# Patient Record
Sex: Female | Born: 1945 | Race: White | Hispanic: No | Marital: Married | Smoking: Former smoker
Health system: Southern US, Community
[De-identification: ages and names within clinical notes are randomized; demographics above are authoritative.]

## PROBLEM LIST (undated history)

## (undated) DIAGNOSIS — Z9889 Other specified postprocedural states: Secondary | ICD-10-CM

## (undated) DIAGNOSIS — F329 Major depressive disorder, single episode, unspecified: Secondary | ICD-10-CM

## (undated) DIAGNOSIS — E785 Hyperlipidemia, unspecified: Secondary | ICD-10-CM

## (undated) DIAGNOSIS — M7071 Other bursitis of hip, right hip: Secondary | ICD-10-CM

## (undated) DIAGNOSIS — D649 Anemia, unspecified: Secondary | ICD-10-CM

## (undated) DIAGNOSIS — N189 Chronic kidney disease, unspecified: Secondary | ICD-10-CM

## (undated) DIAGNOSIS — M722 Plantar fascial fibromatosis: Secondary | ICD-10-CM

## (undated) DIAGNOSIS — R112 Nausea with vomiting, unspecified: Secondary | ICD-10-CM

## (undated) DIAGNOSIS — M7072 Other bursitis of hip, left hip: Secondary | ICD-10-CM

## (undated) DIAGNOSIS — F32A Depression, unspecified: Secondary | ICD-10-CM

## (undated) DIAGNOSIS — H269 Unspecified cataract: Secondary | ICD-10-CM

## (undated) DIAGNOSIS — G709 Myoneural disorder, unspecified: Secondary | ICD-10-CM

## (undated) DIAGNOSIS — F419 Anxiety disorder, unspecified: Secondary | ICD-10-CM

## (undated) DIAGNOSIS — M1990 Unspecified osteoarthritis, unspecified site: Secondary | ICD-10-CM

## (undated) DIAGNOSIS — I10 Essential (primary) hypertension: Secondary | ICD-10-CM

## (undated) DIAGNOSIS — G4730 Sleep apnea, unspecified: Secondary | ICD-10-CM

## (undated) DIAGNOSIS — T7840XA Allergy, unspecified, initial encounter: Secondary | ICD-10-CM

## (undated) HISTORY — PX: SPINE SURGERY: SHX786

## (undated) HISTORY — DX: Chronic kidney disease, unspecified: N18.9

## (undated) HISTORY — DX: Other bursitis of hip, right hip: M70.72

## (undated) HISTORY — DX: Other bursitis of hip, right hip: M70.71

## (undated) HISTORY — DX: Hyperlipidemia, unspecified: E78.5

## (undated) HISTORY — DX: Depression, unspecified: F32.A

## (undated) HISTORY — DX: Unspecified cataract: H26.9

## (undated) HISTORY — PX: TONSILLECTOMY: SUR1361

## (undated) HISTORY — DX: Plantar fascial fibromatosis: M72.2

## (undated) HISTORY — PX: TUBAL LIGATION: SHX77

## (undated) HISTORY — DX: Myoneural disorder, unspecified: G70.9

## (undated) HISTORY — DX: Anemia, unspecified: D64.9

## (undated) HISTORY — DX: Sleep apnea, unspecified: G47.30

## (undated) HISTORY — DX: Unspecified osteoarthritis, unspecified site: M19.90

## (undated) HISTORY — PX: EYE SURGERY: SHX253

## (undated) HISTORY — DX: Essential (primary) hypertension: I10

## (undated) HISTORY — DX: Allergy, unspecified, initial encounter: T78.40XA

## (undated) HISTORY — DX: Anxiety disorder, unspecified: F41.9

---
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
## 1978-05-25 HISTORY — PX: BREAST SURGERY: SHX581

## 2007-05-26 LAB — HM COLONOSCOPY

## 2008-05-25 DIAGNOSIS — C801 Malignant (primary) neoplasm, unspecified: Secondary | ICD-10-CM

## 2008-05-25 HISTORY — PX: MELANOMA EXCISION: SHX5266

## 2008-05-25 HISTORY — DX: Malignant (primary) neoplasm, unspecified: C80.1

## 2010-04-08 LAB — HM MAMMOGRAPHY

## 2010-06-04 LAB — HM MAMMOGRAPHY: HM Mammogram: NORMAL (ref 0–4)

## 2010-06-16 LAB — HM DEXA SCAN

## 2011-12-16 LAB — HM MAMMOGRAPHY

## 2012-02-15 LAB — HM COLONOSCOPY

## 2012-05-25 HISTORY — PX: CERVICAL SPINE SURGERY: SHX589

## 2013-03-30 LAB — TSH: TSH: 2.2 (ref <=5.90)

## 2013-05-04 HISTORY — PX: BACK SURGERY: SHX140

## 2015-04-08 DIAGNOSIS — G4733 Obstructive sleep apnea (adult) (pediatric): Secondary | ICD-10-CM | POA: Insufficient documentation

## 2015-05-08 HISTORY — PX: CATARACT EXTRACTION: SUR2

## 2015-05-26 HISTORY — PX: CATARACT EXTRACTION, BILATERAL: SHX1313

## 2015-06-14 HISTORY — PX: CATARACT EXTRACTION: SUR2

## 2015-10-18 LAB — HM MAMMOGRAPHY

## 2016-10-29 LAB — HM MAMMOGRAPHY

## 2016-12-03 DIAGNOSIS — N183 Chronic kidney disease, stage 3 unspecified: Secondary | ICD-10-CM | POA: Insufficient documentation

## 2016-12-17 DIAGNOSIS — Z01810 Encounter for preprocedural cardiovascular examination: Secondary | ICD-10-CM | POA: Insufficient documentation

## 2016-12-17 DIAGNOSIS — R7303 Prediabetes: Secondary | ICD-10-CM | POA: Insufficient documentation

## 2016-12-17 HISTORY — DX: Encounter for preprocedural cardiovascular examination: Z01.810

## 2017-04-12 DIAGNOSIS — E663 Overweight: Secondary | ICD-10-CM | POA: Insufficient documentation

## 2017-09-29 DIAGNOSIS — N182 Chronic kidney disease, stage 2 (mild): Secondary | ICD-10-CM | POA: Insufficient documentation

## 2017-09-29 DIAGNOSIS — R809 Proteinuria, unspecified: Secondary | ICD-10-CM | POA: Insufficient documentation

## 2018-01-06 LAB — HM MAMMOGRAPHY

## 2018-03-16 DIAGNOSIS — Z23 Encounter for immunization: Secondary | ICD-10-CM | POA: Insufficient documentation

## 2018-07-24 DIAGNOSIS — M5136 Other intervertebral disc degeneration, lumbar region: Secondary | ICD-10-CM | POA: Insufficient documentation

## 2019-01-03 DIAGNOSIS — M792 Neuralgia and neuritis, unspecified: Secondary | ICD-10-CM | POA: Insufficient documentation

## 2019-01-03 DIAGNOSIS — M7072 Other bursitis of hip, left hip: Secondary | ICD-10-CM | POA: Insufficient documentation

## 2019-01-03 DIAGNOSIS — N186 End stage renal disease: Secondary | ICD-10-CM | POA: Insufficient documentation

## 2019-01-20 DIAGNOSIS — M48061 Spinal stenosis, lumbar region without neurogenic claudication: Secondary | ICD-10-CM | POA: Insufficient documentation

## 2019-03-14 ENCOUNTER — Other Ambulatory Visit: Payer: Self-pay | Admitting: Orthopedic Surgery

## 2019-03-14 DIAGNOSIS — M5136 Other intervertebral disc degeneration, lumbar region: Secondary | ICD-10-CM

## 2019-03-17 LAB — BASIC METABOLIC PANEL
CO2: 24 — AB (ref 13–22)
Chloride: 106 (ref 99–108)
Creatinine: 0.9 (ref <=1.1)
Glucose: 93
Potassium: 4.9 (ref 3.4–5.3)
Sodium: 143 (ref 137–147)

## 2019-03-17 LAB — HEMOGLOBIN A1C: Hemoglobin A1C: 5.8

## 2019-03-17 LAB — LIPID PANEL
Cholesterol: 169 (ref 0–200)
HDL: 65 (ref 35–70)
LDL Cholesterol: 84
Triglycerides: 116 (ref 40–160)

## 2019-03-17 LAB — HEPATIC FUNCTION PANEL
ALT: 17 (ref 7–35)
AST: 20 (ref 13–35)

## 2019-03-17 LAB — COMPREHENSIVE METABOLIC PANEL
Albumin: 4.3 (ref 3.5–5.0)
Calcium: 9.3 (ref 8.7–10.7)
GFR calc Af Amer: 75
GFR calc non Af Amer: 65
Globulin: 2.2

## 2019-03-17 LAB — MICROALBUMIN, URINE: Microalb, Ur: 3.3

## 2019-03-17 LAB — VITAMIN D 25 HYDROXY (VIT D DEFICIENCY, FRACTURES): Vit D, 25-Hydroxy: 29.8

## 2019-03-20 LAB — HM MAMMOGRAPHY: HM Mammogram: NORMAL (ref 0–4)

## 2019-03-21 DIAGNOSIS — M899 Disorder of bone, unspecified: Secondary | ICD-10-CM | POA: Insufficient documentation

## 2019-03-21 DIAGNOSIS — R7301 Impaired fasting glucose: Secondary | ICD-10-CM | POA: Insufficient documentation

## 2019-03-21 DIAGNOSIS — R7309 Other abnormal glucose: Secondary | ICD-10-CM | POA: Insufficient documentation

## 2019-03-21 DIAGNOSIS — M5090 Cervical disc disorder, unspecified, unspecified cervical region: Secondary | ICD-10-CM | POA: Insufficient documentation

## 2019-03-21 DIAGNOSIS — F32A Depression, unspecified: Secondary | ICD-10-CM | POA: Insufficient documentation

## 2019-03-21 DIAGNOSIS — E785 Hyperlipidemia, unspecified: Secondary | ICD-10-CM | POA: Insufficient documentation

## 2019-03-21 DIAGNOSIS — M26609 Unspecified temporomandibular joint disorder, unspecified side: Secondary | ICD-10-CM | POA: Insufficient documentation

## 2019-03-21 DIAGNOSIS — C4370 Malignant melanoma of unspecified lower limb, including hip: Secondary | ICD-10-CM | POA: Insufficient documentation

## 2019-03-21 DIAGNOSIS — R9439 Abnormal result of other cardiovascular function study: Secondary | ICD-10-CM | POA: Insufficient documentation

## 2019-03-21 DIAGNOSIS — R200 Anesthesia of skin: Secondary | ICD-10-CM | POA: Insufficient documentation

## 2019-03-21 DIAGNOSIS — E559 Vitamin D deficiency, unspecified: Secondary | ICD-10-CM | POA: Insufficient documentation

## 2019-03-21 DIAGNOSIS — R928 Other abnormal and inconclusive findings on diagnostic imaging of breast: Secondary | ICD-10-CM | POA: Insufficient documentation

## 2019-03-21 DIAGNOSIS — L821 Other seborrheic keratosis: Secondary | ICD-10-CM | POA: Insufficient documentation

## 2019-03-21 DIAGNOSIS — F329 Major depressive disorder, single episode, unspecified: Secondary | ICD-10-CM | POA: Insufficient documentation

## 2019-03-21 DIAGNOSIS — I8390 Asymptomatic varicose veins of unspecified lower extremity: Secondary | ICD-10-CM | POA: Insufficient documentation

## 2019-03-21 DIAGNOSIS — M8580 Other specified disorders of bone density and structure, unspecified site: Secondary | ICD-10-CM | POA: Insufficient documentation

## 2019-03-21 DIAGNOSIS — M519 Unspecified thoracic, thoracolumbar and lumbosacral intervertebral disc disorder: Secondary | ICD-10-CM | POA: Insufficient documentation

## 2019-04-04 ENCOUNTER — Ambulatory Visit
Admission: RE | Admit: 2019-04-04 | Discharge: 2019-04-04 | Disposition: A | Discharge status: Home / Self Care | Payer: Self-pay | Source: Ambulatory Visit | Attending: Orthopedic Surgery | Admitting: Orthopedic Surgery

## 2019-04-04 ENCOUNTER — Other Ambulatory Visit: Payer: Self-pay | Admitting: Orthopedic Surgery

## 2019-04-04 DIAGNOSIS — M5136 Other intervertebral disc degeneration, lumbar region: Secondary | ICD-10-CM

## 2019-04-05 ENCOUNTER — Other Ambulatory Visit: Payer: Self-pay

## 2019-04-05 ENCOUNTER — Ambulatory Visit
Admission: RE | Admit: 2019-04-05 | Discharge: 2019-04-05 | Disposition: A | Discharge status: Home / Self Care | Payer: Medicare Other | Source: Ambulatory Visit | Attending: Orthopedic Surgery | Admitting: Orthopedic Surgery

## 2019-04-05 DIAGNOSIS — M5136 Other intervertebral disc degeneration, lumbar region: Secondary | ICD-10-CM

## 2019-04-05 MED ORDER — IOPAMIDOL (ISOVUE-M 200) INJECTION 41%
1.0000 mL | Freq: Once | INTRAMUSCULAR | Status: AC
  Administered 2019-04-05: 1 mL

## 2019-04-05 MED ORDER — VANCOMYCIN HCL IN DEXTROSE 1-5 GM/200ML-% IV SOLN
1000.0000 mg | Freq: Once | INTRAVENOUS | Status: AC
  Administered 2019-04-05: 1000 mg via INTRAVENOUS

## 2019-04-05 NOTE — Discharge Instructions (Signed)

## 2019-09-27 ENCOUNTER — Other Ambulatory Visit: Payer: Self-pay

## 2019-09-27 ENCOUNTER — Ambulatory Visit (INDEPENDENT_AMBULATORY_CARE_PROVIDER_SITE_OTHER): Payer: Medicare Other | Admitting: Internal Medicine

## 2019-09-27 ENCOUNTER — Encounter: Payer: Self-pay | Admitting: Internal Medicine

## 2019-09-27 VITALS — BP 122/80 | HR 69 | Temp 98.3°F | Ht 65.0 in | Wt 182.0 lb

## 2019-09-27 DIAGNOSIS — E6609 Other obesity due to excess calories: Secondary | ICD-10-CM

## 2019-09-27 DIAGNOSIS — E7800 Pure hypercholesterolemia, unspecified: Secondary | ICD-10-CM

## 2019-09-27 DIAGNOSIS — R7303 Prediabetes: Secondary | ICD-10-CM

## 2019-09-27 DIAGNOSIS — M8588 Other specified disorders of bone density and structure, other site: Secondary | ICD-10-CM

## 2019-09-27 DIAGNOSIS — E559 Vitamin D deficiency, unspecified: Secondary | ICD-10-CM

## 2019-09-27 DIAGNOSIS — M48062 Spinal stenosis, lumbar region with neurogenic claudication: Secondary | ICD-10-CM | POA: Diagnosis not present

## 2019-09-27 DIAGNOSIS — L821 Other seborrheic keratosis: Secondary | ICD-10-CM

## 2019-09-27 DIAGNOSIS — M85859 Other specified disorders of bone density and structure, unspecified thigh: Secondary | ICD-10-CM

## 2019-09-27 DIAGNOSIS — Z85820 Personal history of malignant melanoma of skin: Secondary | ICD-10-CM

## 2019-09-27 DIAGNOSIS — Z6830 Body mass index (BMI) 30.0-30.9, adult: Secondary | ICD-10-CM

## 2019-09-27 DIAGNOSIS — C4370 Malignant melanoma of unspecified lower limb, including hip: Secondary | ICD-10-CM

## 2019-09-27 DIAGNOSIS — Z1231 Encounter for screening mammogram for malignant neoplasm of breast: Secondary | ICD-10-CM

## 2019-09-27 DIAGNOSIS — Z9889 Other specified postprocedural states: Secondary | ICD-10-CM

## 2019-09-27 DIAGNOSIS — G4733 Obstructive sleep apnea (adult) (pediatric): Secondary | ICD-10-CM

## 2019-09-27 DIAGNOSIS — Z9989 Dependence on other enabling machines and devices: Secondary | ICD-10-CM

## 2019-09-27 NOTE — Patient Instructions (Signed)
Seborrheic Keratosis °A seborrheic keratosis is a common, noncancerous (benign) skin growth. These growths are velvety, waxy, rough, tan, brown, or black spots that appear on the skin. These skin growths can be flat or raised, and scaly. °What are the causes? °The cause of this condition is not known. °What increases the risk? °You are more likely to develop this condition if you: °· Have a family history of seborrheic keratosis. °· Are 50 or older. °· Are pregnant. °· Have had estrogen replacement therapy. °What are the signs or symptoms? °Symptoms of this condition include growths on the face, chest, shoulders, back, or other areas. These growths: °· Are usually painless, but may become irritated and itchy. °· Can be yellow, brown, black, or other colors. °· Are slightly raised or have a flat surface. °· Are sometimes rough or wart-like in texture. °· Are often velvety or waxy on the surface. °· Are round or oval-shaped. °· Often occur in groups, but may occur as a single growth. °How is this diagnosed? °This condition is diagnosed with a medical history and physical exam. °· A sample of the growth may be tested (skin biopsy). °· You may need to see a skin specialist (dermatologist). °How is this treated? °Treatment is not usually needed for this condition, unless the growths are irritated or bleed often. °· You may also choose to have the growths removed if you do not like their appearance. °? Most commonly, these growths are treated with a procedure in which liquid nitrogen is applied to "freeze" off the growth (cryosurgery). °? They may also be burned off with electricity (electrocautery) or removed by scraping (curettage). °Follow these instructions at home: °· Watch your growth for any changes. °· Keep all follow-up visits as told by your health care provider. This is important. °· Do not scratch or pick at the growth or growths. This can cause them to become irritated or infected. °Contact a health care  provider if: °· You suddenly have many new growths. °· Your growth bleeds, itches, or hurts. °· Your growth suddenly becomes larger or changes color. °Summary °· A seborrheic keratosis is a common, noncancerous (benign) skin growth. °· Treatment is not usually needed for this condition, unless the growths are irritated or bleed often. °· Watch your growth for any changes. °· Contact a health care provider if you suddenly have many new growths or your growth suddenly becomes larger or changes color. °· Keep all follow-up visits as told by your health care provider. This is important. °This information is not intended to replace advice given to you by your health care provider. Make sure you discuss any questions you have with your health care provider. °Document Revised: 09/23/2017 Document Reviewed: 09/23/2017 °Elsevier Patient Education © 2020 Elsevier Inc. ° °

## 2019-09-27 NOTE — Progress Notes (Signed)
This visit occurred during the SARS-CoV-2 public health emergency.  Safety protocols were in place, including screening questions prior to the visit, additional usage of staff PPE, and extensive cleaning of exam room while observing appropriate contact time as indicated for disinfecting solutions.  Subjective:     Patient ID: Jenny [name] , female    DOB: [birth year] , 74 y.o.   MRN: [id number]   Chief Complaint  Patient presents with  . Establish Care  . [place]    wants it to be looked at    HPI  She is here today to establish primary care.  She reports she has been in [place] for almost 2 years. She states she came just prior to [place]. She moved from [place], [place].  She is married, 23 years. She is a retired Education officer, museum. She reports h/o stage 3 CKD, high cholesterol and sleep apnea. She reports compliance with CPAP, uses [employer] for DME supplies. She reports h/o melanoma, she has suspicious skin lesion and wants to have this checked.     Past Medical History:  Diagnosis Date  . Anxiety   . Arthritis   . Bursitis of both hips   . Cancer ([place]) 2010   Melanoma Left Arm  . Cataract [id number]  . Chronic kidney disease   . Depression   . Hyperlipidemia   . Hypertension   . Neuromuscular disorder ([place])    Peripheral Nueropathy ( Left Side)  . Plantar fasciitis   . Sleep apnea      Family History  Problem Relation Age of Onset  . COPD Mother   . Psychosis Mother   . COPD Father   . Depression Father   . Hypertension Father   . Lupus Daughter   . Depression Paternal Aunt   . Heart disease Paternal Grandmother      Current Outpatient Medications:  .  Acetaminophen (TYLENOL) 325 MG CAPS, Tylenol 325 mg capsule  Take by oral route., Disp: , Rfl:  .  Alpha-D-Galactosidase (BEANO) TABS, Beano tablet  Take by oral route., Disp: , Rfl:  .  atorvastatin (LIPITOR) 10 MG tablet, Lipitor 10 mg tablet  Take 1 tablet every day by oral route., Disp: , Rfl:  .  Calcium  Carbonate-Vitamin D 600-400 MG-UNIT tablet, 2 tablets 2 (two) times a day, Disp: , Rfl:  .  cholecalciferol (DELTA D3) 10 MCG (400 UNIT) TABS tablet, Take by mouth., Disp: , Rfl:  .  DENTA 5000 PLUS 1.1 % CREA dental cream, Place 1.1 % onto teeth one time only at 6 PM., Disp: , Rfl:  .  docusate sodium (COLACE) 100 MG capsule, Colace, Disp: , Rfl:  .  lactase (LACTAID) 3000 units tablet, Take by mouth., Disp: , Rfl:  .  PARoxetine (PAXIL) 30 MG tablet, Paxil 30 mg tablet  Take 1 tablet every day by oral route., Disp: , Rfl:    Allergies  Allergen Reactions  . Clindamycin Hives, Itching and Rash  . Prochlorperazine Other (See Comments)    Rapid heart rate, insomina, made her "super hyper"  . Diclofenac   . Ibandronic Acid   . Penicillins   . Raloxifene   . Diclofenac Sodium Other (See Comments)    Turns feces white  . Penicillin V Potassium Rash     Review of Systems  Constitutional: Negative.   HENT: Negative.   Eyes: Negative.   Respiratory: Negative.   Cardiovascular: Negative.   Endocrine: Negative.   Genitourinary: Negative.   Musculoskeletal: Positive for  back pain.  Allergic/Immunologic: Negative.   Neurological: Negative.   Hematological: Negative.   Psychiatric/Behavioral: Negative.      Today's Vitals   09/27/19 1113  BP: 122/80  Pulse: 69  Temp: 98.3 F (36.8 C)  Weight: 182 lb (82.6 kg)  Height: '5\' 5"'  (1.651 m)  PainSc: 0-No pain   Body mass index is 30.29 kg/m.   Objective:  Physical Exam Vitals and nursing note reviewed.  Constitutional:      Appearance: Normal appearance.  HENT:     Head: Normocephalic and atraumatic.  Cardiovascular:     Rate and Rhythm: Normal rate and regular rhythm.     Heart sounds: Normal heart sounds.  Pulmonary:     Effort: Pulmonary effort is normal.     Breath sounds: Normal breath sounds.  Skin:    General: Skin is warm.     Comments: Hyperpigmented, irregular, plaque lesion on back.   Neurological:      General: No focal deficit present.     Mental Status: She is alert.  Psychiatric:        Mood and Affect: Mood normal.        Behavior: Behavior normal.         Assessment And Plan:     1. Seborrheic keratosis  I will refer her to Derm for further evaluation, surgical excision. She is in agreement with her treatment plan. She is concerned b/c h/o melanoma.   - Ambulatory referral to Dermatology  2. Spinal stenosis of lumbar region with neurogenic claudication  Chronic, yet stable. She has had physical therapy. She is now followed by Dr. Rolena Infante at [facility].   3. Pure hypercholesterolemia  Chronic, labs from previous PCP reviewed in full detail.  She will continue with current meds for now.  I will check non-fasting lipid panel today. She is encouraged to avoid fried foods and to gradually increase daily activity as tolerated.   - Lipid panel - CBC no Diff - TSH  4. Prediabetes  Again, her previous labs were reviewed in full detail. I will have most recent labs abstracted into her chart.  HER A1C HAS BEEN ELEVATED IN THE PAST. I WILL CHECK AN A1C, BMET TODAY. SHE WAS ENCOURAGED TO AVOID SUGARY BEVERAGES AND PROCESSED FOODS INCLUDNG BREADS, RICE AND PASTA.  - Hemoglobin A1c - CMP14+EGFR  5. OSA on CPAP  Chronic, she reports compliance with CPAP. She states she wears faithfully nightly and has noticed benefit from the use of CPAP.   6. Osteopenia of hip, unspecified laterality  Previous dexa scan performed in 2019, she agrees to repeat testing. Importance of calcium/vitamin D supplementation and weight-bearing exercises was discussed.   - DG Bone Density; Future  7. Vitamin D deficiency disease  I WILL CHECK A VIT D LEVEL AND SUPPLEMENT AS NEEDED.  ALSO ENCOURAGED TO SPEND 15 MINUTES IN THE SUN DAILY.  - Vitamin D (25 hydroxy)  8. Class 1 obesity due to excess calories with serious comorbidity and body mass index (BMI) of 30.0 to 30.9 in adult  Her BMI is stable  for her demographic. Encouraged to work up to exercising 30 minutes five days per week.   9. History of melanoma excision  She agrees to [facility] referral for full body check.   - Ambulatory referral to Dermatology  10. Encounter for screening mammogram for malignant neoplasm of breast  - MM Digital Screening; Future  11. Other specified disorders of bone density and structure, other site   -  DG Bone Density; Future   Maximino Greenland, MD    THE PATIENT IS ENCOURAGED TO PRACTICE SOCIAL DISTANCING DUE TO THE COVID-19 PANDEMIC.

## 2019-09-28 LAB — CMP14+EGFR
ALT: 17 IU/L (ref 0–32)
AST: 23 IU/L (ref 0–40)
Albumin/Globulin Ratio: 1.7 (ref 1.2–2.2)
Albumin: 4.5 g/dL (ref 3.7–4.7)
Alkaline Phosphatase: 85 IU/L (ref 39–117)
BUN/Creatinine Ratio: 27 (ref 12–28)
BUN: 24 mg/dL (ref 8–27)
Bilirubin Total: 0.3 mg/dL (ref 0.0–1.2)
CO2: 26 mmol/L (ref 20–29)
Calcium: 9.5 mg/dL (ref 8.7–10.3)
Chloride: 103 mmol/L (ref 96–106)
Creatinine, Ser: 0.9 mg/dL (ref 0.57–1.00)
GFR calc Af Amer: 73 mL/min/{1.73_m2} (ref >59)
GFR calc non Af Amer: 64 mL/min/{1.73_m2} (ref >59)
Globulin, Total: 2.6 g/dL (ref 1.5–4.5)
Glucose: 83 mg/dL (ref 65–99)
Potassium: 4.4 mmol/L (ref 3.5–5.2)
Sodium: 141 mmol/L (ref 134–144)
Total Protein: 7.1 g/dL (ref 6.0–8.5)

## 2019-09-28 LAB — LIPID PANEL
Chol/HDL Ratio: 2.7 ratio (ref 0.0–4.4)
Cholesterol, Total: 180 mg/dL (ref 100–199)
HDL: 67 mg/dL (ref >39)
LDL Chol Calc (NIH): 88 mg/dL (ref 0–99)
Triglycerides: 145 mg/dL (ref 0–149)
VLDL Cholesterol Cal: 25 mg/dL (ref 5–40)

## 2019-09-28 LAB — CBC
Hematocrit: 39.7 % (ref 34.0–46.6)
Hemoglobin: 13.3 g/dL (ref 11.1–15.9)
MCH: 31.1 pg (ref 26.6–33.0)
MCHC: 33.5 g/dL (ref 31.5–35.7)
MCV: 93 fL (ref 79–97)
Platelets: 256 10*3/uL (ref 150–450)
RBC: 4.27 x10E6/uL (ref 3.77–5.28)
RDW: 12.9 % (ref 11.7–15.4)
WBC: 6.1 10*3/uL (ref 3.4–10.8)

## 2019-09-28 LAB — VITAMIN D 25 HYDROXY (VIT D DEFICIENCY, FRACTURES): Vit D, 25-Hydroxy: 33.6 ng/mL (ref 30.0–100.0)

## 2019-09-28 LAB — TSH: TSH: 2.47 u[IU]/mL (ref 0.450–4.500)

## 2019-09-28 LAB — HEMOGLOBIN A1C
Est. average glucose Bld gHb Est-mCnc: 123 mg/dL
Hgb A1c MFr Bld: 5.9 % — ABNORMAL HIGH (ref 4.8–5.6)

## 2020-02-20 ENCOUNTER — Other Ambulatory Visit: Payer: Self-pay

## 2020-02-20 ENCOUNTER — Ambulatory Visit (INDEPENDENT_AMBULATORY_CARE_PROVIDER_SITE_OTHER): Payer: Medicare Other | Admitting: Internal Medicine

## 2020-02-20 ENCOUNTER — Encounter: Payer: Self-pay | Admitting: Internal Medicine

## 2020-02-20 VITALS — BP 112/74 | HR 75 | Temp 98.1°F | Ht 65.0 in | Wt 180.2 lb

## 2020-02-20 DIAGNOSIS — Z01818 Encounter for other preprocedural examination: Secondary | ICD-10-CM

## 2020-02-20 DIAGNOSIS — N182 Chronic kidney disease, stage 2 (mild): Secondary | ICD-10-CM | POA: Diagnosis not present

## 2020-02-20 DIAGNOSIS — Z9989 Dependence on other enabling machines and devices: Secondary | ICD-10-CM

## 2020-02-20 DIAGNOSIS — R0602 Shortness of breath: Secondary | ICD-10-CM

## 2020-02-20 DIAGNOSIS — M48062 Spinal stenosis, lumbar region with neurogenic claudication: Secondary | ICD-10-CM | POA: Diagnosis not present

## 2020-02-20 DIAGNOSIS — G4733 Obstructive sleep apnea (adult) (pediatric): Secondary | ICD-10-CM

## 2020-02-20 LAB — CBC
Hematocrit: 39.8 % (ref 34.0–46.6)
Hemoglobin: 13.1 g/dL (ref 11.1–15.9)
MCH: 30.3 pg (ref 26.6–33.0)
MCHC: 32.9 g/dL (ref 31.5–35.7)
MCV: 92 fL (ref 79–97)
Platelets: 240 10*3/uL (ref 150–450)
RBC: 4.33 x10E6/uL (ref 3.77–5.28)
RDW: 12.9 % (ref 11.7–15.4)
WBC: 6.5 10*3/uL (ref 3.4–10.8)

## 2020-02-20 MED ORDER — PAROXETINE HCL 30 MG PO TABS: ORAL_TABLET | ORAL | 1 refills | Status: DC

## 2020-02-20 MED ORDER — DENTA 5000 PLUS 1.1 % DT CREA: 1.0000 "application " | TOPICAL_CREAM | Freq: Every day | DENTAL | 3 refills | Status: DC

## 2020-02-20 MED ORDER — ATORVASTATIN CALCIUM 10 MG PO TABS: ORAL_TABLET | ORAL | 1 refills | Status: DC

## 2020-02-20 NOTE — Progress Notes (Signed)
I,Katawbba Wiggins,acting as a Education administrator for Maximino Greenland, MD.,have documented all relevant documentation on the behalf of Maximino Greenland, MD,as directed by  Maximino Greenland, MD while in the presence of Maximino Greenland, MD.  This visit occurred during the SARS-CoV-2 public health emergency.  Safety protocols were in place, including screening questions prior to the visit, additional usage of staff PPE, and extensive cleaning of exam room while observing appropriate contact time as indicated for disinfecting solutions.  Subjective:     Patient ID: Jenny [name] , female    DOB: Jun 17, 1945 , 74 y.o.   MRN: [id number]   Chief Complaint  Patient presents with  . Medical Clearance    HPI  The patient is here today for evaluation of surgical clearance.  She is followed by Dr. Melina Schools for spinal stenosis. She is hoping the surgical procedure will lessen her sx of chronic back pain that radiates down her left leg.   She does not have stairs in her home. She does admit to shortness of breath when walking to her mailbox. She denies chest pain. Unable to recall if she has had cardiac workup in the past. She denies orthopnea. She does not think SOB is bothersome, thinks its b/c she doesn't move around much due to the back pain.     Past Medical History:  Diagnosis Date  . Anxiety   . Arthritis   . Bursitis of both hips   . Cancer ([place]) 2010   Melanoma Left Arm  . Cataract [id number]  . Chronic kidney disease   . Depression   . Hyperlipidemia   . Hypertension   . Neuromuscular disorder ([place])    Peripheral Nueropathy ( Left Side)  . Plantar fasciitis   . Sleep apnea      Family History  Problem Relation Age of Onset  . COPD Mother   . Psychosis Mother   . COPD Father   . Depression Father   . Hypertension Father   . Lupus Daughter   . Depression Paternal Aunt   . Heart disease Paternal Grandmother      Current Outpatient Medications:  .  Acetaminophen (TYLENOL) 325 MG  CAPS, Tylenol 325 mg capsule  Take by oral route., Disp: , Rfl:  .  Alpha-D-Galactosidase (BEANO) TABS, Beano tablet  Take by oral route., Disp: , Rfl:  .  Calcium Carbonate-Vitamin D 600-400 MG-UNIT tablet, 2 tablets 2 (two) times a day, Disp: , Rfl:  .  cholecalciferol (DELTA D3) 10 MCG (400 UNIT) TABS tablet, Take by mouth., Disp: , Rfl:  .  DENTA 5000 PLUS 1.1 % CREA dental cream, Place 1 application onto teeth at bedtime., Disp: 51 g, Rfl: 3 .  docusate sodium (COLACE) 100 MG capsule, Colace, Disp: , Rfl:  .  lactase (LACTAID) 3000 units tablet, Take by mouth., Disp: , Rfl:  .  atorvastatin (LIPITOR) 10 MG tablet, Take 1 tablet every day by oral route., Disp: 90 tablet, Rfl: 1 .  PARoxetine (PAXIL) 30 MG tablet, Take 1 tablet every day by oral route., Disp: 90 tablet, Rfl: 1   Allergies  Allergen Reactions  . Clindamycin Hives, Itching and Rash  . Prochlorperazine Other (See Comments)    Rapid heart rate, insomina, made her "super hyper"  . Diclofenac   . Ibandronic Acid   . Penicillins   . Raloxifene   . Diclofenac Sodium Other (See Comments)    Turns feces white  . Penicillin V Potassium Rash  Review of Systems  Constitutional: Negative.   Respiratory: Negative.   Cardiovascular: Negative.   Gastrointestinal: Negative.   Musculoskeletal: Positive for back pain.  Psychiatric/Behavioral: Negative.   All other systems reviewed and are negative.    Today's Vitals   02/20/20 1409  BP: 112/74  Pulse: 75  Temp: 98.1 F (36.7 C)  TempSrc: Oral  Weight: 180 lb 3.2 oz (81.7 kg)  Height: 5\' 5"  (1.651 m)  PainSc: 0-No pain   Body mass index is 29.99 kg/m.   Objective:  Physical Exam Vitals and nursing note reviewed.  Constitutional:      Appearance: Normal appearance. She is obese.  HENT:     Head: Normocephalic and atraumatic.  Cardiovascular:     Rate and Rhythm: Normal rate and regular rhythm.     Heart sounds: Normal heart sounds.  Pulmonary:     Breath  sounds: Normal breath sounds.  Skin:    General: Skin is warm.  Neurological:     General: No focal deficit present.     Mental Status: She is alert and oriented to person, place, and time.         Assessment And Plan:     1. Spinal stenosis of lumbar region with neurogenic claudication Comments: Chronic, as per Ortho.   2. Pre-operative clearance Comments: EKG performed, NSR w/ LAE. Unfortunately, I do not have any other ECG to compare. I will request medical records from previous provider.  - EKG 12-Lead - CBC no Diff  3. Shortness of breath Comments: Possibly due to deconditioning. However, due to upcoming surgery, I would like for her to proceed with Cardiology evaluation. She reluctantly agrees.  - EKG 12-Lead - CBC no Diff  4. Chronic kidney disease, stage II (mild) Comments: Chronic, previously had CKD stage 3. This has improved over time. Has been under the care of Nephrology. Encouraged to stay well hydrated, limit NSAIDS.   5. OSA on CPAP Comments: Chronic, stable on CPAP. Encouraged to wear mask at least 4 hours per night.      Patient was given opportunity to ask questions. Patient verbalized understanding of the plan and was able to repeat key elements of the plan. All questions were answered to their satisfaction.  Maximino Greenland, MD   I, Maximino Greenland, MD, have reviewed all documentation for this visit. The documentation on 02/25/20 for the exam, diagnosis, procedures, and orders are all accurate and complete.  THE PATIENT IS ENCOURAGED TO PRACTICE SOCIAL DISTANCING DUE TO THE COVID-19 PANDEMIC.

## 2020-02-22 ENCOUNTER — Telehealth: Payer: Self-pay

## 2020-02-22 NOTE — Telephone Encounter (Signed)
The patient was asked what was her cardiac workup for at [facility].  The pt said that she couldn't remember, the pt is coming today to sign a release form to be faxed over to request the records.

## 2020-02-26 ENCOUNTER — Telehealth: Payer: Self-pay

## 2020-02-26 NOTE — Telephone Encounter (Signed)
pt said yes she came to sign the medical records request last week and that she agrees to cxr and cardiac eval this week.

## 2020-02-26 NOTE — Telephone Encounter (Signed)
Did she come to sign medical record request?  Dr. Baird Cancer wants the pt to know that she spoke with Dr. Rolena Infante - he agrees with workup prior to surgery. Is she agreeable for CXR and cardiac eval this week?

## 2020-02-28 ENCOUNTER — Other Ambulatory Visit: Payer: Self-pay | Admitting: Internal Medicine

## 2020-02-28 DIAGNOSIS — Z01818 Encounter for other preprocedural examination: Secondary | ICD-10-CM

## 2020-02-28 DIAGNOSIS — R0602 Shortness of breath: Secondary | ICD-10-CM

## 2020-02-29 ENCOUNTER — Ambulatory Visit
Admission: RE | Admit: 2020-02-29 | Discharge: 2020-02-29 | Disposition: A | Discharge status: Home / Self Care | Payer: Medicare Other | Source: Ambulatory Visit | Attending: Internal Medicine | Admitting: Internal Medicine

## 2020-02-29 ENCOUNTER — Other Ambulatory Visit: Payer: Self-pay

## 2020-02-29 ENCOUNTER — Telehealth: Payer: Self-pay

## 2020-02-29 DIAGNOSIS — R0602 Shortness of breath: Secondary | ICD-10-CM

## 2020-02-29 NOTE — Telephone Encounter (Signed)
I called the pt to give her [employer] number, her husband said that he would have her give the office a call back.

## 2020-03-07 DIAGNOSIS — Z01818 Encounter for other preprocedural examination: Secondary | ICD-10-CM | POA: Insufficient documentation

## 2020-03-07 DIAGNOSIS — R9431 Abnormal electrocardiogram [ECG] [EKG]: Secondary | ICD-10-CM | POA: Insufficient documentation

## 2020-03-07 NOTE — Progress Notes (Signed)
Patient referred by Glendale Chard, MD for abnormal EKG  Subjective:   Jenny [name], female    DOB: Jan 14, 1946, 74 y.o.   MRN: [id number]   Chief Complaint  Patient presents with   Shortness of Breath   Medical Clearance   New Patient (Initial Visit)   Abnormal ECG     HPI  74 y.o. Caucasian female with abnormal EKG, pre-op risk stratification.  Patient is going to undergo spinal surgery, owing to severe back pain. She is not abl to walk for longer than few feet, without having limiting back pain. Her symptoms started in April 2020. For last two months, she has experienced exertional dyspnea, denies chest pain. She had a stress test several years ago.   Past Medical History:  Diagnosis Date   Anxiety    Arthritis    Bursitis of both hips    Cancer ([place]) 2010   Melanoma Left Arm   Cataract 2016,2017   Chronic kidney disease    Depression    Hyperlipidemia    Hypertension    Neuromuscular disorder ([place])    Peripheral Nueropathy ( Left Side)   Plantar fasciitis    Sleep apnea      Past Surgical History:  Procedure Laterality Date   BACK SURGERY  05/04/2013   Cervical Discectomy with Fusion by Dana   fibroadenoma   CATARACT EXTRACTION Left 06/14/2015   CATARACT EXTRACTION Right 05/08/2015   CATARACT EXTRACTION, BILATERAL  2017   CERVICAL SPINE SURGERY  2014   spinal fusion   MELANOMA EXCISION Left 2010   left arm   TUBAL LIGATION       Social History   Tobacco Use  Smoking Status Former Smoker   Types: Cigarettes   Quit date: 09/26/1968   Years since quitting: 51.4  Smokeless Tobacco Never Used    Social History   Substance and Sexual Activity  Alcohol Use Yes   Alcohol/week: 2.0 standard drinks   Types: 1 Glasses of wine, 1 Standard drinks or equivalent per week   Comment: Once a Week     Family History  Problem Relation Age of Onset   COPD Mother    Psychosis Mother     COPD Father    Depression Father    Hypertension Father    Lupus Daughter    Depression Paternal Aunt    Heart disease Paternal Grandmother      Current Outpatient Medications on File Prior to Visit  Medication Sig Dispense Refill   Acetaminophen (TYLENOL) 325 MG CAPS Tylenol 325 mg capsule  Take by oral route.     Alpha-D-Galactosidase (BEANO) TABS Beano tablet  Take by oral route.     atorvastatin (LIPITOR) 10 MG tablet Take 1 tablet every day by oral route. 90 tablet 1   Calcium Carbonate-Vitamin D 600-400 MG-UNIT tablet 2 tablets 2 (two) times a day     cholecalciferol (DELTA D3) 10 MCG (400 UNIT) TABS tablet Take by mouth.     DENTA 5000 PLUS 1.1 % CREA dental cream Place 1 application onto teeth at bedtime. 51 g 3   docusate sodium (COLACE) 100 MG capsule Colace     lactase (LACTAID) 3000 units tablet Take by mouth.     PARoxetine (PAXIL) 30 MG tablet Take 1 tablet every day by oral route. 90 tablet 1   No current facility-administered medications on file prior to visit.    Cardiovascular and other pertinent studies:  EKG 03/08/2020:  Sinus rhythm 75 bpm Cannot exclude old inferior infarct Nonspecific ST-T changes anteroseptal leads  EKG 02/20/2020: Sinus rhythm 66 bpm Normal EKG  Recent labs: 09/27/2019: Glucose 83, BUN/Cr 24/0.9. EGFR 61. Na/K 141/4.4. Rest of the CMP normal H/H 13/39. MCV 92. Platelets 240 HbA1C 5.9% Chol 180, TG 145, HDL 67, LDL 88 TSH 2.4 normal    Review of Systems  Cardiovascular: Positive for dyspnea on exertion. Negative for chest pain, leg swelling, palpitations and syncope.  Musculoskeletal: Positive for back pain.         Vitals:   03/08/20 1247  BP: 129/80  Pulse: 76  SpO2: 94%     Body mass index is 29.79 kg/m. Filed Weights   03/08/20 1247  Weight: 179 lb (81.2 kg)     Objective:   Physical Exam Vitals and nursing note reviewed.  Constitutional:      General: She is not in acute distress. Neck:      Vascular: No JVD.  Cardiovascular:     Rate and Rhythm: Normal rate and regular rhythm.     Heart sounds: Normal heart sounds. No murmur heard.   Pulmonary:     Effort: Pulmonary effort is normal.     Breath sounds: Normal breath sounds. No wheezing or rales.         Assessment & Recommendations:   74 y.o. Caucasian female with abnormal EKG, pre-op risk stratification.  Pre-op risk stratification: Poor baseline functional capacity owing to back pain. Nonspecific abnormalities on EKG. Will obtain echocardiogram and lexiscan nuclear stress test. If no significant abnormalities, may proceed with surgery with low cardiac risk.   Thank you for referring the patient to [place]. Please feel free to contact with any questions.   Nigel Mormon, MD Pager: [phone] Office: [phone]

## 2020-03-08 ENCOUNTER — Encounter: Payer: Self-pay | Admitting: Cardiology

## 2020-03-08 ENCOUNTER — Ambulatory Visit: Payer: Medicare Other | Admitting: Cardiology

## 2020-03-08 ENCOUNTER — Other Ambulatory Visit: Payer: Self-pay

## 2020-03-08 VITALS — BP 129/80 | HR 76 | Ht 65.0 in | Wt 179.0 lb

## 2020-03-08 DIAGNOSIS — Z01810 Encounter for preprocedural cardiovascular examination: Secondary | ICD-10-CM

## 2020-03-08 DIAGNOSIS — R9431 Abnormal electrocardiogram [ECG] [EKG]: Secondary | ICD-10-CM

## 2020-03-13 ENCOUNTER — Ambulatory Visit: Payer: Medicare Other

## 2020-03-13 ENCOUNTER — Other Ambulatory Visit: Payer: Self-pay

## 2020-03-13 DIAGNOSIS — Z01810 Encounter for preprocedural cardiovascular examination: Secondary | ICD-10-CM

## 2020-03-13 DIAGNOSIS — R9431 Abnormal electrocardiogram [ECG] [EKG]: Secondary | ICD-10-CM

## 2020-03-15 ENCOUNTER — Ambulatory Visit: Payer: Medicare Other

## 2020-03-15 ENCOUNTER — Telehealth: Payer: Self-pay

## 2020-03-15 ENCOUNTER — Encounter: Payer: Self-pay | Admitting: Internal Medicine

## 2020-03-15 ENCOUNTER — Other Ambulatory Visit: Payer: Self-pay

## 2020-03-15 DIAGNOSIS — Z01810 Encounter for preprocedural cardiovascular examination: Secondary | ICD-10-CM

## 2020-03-15 DIAGNOSIS — R9431 Abnormal electrocardiogram [ECG] [EKG]: Secondary | ICD-10-CM

## 2020-03-15 NOTE — Telephone Encounter (Signed)
Patient notified chest xray was normal.

## 2020-03-15 NOTE — Telephone Encounter (Signed)
-----   Message from Glendale Chard, MD sent at 03/04/2020 10:29 PM EDT ----- CXR: normal

## 2020-03-18 ENCOUNTER — Telehealth: Payer: Self-pay

## 2020-03-18 NOTE — Progress Notes (Signed)
Relayed information to patient regarding test results. Pt requesting surgical clearance letter be faxed to Melina Schools @ [facility].

## 2020-03-18 NOTE — Telephone Encounter (Signed)
-----   Message from [facility], MD sent at 03/18/2020  9:11 AM EDT ----- Normal heart function. Mild to moderate valve leakiness, should not hinder with surgery plans. No follow up necessary for the valve leakiness, unless new symptoms of shortness of breath or leg edema develop in the future.  Will need surgery clearance letter.  Thanks MJP

## 2020-03-18 NOTE — Telephone Encounter (Signed)
Relayed information to patient regarding test results. Patient voiced understanding. Sent Provider message regarding surgical clearance.

## 2020-03-19 ENCOUNTER — Encounter: Payer: Self-pay | Admitting: Internal Medicine

## 2020-03-29 ENCOUNTER — Ambulatory Visit: Payer: Self-pay | Admitting: Orthopedic Surgery

## 2020-04-02 ENCOUNTER — Ambulatory Visit: Payer: Self-pay | Admitting: Orthopedic Surgery

## 2020-04-02 NOTE — H&P (Signed)
Subjective:   Jenny [name] a pleasant 74 year old female who has had over 1 year of low back, buttock and radicular left leg pain. Her pain continues in the to her fears with her quality-of-life despite appropriate conservative care Including over-the-counter medications, physical therapy, and injection therapy. Imaging studies as well as diagnostic injections have confirmed her source of pain and after discussion of risks and benefits of surgery, the patient has elected to move forward with surgical intervention. She [name] scheduleed for TLIF l5-S1, L4-L5 decompression at [facility] on 04/11/2020 with Dr. Rolena [name].  Patient Active Problem List   Diagnosis Date Noted  . Pre-op evaluation 03/07/2020  . Abnormal EKG 03/07/2020  . Blood glucose abnormal 03/21/2019  . Cardiovascular stress test abnormal 03/21/2019  . Cervical disc disorder 03/21/2019  . Disc disorder of lumbar region 03/21/2019  . Depressive disorder 03/21/2019  . Disorder of bone and articular cartilage 03/21/2019  . Hyperlipidemia 03/21/2019  . Impaired fasting glucose 03/21/2019  . Malignant melanoma of skin of lower limb ([place]) 03/21/2019  . Mammogram abnormal 03/21/2019  . Numbness of lower limb 03/21/2019  . Osteopenia 03/21/2019  . Seborrheic keratosis 03/21/2019  . 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 Varicose veins of lower extremity 03/21/2019  . Vitamin D deficiency 03/21/2019  . Spinal stenosis of lumbar region 01/20/2019  . Bursitis of left hip 01/03/2019  . Peripheral neuropathic pain 01/03/2019  . 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  . Need for immunization against influenza 03/16/2018  . Proteinuria 09/29/2017  . Chronic kidney disease, stage 2 (mild) 09/29/2017  . Overweight (BMI 25.0-29.9) 04/12/2017  . Preop cardiovascular exam 12/17/2016  . Prediabetes 12/17/2016  . Stage 3 chronic kidney disease ([place]) 12/03/2016  . Obstructive sleep apnea syndrome 04/08/2015   Past  Medical History:  Diagnosis Date  . Anxiety   . Arthritis   . Bursitis of both hips   . Cancer ([place]) 2010   Melanoma Left Arm  . Cataract [id number]  . Chronic kidney disease   . Depression   . Hyperlipidemia   . Hypertension   . Neuromuscular disorder ([place])    Peripheral Nueropathy ( Left Side)  . Plantar fasciitis   . Sleep apnea     Past Surgical History:  Procedure Laterality Date  . BACK SURGERY  05/04/2013   Cervical Discectomy with Fusion by Dr.McGovern  . BREAST SURGERY  1980   fibroadenoma  . CATARACT EXTRACTION Left 06/14/2015  . CATARACT EXTRACTION Right 05/08/2015  . CATARACT EXTRACTION, BILATERAL  2017  . CERVICAL SPINE SURGERY  2014   spinal fusion  . MELANOMA EXCISION Left 2010   left arm  . TUBAL LIGATION      Current Outpatient Medications  Medication Sig Dispense Refill Last Dose  . Acetaminophen (TYLENOL) 325 MG CAPS Tylenol 325 mg capsule  Take by oral route.     . Alpha-D-Galactosidase (BEANO) TABS Beano tablet  Take by oral route.     Marland Kitchen atorvastatin (LIPITOR) 10 MG tablet Take 1 tablet every day by oral route. 90 tablet 1   . Calcium Carbonate-Vitamin D 600-400 MG-UNIT tablet 2 tablets 2 (two) times a day     . cholecalciferol (DELTA D3) 10 MCG (400 UNIT) TABS tablet Take by mouth.     . DENTA 5000 PLUS 1.1 % CREA dental cream Place 1 application onto teeth at bedtime. 51 g 3   . diphenhydrAMINE (BENADRYL) 25 mg capsule Take 25 mg by mouth every 6 (six) hours as needed for  sleep.     . docusate sodium (COLACE) 100 MG capsule Colace     . lactase (LACTAID) 3000 units tablet Take by mouth.     Marland Kitchen PARoxetine (PAXIL) 30 MG tablet Take 1 tablet every day by oral route. 90 tablet 1    No current facility-administered medications for this visit.   Allergies  Allergen Reactions  . Clindamycin Hives, Itching and Rash  . Prochlorperazine Other (See Comments)    Rapid heart rate, insomina, made her "super hyper"  . Diclofenac   . Ibandronic Acid   .  Penicillins   . Raloxifene   . Diclofenac Sodium Other (See Comments)    Turns feces white  . Penicillin V Potassium Rash    Social History   Tobacco Use  . Smoking status: Former Smoker    Types: Cigarettes    Quit date: 09/26/1968    Years since quitting: 51.5  . Smokeless tobacco: Never Used  Substance Use Topics  . Alcohol use: Yes    Alcohol/week: 2.0 standard drinks    Types: 1 Glasses of wine, 1 Standard drinks or equivalent per week    Comment: Once a Week    Family History  Problem Relation Age of Onset  . COPD Mother   . Psychosis Mother   . COPD Father   . Depression Father   . Hypertension Father   . Lupus Daughter   . Depression Paternal Aunt   . Heart disease Paternal Grandmother     Review of Systems As stated in HPI  Objective:   Vitals: Ht: 5 ft 5 in 04/02/2020 10:17 am BP: 110/70 R arm 04/02/2020 10:18 am Pulse: 70 bpm 04/02/2020 10:19 am  Clinical exam: Patient [name] alert and oriented 3. No shortness of breath, chest pain.  Heart: Regular rate and rhythm, no rubs, murmurs, or gallops  Lungs: Clear auscultation bilaterally  Abdomen: Soft and nontender, no rebound tenderness, no incontinence of bowel and bladder. Bowel sounds 4  Lumbar spine: Ongoing significant low back pain. Low back pain [name] worse with flexion and rotation. Pain radiates into the left buttock region. No SI joint pain, negative Faber test  Neuro: Positive dysesthesias into the left lower extremity. Negative nerve root tension sign. Symmetrical 1+ deep tendon reflexes at the knee and Achilles Reflexes: Babinski: Negative  Xrays of the lumbar spine demonstrate collapse of the L5-S1 disc space. Trace scoliosis in the lower lumbar spine. No fracture [name] seen.  Lumbar MRI: completed on 01/17/20 was reviewed with the patient. [facility] was [facility]; I have independently reviewed the images as well as the radiology report. Moderate left foraminal stenosis as well as lateral  recess stenosis. Essentially unchanged from prior MRI. Right foraminal stenosis has mildly progressed. Stable multilevel central stenosis L4-5. Advanced collapse and degeneration of the L5-S1 disc space.  Assessment:   Jenny [name] a pleasant 74 year old female who [name] here today for her preop H&P Her overall quality-of-life his continued to deteriorate given her back buttock and neuropathic left leg pain. Imaging studies confirm degenerative lumbar disc disease at L5-S1 and lateral recess and central stenosis at L4-5. Although there [name] increased disease on the right sided L4-5 clinically her symptoms are all left-sided. At this point time based on her injection history and clinical complaints I believe the back pain [name] primarily due to the L5-S1 degenerative disc disease and the radicular leg pain [name] most likely due to the stenosis at L4-5. In order to address both I think it  [name] reasonable to move forward with theTLIF at L5-S1 with a left sided laminotomy and decompression at L4-5. I have gone over the surgical procedure in great detail with the patient and all of her questions were encouraged and addressed.  Plan:   L4-5 decompression and L5-S1 TLIF  Risks and benefits of spinal fusion: Infection, bleeding, death, stroke, paralysis, ongoing or worse pain, need for additional surgery, nonunion, leak of spinal fluid, adjacent segment degeneration requiring additional fusion surgery, Injury to abdominal vessels that can require anterior surgery to stop bleeding. Malposition of the cage and/or pedicle screws that could require additional surgery. Loss of bowel and bladder control. Postoperative hematoma causing neurologic compression that could require urgent or emergent re-operation. Risks and benefits of decompression/discectomy: Infection, bleeding, death, stroke, paralysis, ongoing or worse pain, need for additional surgery, leak of spinal fluid, adjacent segment degeneration requiring additional surgery,  post-operative hematoma formation that can result in neurological compromise and the need for urgent/emergent re-operation. Loss in bowel and bladder control. Injury to major vessels that could result in the need for urgent abdominal surgery to stop bleeding. Risk of deep venous thrombosis (DVT) and the need for additional treatment. Recurrent disc herniation resulting in the need for revision surgery, which could include fusion surgery.  We have obtained preoperative medical clearance from the patient's cardiologist. We do have a clearance form from the primary care provider that recommended the patient get seen by her cardiologist, patient states she has gotten verbal clearance from her PCP, but we will reach out to the PCP to get documentation.  I have reviewed the patient's medication list with her. She [name] not on any blood thinners. She does not take any aspirin. She [name] not using any anti-inflammatory medications.  We have also discussed the post-operative recovery period to include: bathing/showering restrictions, wound healing, activity (and driving) restrictions, medications/pain mangement.  We have also discussed post-operative redflags to include: signs and symptoms of postoperative infection, DVT/PE.  Patient has appointment scheduled at [facility] for preop testing. She also has an appointment scheduled with physical therapy brace fitting.  All patientts questions were invited and answered.  Follow-up: 2 weeks postop

## 2020-04-02 NOTE — H&P (Deleted)
  The note originally documented on this encounter has been moved the the encounter in which it belongs.  

## 2020-04-04 ENCOUNTER — Ambulatory Visit: Payer: Medicare Other | Admitting: Internal Medicine

## 2020-04-04 ENCOUNTER — Ambulatory Visit: Payer: Medicare Other

## 2020-04-08 NOTE — Progress Notes (Signed)
[id number] - [place], [place] - [address] [place] [address] Phone: [phone] Fax: [phone]      Your procedure is scheduled on Thursday 04/11/2020.  Report to [facility] Main Entrance "A" at 05:30 A.M., and check in at the Admitting office.  Call this number if you have problems the morning of surgery:  [phone]  Call [phone] if you have any questions prior to your surgery date Monday-Friday 8am-4pm    Remember:  Do not eat or drink after midnight the night before your surgery   Take these medicines the morning of surgery with A SIP OF WATER: Atorvastatin (Lipitor) Paroxetine (Paxil)  If needed you may take the following medications the morning of surgery: Acetaminophen (Tylenol)  As of today, STOP taking any Aspirin (unless otherwise instructed by your surgeon) Aleve, Naproxen, Ibuprofen, Motrin, Advil, Goody's, BC's, all herbal medications, fish oil, and all vitamins.                      Do not wear jewelry, make up, or nail polish            Do not wear lotions, powders, perfumes, or deodorant.            Do not shave 48 hours prior to surgery.            Do not bring valuables to the hospital.            [facility] is not responsible for any belongings or valuables.  Do NOT Smoke (Tobacco/Vaping) or drink Alcohol 24 hours prior to your procedure  If you use a CPAP at night, you may bring all equipment for your overnight stay.   Contacts, glasses, dentures or bridgework may not be worn into surgery.      For patients admitted to the hospital, discharge time will be determined by your treatment team.   Patients discharged the day of surgery will not be allowed to drive home, and someone needs to stay with them for 24 hours.    Special instructions:   [place]- Preparing For Surgery  Before surgery, you can play an important role. Because skin is not sterile, your skin  needs to be as free of germs as possible. You can reduce the number of germs on your skin by washing with CHG (chlorahexidine gluconate) Soap before surgery.  CHG is an antiseptic cleaner which kills germs and bonds with the skin to continue killing germs even after washing.    Oral Hygiene is also important to reduce your risk of infection.  Remember - BRUSH YOUR TEETH THE MORNING OF SURGERY WITH YOUR REGULAR TOOTHPASTE  Please do not use if you have an allergy to CHG or antibacterial soaps. If your skin becomes reddened/irritated stop using the CHG.  Do not shave (including legs and underarms) for at least 48 hours prior to first CHG shower. It is OK to shave your face.  Please follow these instructions carefully.   1. Shower the NIGHT BEFORE SURGERY and the MORNING OF SURGERY with CHG Soap.   2. If you chose to wash your hair, wash your hair first as usual with your normal shampoo.  3. After you shampoo, rinse your hair and body thoroughly to remove the shampoo.  4. Use CHG as you would any other liquid soap. You can apply CHG directly to the skin and wash gently with a scrungie  or a clean washcloth.   5. Apply the CHG Soap to your body ONLY FROM THE NECK DOWN.  Do not use on open wounds or open sores. Avoid contact with your eyes, ears, mouth and genitals (private parts). Wash Face and genitals (private parts)  with your normal soap.   6. Wash thoroughly, paying special attention to the area where your surgery will be performed.  7. Thoroughly rinse your body with warm water from the neck down.  8. DO NOT shower/wash with your normal soap after using and rinsing off the CHG Soap.  9. Pat yourself dry with a CLEAN TOWEL.  10. Wear CLEAN PAJAMAS to bed the night before surgery  11. Place CLEAN SHEETS on your bed the night of your first shower and DO NOT SLEEP WITH PETS.   Day of Surgery: Shower with CHG soap as directed Wear Clean/Comfortable clothing the morning of surgery Do  not apply any deodorants/lotions.   Remember to brush your teeth WITH YOUR REGULAR TOOTHPASTE.   Please read over the following fact sheets that you were given.

## 2020-04-09 ENCOUNTER — Ambulatory Visit (HOSPITAL_COMMUNITY)
Admission: RE | Admit: 2020-04-09 | Discharge: 2020-04-09 | Disposition: A | Discharge status: Home / Self Care | Payer: Medicare Other | Source: Ambulatory Visit | Attending: Orthopedic Surgery | Admitting: Orthopedic Surgery

## 2020-04-09 ENCOUNTER — Other Ambulatory Visit: Payer: Self-pay

## 2020-04-09 ENCOUNTER — Encounter (HOSPITAL_COMMUNITY): Payer: Self-pay

## 2020-04-09 ENCOUNTER — Encounter (HOSPITAL_COMMUNITY)
Admission: RE | Admit: 2020-04-09 | Discharge: 2020-04-09 | Disposition: A | Discharge status: Home / Self Care | Payer: Medicare Other | Source: Ambulatory Visit | Attending: Orthopedic Surgery | Admitting: Orthopedic Surgery

## 2020-04-09 ENCOUNTER — Other Ambulatory Visit (HOSPITAL_COMMUNITY)
Admission: RE | Admit: 2020-04-09 | Discharge: 2020-04-09 | Disposition: A | Discharge status: Home / Self Care | Payer: Medicare Other | Source: Ambulatory Visit | Attending: Orthopedic Surgery | Admitting: Orthopedic Surgery

## 2020-04-09 DIAGNOSIS — Z01818 Encounter for other preprocedural examination: Secondary | ICD-10-CM | POA: Diagnosis not present

## 2020-04-09 DIAGNOSIS — R918 Other nonspecific abnormal finding of lung field: Secondary | ICD-10-CM | POA: Diagnosis not present

## 2020-04-09 DIAGNOSIS — G4733 Obstructive sleep apnea (adult) (pediatric): Secondary | ICD-10-CM | POA: Insufficient documentation

## 2020-04-09 DIAGNOSIS — Z20822 Contact with and (suspected) exposure to covid-19: Secondary | ICD-10-CM | POA: Insufficient documentation

## 2020-04-09 HISTORY — DX: Other specified postprocedural states: Z98.890

## 2020-04-09 HISTORY — DX: Other specified postprocedural states: R11.2

## 2020-04-09 LAB — TYPE AND SCREEN
ABO/RH(D): O POS
Antibody Screen: NEGATIVE

## 2020-04-09 LAB — CBC
HCT: 40.5 % (ref 36.0–46.0)
Hemoglobin: 13 g/dL (ref 12.0–15.0)
MCH: 29.7 pg (ref 26.0–34.0)
MCHC: 32.1 g/dL (ref 30.0–36.0)
MCV: 92.5 fL (ref 80.0–100.0)
Platelets: 224 10*3/uL (ref 150–400)
RBC: 4.38 MIL/uL (ref 3.87–5.11)
RDW: 13.2 % (ref 11.5–15.5)
WBC: 5.8 10*3/uL (ref 4.0–10.5)
nRBC: 0 % (ref 0.0–0.2)

## 2020-04-09 LAB — URINALYSIS, ROUTINE W REFLEX MICROSCOPIC
Bilirubin Urine: NEGATIVE
Glucose, UA: NEGATIVE mg/dL
Hgb urine dipstick: NEGATIVE
Ketones, ur: NEGATIVE mg/dL
Leukocytes,Ua: NEGATIVE
Nitrite: NEGATIVE
Protein, ur: NEGATIVE mg/dL
Specific Gravity, Urine: 1.018 (ref 1.005–1.030)
pH: 5 (ref 5.0–8.0)

## 2020-04-09 LAB — BASIC METABOLIC PANEL
Anion gap: 7 (ref 5–15)
BUN: 24 mg/dL — ABNORMAL HIGH (ref 8–23)
CO2: 23 mmol/L (ref 22–32)
Calcium: 9.3 mg/dL (ref 8.9–10.3)
Chloride: 110 mmol/L (ref 98–111)
Creatinine, Ser: 1.09 mg/dL — ABNORMAL HIGH (ref 0.44–1.00)
GFR, Estimated: 53 mL/min — ABNORMAL LOW (ref >60)
Glucose, Bld: 92 mg/dL (ref 70–99)
Potassium: 4.4 mmol/L (ref 3.5–5.1)
Sodium: 140 mmol/L (ref 135–145)

## 2020-04-09 LAB — SARS CORONAVIRUS 2 (TAT 6-24 HRS): SARS Coronavirus 2: NEGATIVE

## 2020-04-09 LAB — SURGICAL PCR SCREEN
MRSA, PCR: NEGATIVE
Staphylococcus aureus: NEGATIVE

## 2020-04-09 LAB — APTT: aPTT: 31 seconds (ref 24–36)

## 2020-04-09 LAB — PROTIME-INR
INR: 1 (ref 0.8–1.2)
Prothrombin Time: 12.6 seconds (ref 11.4–15.2)

## 2020-04-09 NOTE — Progress Notes (Signed)
PCP - Glendale Chard Cardiologist - received clearance, MD in chart  PPM/ICD - denies   Chest x-ray - 04/09/2020 EKG - 03/08/2020 Stress Test - 03/14/20 ECHO - 03/17/20 Cardiac Cath - denies  Sleep Study - yes 2005? CPAP - yes, settings of 8. Pt informed to bring cpap and supplies DOS  Patient instructed to hold all Aspirin, NSAID's, herbal medications, fish oil and vitamins 7 days prior to surgery.   ERAS Protcol -no   COVID TEST- 04/09/20 1250   Anesthesia review: yes, per Dr. Rolena Infante  Patient denies shortness of breath, fever, cough and chest pain at PAT appointment   All instructions explained to the patient, with a verbal understanding of the material. Patient agrees to go over the instructions while at home for a better understanding. Patient also instructed to self quarantine after being tested for COVID-19. The opportunity to ask questions was provided.

## 2020-04-10 NOTE — Progress Notes (Signed)
Anesthesia Chart Review:  Patient was recently seen by cardiology for evaluation of abnormal EKG and preoperative stratification.  Per note by Dr. Virgina Jock 03/08/2020, "Pre-op risk stratification: Poor baseline functional capacity owing to back pain. Nonspecific abnormalities on EKG. Will obtain echocardiogram and lexiscan nuclear stress test. If no significant abnormalities, may proceed with surgery with low cardiac risk."  Nuclear stress 03/13/2020 was nonischemic, low risk.  Echo 03/15/2020 showed EF 55-60%, mild-mod MR, mild TR, moderate PR.  Dr. Virgina Jock commented on result stating, "Normal heart function. Mild to moderate valve leakiness, should not hinder with surgery plans. No follow up necessary for the valve leakiness, unless new symptoms of shortness of breath or leg edema develop in the future."  OSA on CPAP.  Preop labs reviewed, unremarkable.  EKG 03/08/2020: Sinus rhythm 76 bpm. Low voltage. Poor R wave progression. Cannot exclude old inferior infarct  CHEST - 2 VIEW 04/09/2020:  COMPARISON:  02/29/2020  FINDINGS: Cardiomediastinal silhouette within normal limits, unchanged. No pneumothorax or pleural effusion. No confluent airspace disease. Stigmata of emphysema, with increased retrosternal airspace, flattened hemidiaphragms, increased AP diameter, and hyperinflation on the AP view.  Coarsened interstitial markings.  Surgical changes of the cervical region. Degenerative changes of the thoracic spine. No acute displaced fracture.  IMPRESSION: Chronic lung changes without evidence of acute cardiopulmonary disease   Echocardiogram 03/15/2020:  Normal LV systolic function with visual EF 55-60%. Left ventricle cavity  is normal in size. Mild left ventricular hypertrophy. Normal global wall  motion. Normal diastolic filling pattern, normal LAP.  Mild to moderate mitral regurgitation.  Mild tricuspid regurgitation. No evidence of pulmonary hypertension.   Moderate pulmonic regurgitation.  No prior study for comparison.   Lexiscan Tetrofosmin stress test 03/13/2020: Lexiscan nuclear stress test performed using 1-day protocol. Normal myocardial perfusion. Stress LVEF 73%. Low risk study.    Wynonia Musty [facility]/Anesthesiology Phone [phone] 04/10/2020 8:35 AM

## 2020-04-10 NOTE — Anesthesia Preprocedure Evaluation (Addendum)
Anesthesia Evaluation  Patient identified by MRN, date of birth, ID band Patient awake    Reviewed: Allergy & Precautions, NPO status , Patient's Chart, lab work & pertinent test results  History of Anesthesia Complications (+) PONV and history of anesthetic complications  Airway Mallampati: I  TM Distance: >3 FB Neck ROM: Full    Dental no notable dental hx. (+) Teeth Intact, Dental Advisory Given, Implants, Caps   Pulmonary neg pulmonary ROS, sleep apnea , former smoker,    Pulmonary exam normal breath sounds clear to auscultation       Cardiovascular hypertension, Pt. on medications negative cardio ROS Normal cardiovascular exam Rhythm:Regular Rate:Normal     Neuro/Psych PSYCHIATRIC DISORDERS Anxiety Depression  Neuromuscular disease negative neurological ROS  negative psych ROS   GI/Hepatic negative GI ROS, Neg liver ROS,   Endo/Other  negative endocrine ROS  Renal/GU CRFRenal disease  negative genitourinary   Musculoskeletal negative musculoskeletal ROS (+) Arthritis , Osteoarthritis,    Abdominal   Peds negative pediatric ROS (+)  Hematology negative hematology ROS (+)   Anesthesia Other Findings   Reproductive/Obstetrics negative OB ROS                           Anesthesia Physical Anesthesia Plan  ASA: III  Anesthesia Plan: General   Post-op Pain Management:    Induction: Intravenous  PONV Risk Score and Plan: 3 and Ondansetron, Dexamethasone and Treatment may vary due to age or medical condition  Airway Management Planned: Oral ETT  Additional Equipment:   Intra-op Plan:   Post-operative Plan: Extubation in OR  Informed Consent:   Plan Discussed with: Anesthesiologist and CRNA  Anesthesia Plan Comments: (PAT note by Karoline Caldwell, PA-C: Patient was recently seen by cardiology for evaluation of abnormal EKG and preoperative stratification.  Per note by Dr.  Virgina Jock 03/08/2020, "Pre-op risk stratification: Poor baseline functional capacity owing to back pain. Nonspecific abnormalities on EKG. Will obtain echocardiogram and lexiscan nuclear stress test. If no significant abnormalities, may proceed with surgery with low cardiac risk."  Nuclear stress 03/13/2020 was nonischemic, low risk.  Echo 03/15/2020 showed EF 55-60%, mild-mod MR, mild TR, moderate PR.  Dr. Virgina Jock commented on result stating, "Normal heart function. Mild to moderate valve leakiness, should not hinder with surgery plans. No follow up necessary for the valve leakiness, unless new symptoms of shortness of breath or leg edema develop in the future."  OSA on CPAP.  Preop labs reviewed, unremarkable.  EKG 03/08/2020: Sinus rhythm 76 bpm. Low voltage. Poor R wave progression. Cannot exclude old inferior infarct  CHEST - 2 VIEW 04/09/2020:  COMPARISON:  02/29/2020  FINDINGS: Cardiomediastinal silhouette within normal limits, unchanged. No pneumothorax or pleural effusion. No confluent airspace disease. Stigmata of emphysema, with increased retrosternal airspace, flattened hemidiaphragms, increased AP diameter, and hyperinflation on the AP view.  Coarsened interstitial markings.  Surgical changes of the cervical region. Degenerative changes of the thoracic spine. No acute displaced fracture.  IMPRESSION: Chronic lung changes without evidence of acute cardiopulmonary disease   Echocardiogram 03/15/2020:  Normal LV systolic function with visual EF 55-60%. Left ventricle cavity  is normal in size. Mild left ventricular hypertrophy. Normal global wall  motion. Normal diastolic filling pattern, normal LAP.  Mild to moderate mitral regurgitation.  Mild tricuspid regurgitation. No evidence of pulmonary hypertension.  Moderate pulmonic regurgitation.  No prior study for comparison.   Lexiscan Tetrofosmin stress test 03/13/2020: Lexiscan nuclear stress test performed  using  1-day protocol. Normal myocardial perfusion. Stress LVEF 73%. Low risk study.   )       Anesthesia Quick Evaluation

## 2020-04-11 ENCOUNTER — Inpatient Hospital Stay (HOSPITAL_COMMUNITY): Payer: Medicare Other | Admitting: Certified Registered Nurse Anesthetist

## 2020-04-11 ENCOUNTER — Encounter (HOSPITAL_COMMUNITY): Payer: Self-pay | Admitting: Orthopedic Surgery

## 2020-04-11 ENCOUNTER — Encounter (HOSPITAL_COMMUNITY)
Admission: RE | Disposition: A | Discharge status: Home / Self Care | Payer: Self-pay | Source: Home / Self Care | Attending: Orthopedic Surgery

## 2020-04-11 ENCOUNTER — Observation Stay (HOSPITAL_COMMUNITY)
Admission: RE | Admit: 2020-04-11 | Discharge: 2020-04-12 | Disposition: A | Discharge status: Home / Self Care | Payer: Medicare Other | Attending: Orthopedic Surgery | Admitting: Orthopedic Surgery

## 2020-04-11 ENCOUNTER — Other Ambulatory Visit: Payer: Self-pay

## 2020-04-11 ENCOUNTER — Inpatient Hospital Stay (HOSPITAL_COMMUNITY): Payer: Medicare Other

## 2020-04-11 ENCOUNTER — Inpatient Hospital Stay (HOSPITAL_COMMUNITY): Payer: Medicare Other | Admitting: Physician Assistant

## 2020-04-11 DIAGNOSIS — I129 Hypertensive chronic kidney disease with stage 1 through stage 4 chronic kidney disease, or unspecified chronic kidney disease: Secondary | ICD-10-CM | POA: Diagnosis not present

## 2020-04-11 DIAGNOSIS — Z419 Encounter for procedure for purposes other than remedying health state, unspecified: Secondary | ICD-10-CM

## 2020-04-11 DIAGNOSIS — Z981 Arthrodesis status: Secondary | ICD-10-CM

## 2020-04-11 DIAGNOSIS — M5136 Other intervertebral disc degeneration, lumbar region: Principal | ICD-10-CM | POA: Insufficient documentation

## 2020-04-11 DIAGNOSIS — Z79899 Other long term (current) drug therapy: Secondary | ICD-10-CM | POA: Insufficient documentation

## 2020-04-11 DIAGNOSIS — M545 Low back pain, unspecified: Secondary | ICD-10-CM | POA: Diagnosis present

## 2020-04-11 DIAGNOSIS — N183 Chronic kidney disease, stage 3 unspecified: Secondary | ICD-10-CM | POA: Diagnosis not present

## 2020-04-11 DIAGNOSIS — Z87891 Personal history of nicotine dependence: Secondary | ICD-10-CM | POA: Diagnosis not present

## 2020-04-11 HISTORY — PX: TRANSFORAMINAL LUMBAR INTERBODY FUSION (TLIF) WITH PEDICLE SCREW FIXATION 1 LEVEL: SHX6141

## 2020-04-11 SURGERY — TRANSFORAMINAL LUMBAR INTERBODY FUSION (TLIF) WITH PEDICLE SCREW FIXATION 1 LEVEL
Anesthesia: General | Site: Spine Lumbar

## 2020-04-11 MED ORDER — LIDOCAINE 2% (20 MG/ML) 5 ML SYRINGE
INTRAMUSCULAR | Status: AC
  Filled 2020-04-11: qty 5

## 2020-04-11 MED ORDER — FENTANYL CITRATE (PF) 250 MCG/5ML IJ SOLN
INTRAMUSCULAR | Status: AC
  Filled 2020-04-11: qty 5

## 2020-04-11 MED ORDER — EPHEDRINE SULFATE-NACL 50-0.9 MG/10ML-% IV SOSY
PREFILLED_SYRINGE | INTRAVENOUS | Status: DC | PRN
  Administered 2020-04-11: 5 mg via INTRAVENOUS
  Administered 2020-04-11: 10 mg via INTRAVENOUS

## 2020-04-11 MED ORDER — TRANEXAMIC ACID-NACL 1000-0.7 MG/100ML-% IV SOLN
INTRAVENOUS | Status: AC
  Filled 2020-04-11: qty 100

## 2020-04-11 MED ORDER — BUPIVACAINE-EPINEPHRINE 0.25% -1:200000 IJ SOLN
INTRAMUSCULAR | Status: DC | PRN
  Administered 2020-04-11: 10 mL
  Administered 2020-04-11: 20 mL

## 2020-04-11 MED ORDER — SUCCINYLCHOLINE CHLORIDE 200 MG/10ML IV SOSY
PREFILLED_SYRINGE | INTRAVENOUS | Status: DC | PRN
  Administered 2020-04-11: 100 mg via INTRAVENOUS

## 2020-04-11 MED ORDER — VANCOMYCIN HCL IN DEXTROSE 1-5 GM/200ML-% IV SOLN: 1000.0000 mg | INTRAVENOUS | Status: DC

## 2020-04-11 MED ORDER — PAROXETINE HCL 30 MG PO TABS
30.0000 mg | ORAL_TABLET | Freq: Every day | ORAL | Status: DC
  Administered 2020-04-11: 30 mg via ORAL
  Filled 2020-04-11 (×2): qty 1

## 2020-04-11 MED ORDER — PROPOFOL 10 MG/ML IV BOLUS
INTRAVENOUS | Status: AC
  Filled 2020-04-11: qty 20

## 2020-04-11 MED ORDER — OXYCODONE HCL 5 MG/5ML PO SOLN: 5.0000 mg | Freq: Once | ORAL | Status: DC | PRN

## 2020-04-11 MED ORDER — ROCURONIUM BROMIDE 10 MG/ML (PF) SYRINGE
PREFILLED_SYRINGE | INTRAVENOUS | Status: AC
  Filled 2020-04-11: qty 10

## 2020-04-11 MED ORDER — OXYCODONE HCL 5 MG PO TABS: 10.0000 mg | ORAL_TABLET | ORAL | Status: DC | PRN

## 2020-04-11 MED ORDER — FENTANYL CITRATE (PF) 100 MCG/2ML IJ SOLN
INTRAMUSCULAR | Status: DC | PRN
  Administered 2020-04-11 (×5): 50 ug via INTRAVENOUS

## 2020-04-11 MED ORDER — SODIUM CHLORIDE 0.9% FLUSH: 3.0000 mL | INTRAVENOUS | Status: DC | PRN

## 2020-04-11 MED ORDER — 0.9 % SODIUM CHLORIDE (POUR BTL) OPTIME
TOPICAL | Status: DC | PRN
  Administered 2020-04-11 (×2): 1000 mL

## 2020-04-11 MED ORDER — MAGNESIUM HYDROXIDE 400 MG/5ML PO SUSP: 30.0000 mL | Freq: Every day | ORAL | Status: DC | PRN

## 2020-04-11 MED ORDER — BUPIVACAINE-EPINEPHRINE (PF) 0.25% -1:200000 IJ SOLN
INTRAMUSCULAR | Status: AC
  Filled 2020-04-11: qty 30

## 2020-04-11 MED ORDER — ONDANSETRON HCL 4 MG PO TABS: 4.0000 mg | ORAL_TABLET | Freq: Four times a day (QID) | ORAL | Status: DC | PRN

## 2020-04-11 MED ORDER — OXYCODONE HCL 5 MG PO TABS
5.0000 mg | ORAL_TABLET | ORAL | Status: DC | PRN
  Administered 2020-04-11 – 2020-04-12 (×3): 5 mg via ORAL
  Filled 2020-04-11 (×3): qty 1

## 2020-04-11 MED ORDER — METHOCARBAMOL 500 MG PO TABS: 500.0000 mg | ORAL_TABLET | Freq: Three times a day (TID) | ORAL | 0 refills | Status: AC | PRN

## 2020-04-11 MED ORDER — ONDANSETRON HCL 4 MG/2ML IJ SOLN
INTRAMUSCULAR | Status: DC | PRN
  Administered 2020-04-11 (×2): 4 mg via INTRAVENOUS

## 2020-04-11 MED ORDER — GABAPENTIN 300 MG PO CAPS
300.0000 mg | ORAL_CAPSULE | Freq: Three times a day (TID) | ORAL | Status: DC
  Administered 2020-04-11 – 2020-04-12 (×3): 300 mg via ORAL
  Filled 2020-04-11 (×3): qty 1

## 2020-04-11 MED ORDER — FENTANYL CITRATE (PF) 100 MCG/2ML IJ SOLN
INTRAMUSCULAR | Status: AC
  Filled 2020-04-11: qty 2

## 2020-04-11 MED ORDER — DEXAMETHASONE SODIUM PHOSPHATE 10 MG/ML IJ SOLN
INTRAMUSCULAR | Status: AC
  Filled 2020-04-11: qty 1

## 2020-04-11 MED ORDER — MEPERIDINE HCL 25 MG/ML IJ SOLN: 6.2500 mg | INTRAMUSCULAR | Status: DC | PRN

## 2020-04-11 MED ORDER — THROMBIN 20000 UNITS EX SOLR
CUTANEOUS | Status: AC
  Filled 2020-04-11: qty 20000

## 2020-04-11 MED ORDER — PROPOFOL 10 MG/ML IV BOLUS
INTRAVENOUS | Status: DC | PRN
  Administered 2020-04-11: 150 mg via INTRAVENOUS

## 2020-04-11 MED ORDER — METHOCARBAMOL 1000 MG/10ML IJ SOLN
500.0000 mg | Freq: Four times a day (QID) | INTRAVENOUS | Status: DC | PRN
  Filled 2020-04-11: qty 5

## 2020-04-11 MED ORDER — LIDOCAINE 2% (20 MG/ML) 5 ML SYRINGE
INTRAMUSCULAR | Status: DC | PRN
  Administered 2020-04-11: 80 mg via INTRAVENOUS

## 2020-04-11 MED ORDER — ACETAMINOPHEN 325 MG PO TABS: 650.0000 mg | ORAL_TABLET | ORAL | Status: DC | PRN

## 2020-04-11 MED ORDER — LACTATED RINGERS IV SOLN: INTRAVENOUS | Status: DC | PRN

## 2020-04-11 MED ORDER — FENTANYL CITRATE (PF) 100 MCG/2ML IJ SOLN
25.0000 ug | INTRAMUSCULAR | Status: DC | PRN
  Administered 2020-04-11 (×2): 25 ug via INTRAVENOUS

## 2020-04-11 MED ORDER — VANCOMYCIN HCL IN DEXTROSE 1-5 GM/200ML-% IV SOLN
1000.0000 mg | INTRAVENOUS | Status: AC
  Administered 2020-04-11 (×2): 1000 mg via INTRAVENOUS
  Filled 2020-04-11: qty 200

## 2020-04-11 MED ORDER — MIDAZOLAM HCL 2 MG/2ML IJ SOLN
INTRAMUSCULAR | Status: AC
  Filled 2020-04-11: qty 2

## 2020-04-11 MED ORDER — ONDANSETRON HCL 4 MG PO TABS: 4.0000 mg | ORAL_TABLET | Freq: Three times a day (TID) | ORAL | 0 refills | Status: DC | PRN

## 2020-04-11 MED ORDER — PHENYLEPHRINE HCL-NACL 10-0.9 MG/250ML-% IV SOLN
INTRAVENOUS | Status: DC | PRN
  Administered 2020-04-11: 20 ug/min via INTRAVENOUS

## 2020-04-11 MED ORDER — SODIUM CHLORIDE 0.9% FLUSH
3.0000 mL | Freq: Two times a day (BID) | INTRAVENOUS | Status: DC
  Administered 2020-04-11: 3 mL via INTRAVENOUS

## 2020-04-11 MED ORDER — ONDANSETRON HCL 4 MG/2ML IJ SOLN
INTRAMUSCULAR | Status: AC
  Filled 2020-04-11: qty 2

## 2020-04-11 MED ORDER — BUPIVACAINE LIPOSOME 1.3 % IJ SUSP
INTRAMUSCULAR | Status: DC | PRN
  Administered 2020-04-11: 20 mL

## 2020-04-11 MED ORDER — ONDANSETRON HCL 4 MG/2ML IJ SOLN: 4.0000 mg | Freq: Once | INTRAMUSCULAR | Status: DC | PRN

## 2020-04-11 MED ORDER — ACETAMINOPHEN 325 MG PO TABS: 325.0000 mg | ORAL_TABLET | ORAL | Status: DC | PRN

## 2020-04-11 MED ORDER — MIDAZOLAM HCL 2 MG/2ML IJ SOLN
INTRAMUSCULAR | Status: DC | PRN
  Administered 2020-04-11: 2 mg via INTRAVENOUS

## 2020-04-11 MED ORDER — PROPOFOL 500 MG/50ML IV EMUL
INTRAVENOUS | Status: DC | PRN
  Administered 2020-04-11: 50 ug/kg/min via INTRAVENOUS

## 2020-04-11 MED ORDER — FLEET ENEMA 7-19 GM/118ML RE ENEM: 1.0000 | ENEMA | Freq: Once | RECTAL | Status: DC | PRN

## 2020-04-11 MED ORDER — METHOCARBAMOL 500 MG PO TABS
500.0000 mg | ORAL_TABLET | Freq: Four times a day (QID) | ORAL | Status: DC | PRN
  Administered 2020-04-11 – 2020-04-12 (×3): 500 mg via ORAL
  Filled 2020-04-11 (×3): qty 1

## 2020-04-11 MED ORDER — LACTATED RINGERS IV SOLN: INTRAVENOUS | Status: DC

## 2020-04-11 MED ORDER — ACETAMINOPHEN 650 MG RE SUPP: 650.0000 mg | RECTAL | Status: DC | PRN

## 2020-04-11 MED ORDER — CHLORHEXIDINE GLUCONATE 0.12 % MT SOLN
15.0000 mL | Freq: Once | OROMUCOSAL | Status: AC
  Administered 2020-04-11: 15 mL via OROMUCOSAL
  Filled 2020-04-11: qty 15

## 2020-04-11 MED ORDER — OXYCODONE-ACETAMINOPHEN 10-325 MG PO TABS: 1.0000 | ORAL_TABLET | Freq: Four times a day (QID) | ORAL | 0 refills | Status: AC | PRN

## 2020-04-11 MED ORDER — ONDANSETRON HCL 4 MG/2ML IJ SOLN: 4.0000 mg | Freq: Four times a day (QID) | INTRAMUSCULAR | Status: DC | PRN

## 2020-04-11 MED ORDER — HEMOSTATIC AGENTS (NO CHARGE) OPTIME
TOPICAL | Status: DC | PRN
  Administered 2020-04-11 (×2): 1 via TOPICAL

## 2020-04-11 MED ORDER — OXYCODONE HCL 5 MG PO TABS: 5.0000 mg | ORAL_TABLET | Freq: Once | ORAL | Status: DC | PRN

## 2020-04-11 MED ORDER — THROMBIN 20000 UNITS EX SOLR
CUTANEOUS | Status: DC | PRN
  Administered 2020-04-11: 20 mL via TOPICAL

## 2020-04-11 MED ORDER — TRANEXAMIC ACID-NACL 1000-0.7 MG/100ML-% IV SOLN
INTRAVENOUS | Status: DC | PRN
  Administered 2020-04-11: 1000 mg via INTRAVENOUS

## 2020-04-11 MED ORDER — BUPIVACAINE LIPOSOME 1.3 % IJ SUSP
20.0000 mL | Freq: Once | INTRAMUSCULAR | Status: DC
  Filled 2020-04-11: qty 20

## 2020-04-11 MED ORDER — ORAL CARE MOUTH RINSE: 15.0000 mL | Freq: Once | OROMUCOSAL | Status: AC

## 2020-04-11 MED ORDER — DEXAMETHASONE SODIUM PHOSPHATE 10 MG/ML IJ SOLN
INTRAMUSCULAR | Status: DC | PRN
  Administered 2020-04-11: 10 mg via INTRAVENOUS

## 2020-04-11 MED ORDER — ACETAMINOPHEN 160 MG/5ML PO SOLN: 325.0000 mg | ORAL | Status: DC | PRN

## 2020-04-11 SURGICAL SUPPLY — 75 items
BLADE CLIPPER SURG (BLADE) IMPLANT
BUR EGG ELITE 4.0 (BURR) IMPLANT
CABLE BIPOLOR RESECTION CORD (MISCELLANEOUS) ×2 IMPLANT
CANISTER SUCT 3000ML PPV (MISCELLANEOUS) ×2 IMPLANT
CLIP NEUROVISION LG (CLIP) ×2 IMPLANT
CLSR STERI-STRIP ANTIMIC 1/2X4 (GAUZE/BANDAGES/DRESSINGS) ×2 IMPLANT
COVER SURGICAL LIGHT HANDLE (MISCELLANEOUS) ×2 IMPLANT
COVER WAND RF STERILE (DRAPES) ×2 IMPLANT
DEVICE ENDSKLTN NANOLCK 8MM XL (Cage) ×1 IMPLANT
DRAIN CHANNEL 15F RND FF W/TCR (WOUND CARE) ×2 IMPLANT
DRAPE C-ARM 42X72 X-RAY (DRAPES) ×2 IMPLANT
DRAPE C-ARMOR (DRAPES) ×2 IMPLANT
DRAPE POUCH INSTRU U-SHP 10X18 (DRAPES) ×2 IMPLANT
DRAPE SURG 17X23 STRL (DRAPES) ×2 IMPLANT
DRAPE U-SHAPE 47X51 STRL (DRAPES) ×2 IMPLANT
DRSG OPSITE POSTOP 4X6 (GAUZE/BANDAGES/DRESSINGS) ×2 IMPLANT
DRSG OPSITE POSTOP 4X8 (GAUZE/BANDAGES/DRESSINGS) ×2 IMPLANT
DURAPREP 26ML APPLICATOR (WOUND CARE) ×2 IMPLANT
ELECT BLADE 4.0 EZ CLEAN MEGAD (MISCELLANEOUS) ×2
ELECT BLADE 6.5 EXT (BLADE) IMPLANT
ELECT PENCIL ROCKER SW 15FT (MISCELLANEOUS) ×2 IMPLANT
ELECT REM PT RETURN 9FT ADLT (ELECTROSURGICAL) ×2
ELECTRODE BLDE 4.0 EZ CLN MEGD (MISCELLANEOUS) ×1 IMPLANT
ELECTRODE REM PT RTRN 9FT ADLT (ELECTROSURGICAL) ×1 IMPLANT
ENDOSKELETON NANOLOCK 8MM XL (Cage) ×2 IMPLANT
EVACUATOR SILICONE 100CC (DRAIN) ×2 IMPLANT
GLOVE BIOGEL PI IND STRL 8.5 (GLOVE) ×1 IMPLANT
GLOVE BIOGEL PI INDICATOR 8.5 (GLOVE) ×1
GLOVE SS BIOGEL STRL SZ 8.5 (GLOVE) ×1 IMPLANT
GLOVE SUPERSENSE BIOGEL SZ 8.5 (GLOVE) ×1
GOWN STRL REUS W/ TWL LRG LVL3 (GOWN DISPOSABLE) ×1 IMPLANT
GOWN STRL REUS W/TWL 2XL LVL3 (GOWN DISPOSABLE) ×4 IMPLANT
GOWN STRL REUS W/TWL LRG LVL3 (GOWN DISPOSABLE) ×1
GUIDEWIRE NITINOL BEVEL TIP (WIRE) ×8 IMPLANT
KIT BASIN OR (CUSTOM PROCEDURE TRAY) ×2 IMPLANT
KIT POSITION SURG JACKSON T1 (MISCELLANEOUS) IMPLANT
KIT TURNOVER KIT B (KITS) ×2 IMPLANT
MILL MEDIUM DISP (BLADE) ×2 IMPLANT
MODULE EMG NEEDLE SSEP NVM5 (NEEDLE) ×2 IMPLANT
MODULE NVM5 NEXT GEN EMG (NEEDLE) ×2 IMPLANT
NEEDLE 22X1 1/2 (OR ONLY) (NEEDLE) ×2 IMPLANT
NEEDLE SPNL 18GX3.5 QUINCKE PK (NEEDLE) ×4 IMPLANT
NEEDLE SPNL 22GX3.5 QUINCKE BK (NEEDLE) ×2 IMPLANT
NS IRRIG 1000ML POUR BTL (IV SOLUTION) ×2 IMPLANT
PACK LAMINECTOMY ORTHO (CUSTOM PROCEDURE TRAY) ×2 IMPLANT
PACK UNIVERSAL I (CUSTOM PROCEDURE TRAY) ×2 IMPLANT
PAD ARMBOARD 7.5X6 YLW CONV (MISCELLANEOUS) ×4 IMPLANT
PATTIES SURGICAL .5 X.5 (GAUZE/BANDAGES/DRESSINGS) IMPLANT
PATTIES SURGICAL .5 X1 (DISPOSABLE) ×4 IMPLANT
POSITIONER HEAD PRONE TRACH (MISCELLANEOUS) ×2 IMPLANT
PROBE BALL TIP NVM5 SNG USE (BALLOONS) ×2 IMPLANT
PUTTY DBX 1CC (Putty) ×2 IMPLANT
PUTTY DBX 1CC DEPUY (Putty) ×1 IMPLANT
REDUCTION EXT RELINE MAS MOD (Neuro Prosthesis/Implant) ×4 IMPLANT
ROD RELINE MAS TI LORD 5.5X40 (Rod) ×4 IMPLANT
SCREW LOCK RELINE 5.5 TULIP (Screw) ×8 IMPLANT
SCREW RELINE MAS POLY 6.5X40MM (Screw) ×2 IMPLANT
SCREW RELINE RED 6.5X45MM POLY (Screw) ×2 IMPLANT
SCREW SHANK MAS MOD 6.5X40MM (Screw) ×2 IMPLANT
SCREW SHANK RELINE 6.5X45MM 2C (Screw) ×2 IMPLANT
SPONGE LAP 4X18 RFD (DISPOSABLE) ×4 IMPLANT
SPONGE SURGIFOAM ABS GEL 100 (HEMOSTASIS) ×2 IMPLANT
SURGIFLO W/THROMBIN 8M KIT (HEMOSTASIS) ×2 IMPLANT
SUT BONE WAX W31G (SUTURE) ×2 IMPLANT
SUT MNCRL AB 3-0 PS2 27 (SUTURE) ×4 IMPLANT
SUT VIC AB 1 CT1 18XCR BRD 8 (SUTURE) ×2 IMPLANT
SUT VIC AB 1 CT1 8-18 (SUTURE) ×2
SUT VIC AB 2-0 CT1 18 (SUTURE) ×4 IMPLANT
SYR BULB IRRIG 60ML STRL (SYRINGE) ×2 IMPLANT
SYR CONTROL 10ML LL (SYRINGE) ×2 IMPLANT
TOWEL GREEN STERILE (TOWEL DISPOSABLE) ×2 IMPLANT
TOWEL GREEN STERILE FF (TOWEL DISPOSABLE) ×2 IMPLANT
TRAY FOLEY MTR SLVR 16FR STAT (SET/KITS/TRAYS/PACK) ×2 IMPLANT
WATER STERILE IRR 1000ML POUR (IV SOLUTION) ×2 IMPLANT
YANKAUER SUCT BULB TIP NO VENT (SUCTIONS) ×2 IMPLANT

## 2020-04-11 NOTE — Progress Notes (Signed)
Pharmacy - > Vancomycin   Has drain in place To continue Vancomycin until drain removed  Plan: Vancomycin 1 gram iv Q 24 hours DC Vancomycin when drain stopped  Thank you Anette Guarneri, PharmD

## 2020-04-11 NOTE — Brief Op Note (Signed)
04/11/2020  11:53 AM  PATIENT:  Jenny [name]  74 y.o. female  PRE-OPERATIVE DIAGNOSIS:  Degenerative disc disease L5-S1, lumbar stenosis with left radicular leg pain L4-5  POST-OPERATIVE DIAGNOSIS:  Degenerative disc disease L5-S1, lumbar stenosis with left radicular leg pain L4-5  PROCEDURE:  Procedure(s) with comments: TRANSFORAMINAL LUMBAR INTERBODY FUSION (TLIF) L5-S1, L4-5 LEFT LAMINOTOMY/ DECOMPRESSION (N/A) - 5 hrs  SURGEON:  Surgeon(s) and Role:    Melina Schools, MD - Primary  PHYSICIAN ASSISTANT:   ASSISTANTS: Amanda Ward. PA   ANESTHESIA:   general  EBL:  200 mL   BLOOD ADMINISTERED:none  DRAINS: 1 drain in the back   LOCAL MEDICATIONS USED:  MARCAINE    and OTHER exparel  SPECIMEN:  No Specimen  DISPOSITION OF SPECIMEN:  N/A  COUNTS:  YES  TOURNIQUET:  * No tourniquets in log *  DICTATION: .Dragon Dictation  PLAN OF CARE: Admit to inpatient   PATIENT DISPOSITION:  PACU - hemodynamically stable.

## 2020-04-11 NOTE — Discharge Instructions (Signed)

## 2020-04-11 NOTE — Transfer of Care (Signed)
Immediate Anesthesia Transfer of Care Note  Patient: Jenny [name]  Procedure(s) Performed: TRANSFORAMINAL LUMBAR INTERBODY FUSION (TLIF) L5-S1, L4-5 LEFT LAMINOTOMY/ DECOMPRESSION (N/A Spine Lumbar)  Patient Location: PACU  Anesthesia Type:General  Level of Consciousness: drowsy and patient cooperative  Airway & Oxygen Therapy: Patient Spontanous Breathing and Patient connected to face mask oxygen  Post-op Assessment: Report given to RN and Post -op Vital signs reviewed and stable  Post vital signs: Reviewed and stable  Last Vitals:  Vitals Value Taken Time  BP 129/58 04/11/20 1209  Temp    Pulse 89 04/11/20 1211  Resp 18 04/11/20 1211  SpO2 97 % 04/11/20 1211  Vitals shown include unvalidated device data.  Last Pain:  Vitals:   04/11/20 0607  PainSc: 0-No pain         Complications: No complications documented.

## 2020-04-11 NOTE — Anesthesia Postprocedure Evaluation (Signed)
Anesthesia Post Note  Patient: Jenny [name]  Procedure(s) Performed: TRANSFORAMINAL LUMBAR INTERBODY FUSION (TLIF) L5-S1, L4-5 LEFT LAMINOTOMY/ DECOMPRESSION (N/A Spine Lumbar)     Patient location during evaluation: PACU Anesthesia Type: General Level of consciousness: awake and alert Pain management: pain level controlled Vital Signs Assessment: post-procedure vital signs reviewed and stable Respiratory status: spontaneous breathing, nonlabored ventilation, respiratory function stable and patient connected to nasal cannula oxygen Cardiovascular status: blood pressure returned to baseline and stable Postop Assessment: no apparent nausea or vomiting Anesthetic complications: no   No complications documented.  Last Vitals:  Vitals:   04/11/20 1339 04/11/20 1411  BP: 127/64 (!) 141/70  Pulse: 86 86  Resp: 10 18  Temp: (!) 36.4 C 36.7 C  SpO2: 95% 94%    Last Pain:  Vitals:   04/11/20 1411  TempSrc: Oral  PainSc:                  Jenny [name]

## 2020-04-11 NOTE — H&P (Signed)
Addendum H&P: Patient presents today with ongoing back buttock and radicular left leg pain.  Attempts at conservative management had failed to provide her with improved quality of life.  Imaging studies demonstrate significant lumbar stenosis at L5-S1 with advanced degenerative lumbar disc disease.  In addition she has right lateral recess and foraminal stenosis at L4-5.  Surgical plan: Left L4-5 decompression and foraminotomy, left L5-S1 TLIF.  I have gone over the risks, benefits, and alternatives to surgery with her and she is expressed a understanding and willingness to move forward with surgery.  There has been no change in her clinical exam since her last office visit of 04/02/2020.

## 2020-04-11 NOTE — Op Note (Signed)
Operative report  Preoperative diagnosis: Degenerative lumbar disc disease L5-S1.  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  Transforaminal lumbar interbody fusion [id number]  Complications: None  EBL: 200 cc  First Assistant: Cleta Alberts, PA  Implants: NuVasive MIS pedicle screws.  6.5 x 45 mm length screws at L5, 6.5 x 40 mm length screws at S1.  Titan intervertebral cage packed with autograft and DBX.  Size 8 extra long implant.  Graft: Autograft from decompression, and DBX.  Indications: This is a very pleasant 74 year old woman who presents with severe back buttock and neuropathic left leg pain.  Attempts at conservative management had failed to alleviate her pain.  Her quality of life is continued to deteriorate.  As result we elected to move forward with the a forementioned surgical procedure to help improve her quality of life.  All appropriate risks benefits and alternatives were discussed with the patient and consent was obtained.  Operative report: Patient was brought the operating room placed upon the operating room table.  After successful induction of general anesthesia and endotracheal intubation, teds, SCDs, and Foley were inserted.  Patient was turned prone onto the Wilson frame and all bony prominences were well-padded.  The neuro monitoring representative applied all appropriate needles and pads for intraoperative SSEP and evoked motor potential monitoring.  The back was then prepped and draped in a standard fashion.  Standard timeout was taken to confirm patient, procedure, and all other important data.  Using AP fluoroscopy identified the lateral border of the L5 and S1 pedicle and marked the incision site.  I infiltrated this area with quarter percent Marcaine with epinephrine.  This was done bilaterally.  I then made a small stab incision and advanced the  Jamshidi needle percutaneously to the lateral aspect of the right L5 pedicle I confirmed satisfactory position in both the AP and lateral planes with fluoroscopy and then advanced the Jamshidi needle into the L5 pedicle this was done using AP fluoroscopy guidance in neuro monitoring.  As I neared the medial wall of the L5 pedicle I switched my view to the lateral position.  In the lateral fluoroscopy view the tip of the Jamshidi needle was just beyond the posterior wall of the vertebral body confirming satisfactory trajectory and position.  I advanced the Jamshidi needle into the vertebral body and then placed the guidepin through the Jamshidi needle to cannulate the pedicle.  I repeated this exact same procedure at S1 and on the contralateral side.  At this point all 4 pedicles were cannulated.  I extended the incision on the left side slightly cephalad to accommodate the L4 laminotomy that was required.  I then measured and placed the 6.5 x 45 mm screw at L5, and the 6.5 x 40 mm length screw at S1.  Both the screws were connected to the retracting blades to allow me to visualize the posterior aspect of the spine.  Once the retracting system was set up I could now visualize the L5-S1 level.  I then directly stimulated the L5 and S1 pedicles.  And there was no adverse activity at greater than 40 mA.  Using the osteotome I remove the entire inferior L5 facet.  A generous left-sided L5 laminotomy was performed with a 3 mm Kerrison rongeur.  I then continued to resect the remaining portion of the pars.  I then used a Penfield 4 to dissect through the ligamentum flavum and  I used my Kerrison rongeur to resect the ligamentum flavum.  Ligamentum flavum itself was significantly thickened and palpable consistent with what was seen on the preoperative MRI.  At this point I can now visualize the posterior lateral aspect of the thecal sac.  I gently retracted the nerve root and the thecal sac until I could see the medial  wall of the S1 pedicle.  I placed a neuro patty to help maintain my retraction.  I then continued superiorly until I identified the L4 nerve root in the foramen and protected this with a patty.  At this point the remaining portion of the pars was resected and I used an osteotome to trim down the superior portion of the S1 facet.  Once this was done I now could visualize the posterior lateral aspect of the annulus of the disc of L5-S1.  I then continued my dissection inferiorly to completely decompress the S1 nerve root as it entered the S1 foramen.  I also was able to palpate the pedicle confirming there was no breach.  Superiorly I made sure the entire pars was removed and that the L5 nerve root was completely decompressed in the foramen.  At this point the lateral recess and foraminal decompression was complete.  An annulotomy was performed with a 15 blade scalpel and I used a combination of pituitary rongeurs and curettes to remove the disc material at L5-S1.  I then gently distracted the intervertebral space and continue working until I was at the contralateral side.  Once I had completed the discectomy I confirmed that bleeding subchondral bone.  I used the trial inserters and elected to use the size 8 extra-large implant.  This provided the best overall fit.  The implant was obtained and packed with autograft that was obtained from the decompression.  I then used DBX to simply seal over the gaps in the cage.  Allograft bone was placed along the anterior annulus and packed into place and then the cage itself was malleted into position.  The cage was ensured to kick across to the other side and came to rest in the horizontal position.  At this point the epidural veins were identified and coagulated in order to aid in hemostasis.  With the implant in place I confirmed that it was below the posterior aspect of the vertebral body and was not impinging or contacting the nerve root or thecal sac.  I confirmed in  the AP and lateral planes satisfactory overall positioning of the cage.  The cage itself across the midline and allowed for indirect foraminal decompression on the right side.  With the TLIF complete I repositioned my retractors to expose the L4 lamina.  The L4 lamina was identified and I used a 3 mm Kerrison rongeur to perform a laminotomy of L4 on the left side.  I then gently dissected through the ligamentum flavum until I could create a plane between the thecal sac and the ligamentum flavum.  I then remove the ligamentum flavum and continued my dissection into the lateral recess.  I performed a medial facetectomy in order to adequately decompress the L5 nerve root in the lateral recess.  At this point I noted that there was still compression of the thecal sac.  Appears as though the right sided stenosis was compressing the thecal sac.  In order to address this I dissected up and over the thecal sac and created a plane underneath the right sided facet.  A neuro patty was placed to  protect the thecal sac to prevent iatrogenic injury and then I resected the ligamentum flavum and a portion of the medial aspect of the facet to decompress this area once this was complete the thecal sac seem to be centralized and adequately decompressed.  At this point with the central decompression complete as well as the lateral recess and foraminal decompression complaint I irrigated the wound copiously with normal saline.  I confirmed using fluoroscopy that I have decompressed the left L4 level from the midportion of the L4 pedicle down and into the L5 foramen.  This spanned the area of maximum compression seen on the MRI at the L4-5 level.  At this point I used bipolar cautery and FloSeal to obtain hemostasis.  With the decompression complete I then took the kyphosis out of the Wilson frame and measured and placed a 40 mm length rod on the left side.  It was locked in place with the locking caps both of which were torqued  according manufacture standards.  The insertion tabs were removed.  I then went to the contralateral side and placed the pedicle screws over the guidepins.  I again stimulated both pedicle screws and there was no adverse activity at greater than 40 mA.  A rod was passed subcutaneously and then secured with the locking caps.  Both were torqued according manufacture standards.  The insertion tabs were broken off and removed.  Final intraoperative x-rays were taken which confirmed satisfactory position of the L5-S1 intervertebral cage as well as the instrumentation (pedicle screw rod construct) at L5-S1.  At this point both wounds were copiously irrigated with normal saline and a final check for hemostasis was made.  I did place a deep drain which was brought out of a separate stab incision onto the left side in case there was postoperative bleeding.  I closed the deep fascia and interrupted #1 Vicryl sutures superficial 2-0 Vicryl suture and then a running 3-0 Monocryl for the skin edges.  Steri-Strips and dry dressings were applied and the patient was ultimately extubated transferred to the PACU without incident.  The end of the case all needle sponge counts were correct.  There were no adverse intraoperative events.

## 2020-04-11 NOTE — Anesthesia Procedure Notes (Signed)
Procedure Name: Intubation Date/Time: 04/11/2020 7:36 AM Performed by: Genelle Bal, CRNA Pre-anesthesia Checklist: Patient identified, Emergency Drugs available, Suction available and Patient being monitored Patient Re-evaluated:Patient Re-evaluated prior to induction Oxygen Delivery Method: Circle system utilized Preoxygenation: Pre-oxygenation with 100% oxygen Induction Type: IV induction Ventilation: Mask ventilation without difficulty Laryngoscope Size: Miller and 2 Grade View: Grade I Tube type: Oral Tube size: 7.0 mm Number of attempts: 1 Airway Equipment and Method: Stylet and Oral airway Placement Confirmation: ETT inserted through vocal cords under direct vision,  positive ETCO2 and breath sounds checked- equal and bilateral Secured at: 21 cm Tube secured with: Tape Dental Injury: Teeth and Oropharynx as per pre-operative assessment

## 2020-04-12 ENCOUNTER — Encounter (HOSPITAL_COMMUNITY): Payer: Self-pay | Admitting: Orthopedic Surgery

## 2020-04-12 DIAGNOSIS — M5136 Other intervertebral disc degeneration, lumbar region: Secondary | ICD-10-CM | POA: Diagnosis not present

## 2020-04-12 LAB — ABO/RH: ABO/RH(D): O POS

## 2020-04-12 NOTE — Progress Notes (Signed)
Patient is discharged from room 3C07 at this time. Alert and in stable condition. IV site d/c'd as well as JP drain. Instructions read to patient and spouse with understanding verbalized and all questions answered. Left unit via wheelchair with all belongings at side.

## 2020-04-12 NOTE — Discharge Summary (Signed)
Patient ID: Jenny [name] MRN: [id number] DOB/AGE: 29-Aug-1945 74 y.o.  Admit date: 04/11/2020 Discharge date: 04/12/2020  Admission Diagnoses:  Active Problems:   S/P lumbar fusion   Discharge Diagnoses:  Active Problems:   S/P lumbar fusion  status post Procedure(s): TRANSFORAMINAL LUMBAR INTERBODY FUSION (TLIF) L5-S1, L4-5 LEFT LAMINOTOMY/ DECOMPRESSION  Past Medical History:  Diagnosis Date  . Anxiety   . Arthritis    generalized  . Bursitis of both hips   . 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 Cataract [id number]  . Chronic kidney disease   . Depression   . Hyperlipidemia   . Hypertension   . Neuromuscular disorder ([place])    Peripheral Nueropathy ( Left Side)  . Plantar fasciitis   . PONV (postoperative nausea and vomiting)    2014 (per pt, hard time waking up)  . Sleep apnea     Surgeries: Procedure(s): TRANSFORAMINAL LUMBAR INTERBODY FUSION (TLIF) L5-S1, L4-5 LEFT LAMINOTOMY/ DECOMPRESSION on 04/11/2020   Consultants:   Discharged Condition: Improved  Hospital Course: Jenny [name] is an 74 y.o. female who was admitted 04/11/2020 for operative treatment of Degenerative disc disease L5-S1, lumbar stenosis with left radicular leg pain L4-5. Patient failed conservative treatments (please see the history and physical for the specifics) and had severe unremitting pain that affects sleep, daily activities and work/hobbies. After pre-op clearance, the patient was taken to the operating room on 04/11/2020 and underwent  Procedure(s): TRANSFORAMINAL LUMBAR INTERBODY FUSION (TLIF) L5-S1, L4-5 LEFT LAMINOTOMY/ DECOMPRESSION.    Patient was given perioperative antibiotics:  Anti-infectives (From admission, onward)   Start     Dose/Rate Route Frequency Ordered Stop   04/12/20 0800  vancomycin (VANCOCIN) IVPB 1000 mg/200 mL premix        1,000 mg 200 mL/hr over 60 Minutes Intravenous Every 24 hours 04/11/20 1420     04/11/20 0611  vancomycin (VANCOCIN) IVPB  1000 mg/200 mL premix        1,000 mg 200 mL/hr over 60 Minutes Intravenous 60 min pre-op 04/11/20 9983 04/11/20 1431       Patient was given sequential compression devices and early ambulation to prevent DVT.   Patient benefited maximally from hospital stay and there were no complications. At the time of discharge, the patient was urinating/moving their bowels without difficulty, tolerating a regular diet, pain is controlled with oral pain medications and they have been cleared by PT/OT.   Recent vital signs:  Patient Vitals for the past 24 hrs:  BP Temp Temp src Pulse Resp SpO2  04/12/20 0732 (!) 112/58 98.1 F (36.7 C) Oral 78 16 97 %  04/12/20 0545 131/60 98.6 F (37 C) Oral 83 18 95 %  04/11/20 2328 129/70 98.3 F (36.8 C) Oral 79 18 97 %  04/11/20 2017 123/60 98.2 F (36.8 C) Oral 79 16 95 %  04/11/20 1411 (!) 141/70 98.1 F (36.7 C) Oral 86 18 94 %  04/11/20 1339 127/64 (!) 97.5 F (36.4 C) -- 86 10 95 %  04/11/20 1324 129/60 -- -- 86 13 97 %  04/11/20 1309 128/62 -- -- 85 14 96 %     Recent laboratory studies: No results for input(s): WBC, HGB, HCT, PLT, NA, K, CL, CO2, BUN, CREATININE, GLUCOSE, INR, CALCIUM in the last 72 hours.  Invalid input(s): PT, 2   Discharge Medications:   Allergies as of 04/12/2020      Reactions   Clindamycin Hives, Itching, Rash   Prochlorperazine Palpitations   Rapid  heart rate, insomnia, made her "super hyper"   Ibandronic Acid    Lower Jaw Pain   Raloxifene    Lower Jaw Pain   Diclofenac Sodium Other (See Comments)   Turns feces white   Penicillin V Potassium Rash      Medication List    STOP taking these medications   acetaminophen 650 MG CR tablet Commonly known as: TYLENOL   Beano Tabs   Calcium Carbonate-Vitamin D 600-400 MG-UNIT tablet   Delta D3 10 MCG (400 UNIT) Tabs tablet Generic drug: cholecalciferol   docusate sodium 100 MG capsule Commonly known as: COLACE   lactase 3000 units tablet Commonly known  as: LACTAID   Tylenol 325 MG Caps Generic drug: Acetaminophen     TAKE these medications   atorvastatin 10 MG tablet Commonly known as: Lipitor Take 1 tablet every day by oral route. What changed:   how much to take  how to take this  when to take this  additional instructions   Denta 5000 Plus 1.1 % Crea dental cream Generic drug: sodium fluoride Place 1 application onto teeth at bedtime.   diphenhydrAMINE 25 mg capsule Commonly known as: BENADRYL Take 25 mg by mouth at bedtime as needed for sleep.   methocarbamol 500 MG tablet Commonly known as: Robaxin Take 1 tablet (500 mg total) by mouth every 8 (eight) hours as needed for up to 5 days for muscle spasms.   ondansetron 4 MG tablet Commonly known as: Zofran Take 1 tablet (4 mg total) by mouth every 8 (eight) hours as needed for nausea or vomiting.   oxyCODONE-acetaminophen 10-325 MG tablet Commonly known as: Percocet Take 1 tablet by mouth every 6 (six) hours as needed for up to 5 days for pain.   PARoxetine 30 MG tablet Commonly known as: Paxil Take 1 tablet every day by oral route. What changed:   how much to take  how to take this  when to take this  additional instructions            Durable Medical Equipment  (From admission, onward)         Start     Ordered   04/12/20 1008  For home use only DME 3 n 1  Once        04/12/20 1007          Diagnostic Studies: DG Chest 2 View  Result Date: 04/09/2020 CLINICAL DATA:  74 year old female with a history of preoperative chest x-ray EXAM: CHEST - 2 VIEW COMPARISON:  02/29/2020 FINDINGS: Cardiomediastinal silhouette within normal limits, unchanged. No pneumothorax or pleural effusion. No confluent airspace disease. Stigmata of emphysema, with increased retrosternal airspace, flattened hemidiaphragms, increased AP diameter, and hyperinflation on the AP view. Coarsened interstitial markings. Surgical changes of the cervical region. Degenerative  changes of the thoracic spine. No acute displaced fracture. IMPRESSION: Chronic lung changes without evidence of acute cardiopulmonary disease Electronically Signed   By: Corrie Mckusick D.O.   On: 04/09/2020 16:44   DG Lumbar Spine Complete  Result Date: 04/11/2020 CLINICAL DATA:  Surgery, elective. Additional history provided: L5-S1 TLIF, L4-5 left laminotomy and decompression. Provided fluoroscopy time 3 minutes, 15 seconds (150.90 mGy). EXAM: LUMBAR SPINE - COMPLETE 4+ VIEW; DG C-ARM 1-60 MIN COMPARISON:  Lumbar spine MRI 01/11/2019. FINDINGS: PA and lateral view intraoperative fluoroscopic images of the lumbar spine are submitted, 4 images total. The images demonstrate a posterior spinal fusion construct at the L5-S1 level utilizing bilateral pedicle screws and vertical interconnecting  rods. An L5-S1 interbody spacer is also present. On the lateral view fluoroscopic images taken at 11:02 a.m., a surgical instrument projects in the region of the L4-L5 neural foramina and there are retractors posterior to the L4-L5 level. IMPRESSION: Four intraoperative fluoroscopic images of the lumbar spine, as detailed. Electronically Signed   By: Kellie Simmering DO   On: 04/11/2020 11:47   DG C-Arm 1-60 Min  Result Date: 04/11/2020 CLINICAL DATA:  Surgery, elective. Additional history provided: L5-S1 TLIF, L4-5 left laminotomy and decompression. Provided fluoroscopy time 3 minutes, 15 seconds (150.90 mGy). EXAM: LUMBAR SPINE - COMPLETE 4+ VIEW; DG C-ARM 1-60 MIN COMPARISON:  Lumbar spine MRI 01/11/2019. FINDINGS: PA and lateral view intraoperative fluoroscopic images of the lumbar spine are submitted, 4 images total. The images demonstrate a posterior spinal fusion construct at the L5-S1 level utilizing bilateral pedicle screws and vertical interconnecting rods. An L5-S1 interbody spacer is also present. On the lateral view fluoroscopic images taken at 11:02 a.m., a surgical instrument projects in the region of the L4-L5  neural foramina and there are retractors posterior to the L4-L5 level. IMPRESSION: Four intraoperative fluoroscopic images of the lumbar spine, as detailed. Electronically Signed   By: Kellie Simmering DO   On: 04/11/2020 11:47   PCV ECHOCARDIOGRAM COMPLETE  Result Date: 03/17/2020 Echocardiogram 03/15/2020: Normal LV systolic function with visual EF 55-60%. Left ventricle cavity is normal in size. Mild left ventricular hypertrophy. Normal global wall motion. Normal diastolic filling pattern, normal LAP. Mild to moderate mitral regurgitation. Mild tricuspid regurgitation. No evidence of pulmonary hypertension. Moderate pulmonic regurgitation. No prior study for comparison.    Discharge Instructions    Incentive spirometry RT   Complete by: As directed        Follow-up Information    Melina Schools, MD. Schedule an appointment as soon as possible for a visit in 2 weeks.   Specialty: Orthopedic Surgery Why: If symptoms worsen, For suture removal, For wound re-check Contact information: [address] [place] [address] [phone]               Discharge Plan:  discharge to home  Disposition: stable    Signed: Yvonne Kendall Melaysia Streed for [facility] PA-C [facility] [phone] 04/12/2020, 1:07 PM

## 2020-04-12 NOTE — Evaluation (Signed)
Occupational Therapy Evaluation/Discharge  Patient Details Name: Jenny [name] MRN: [id number] DOB: [birth year] Today's Date: 04/12/2020    History of Present Illness Jenny [name] is an 74 y.o. female who was admitted 04/11/2020 for operative treatment of Degenerative disc disease L5-S1, lumbar stenosis with left radicular leg pain L4-5.    Clinical Impression   PTA, pt lives with husband and reports able to complete ADLs/IADLs though difficult due to pain. Pt reports unable to stand longer than 5 minutes prior to surgery and used wheelchair outside of the home. Pt presents now with improved pain level. Pt initially unsteady with mobility but noted improvements with prolonged activity. Pt overall Supervision for mobility (slow, cautious pace), Independent for toileting tasks, UB ADLs and ADLs standing at sink. Pt with difficulty reaching B feet for socks/shoes - educated on AE use and husband assist as needed for these tasks. Pt able to recall spinal precautions and mindful of maintaining them. Educated and recommended use of BSC over low toilet at home and use in shower as needed. No further OT services needed at acute level or on discharge. OT to sign off.     Follow Up Recommendations  No OT follow up;Supervision - Intermittent    Equipment Recommendations  3 in 1 bedside commode    Recommendations for Other Services       Precautions / Restrictions Precautions Precautions: Fall;Back Precaution Booklet Issued: Yes (comment) Required Braces or Orthoses: Spinal Brace Spinal Brace: Thoracolumbosacral orthotic Restrictions Weight Bearing Restrictions: No      Mobility Bed Mobility               General bed mobility comments: received sitting EOB    Transfers Overall transfer level: Needs assistance Equipment used: None Transfers: Sit to/from Stand Sit to Stand: Supervision         General transfer comment: Supervision for safety due to intitial unsteadiness,  progressing to Independent during session for sit to stands    Balance Overall balance assessment: Needs assistance Sitting-balance support: No upper extremity supported;Feet supported Sitting balance-Leahy Scale: Normal     Standing balance support: No upper extremity supported;During functional activity Standing balance-Leahy Scale: Good Standing balance comment: initially reaching out for support but progressed to no UE support needed                            ADL either performed or assessed with clinical judgement   ADL Overall ADL's : Needs assistance/impaired Eating/Feeding: Independent;Sitting   Grooming: Independent;Standing;Wash/dry hands Grooming Details (indicate cue type and reason): Independent for washing hands at sink Upper Body Bathing: Independent;Sitting   Lower Body Bathing: Minimal assistance;Sit to/from stand;Sitting/lateral leans   Upper Body Dressing : Independent;Sitting Upper Body Dressing Details (indicate cue type and reason): Independent to don shirt, bra and LSO brace after initial cues for problem solving maintaining back precautions Lower Body Dressing: Minimal assistance;Sit to/from stand;Sitting/lateral leans Lower Body Dressing Details (indicate cue type and reason): Min A for socks/shoes due to difficulty with figure four position. Able to don underwear/pants without assistance Toilet Transfer: Supervision/safety;Ambulation;Regular Toilet;RW Toilet Transfer Details (indicate cue type and reason): Supervision for mobility to/from bathroom without AD due to initial unsteadiness and reaching out for support.  Toileting- Water quality scientist and Hygiene: Independent;Sit to/from stand Toileting - Clothing Manipulation Details (indicate cue type and reason): Independent for toileting hygiene after urination. Cues for problem solving in effective posterior hygiene while following back precautions     Functional  mobility during ADLs:  Supervision/safety General ADL Comments: Pt with mild unsteadiness without AD initially that improved with further activity. Pt with good adherence to back precautions     Vision Patient Visual Report: No change from baseline Vision Assessment?: No apparent visual deficits     Perception     Praxis      Pertinent Vitals/Pain Pain Assessment: Faces Faces Pain Scale: No hurt     Hand Dominance Right   Extremity/Trunk Assessment Upper Extremity Assessment Upper Extremity Assessment: Overall WFL for tasks assessed   Lower Extremity Assessment Lower Extremity Assessment: Defer to PT evaluation   Cervical / Trunk Assessment Cervical / Trunk Assessment: Normal   Communication Communication Communication: No difficulties   Cognition Arousal/Alertness: Awake/alert Behavior During Therapy: WFL for tasks assessed/performed Overall Cognitive Status: Within Functional Limits for tasks assessed                                     General Comments  Pt reports low toilet at home, educated on obtaining BSC to place over toilet and can use in shower as well. Pt reports she can purchase separate shower chair as needed but interested in [facility]    Exercises     Shoulder Instructions      [place] expects to be discharged to:: Private residence Living Arrangements: Spouse/significant other Available Help at Discharge: Family;Available 24 hours/day (son, daughter in law live nearby and can assist) Type of Home: House Home Access: Stairs to enter Technical brewer of Steps: 3 Entrance Stairs-Rails: Right;Left Home Layout: One level     Bathroom Shower/Tub: Occupational psychologist: Standard Bathroom Accessibility: Yes How Accessible: Accessible via walker Home Equipment: Wheelchair - manual;Shower seat - built in;Hand held shower head          Prior Functioning/Environment Level of Independence: Needs assistance  Gait / Transfers  Assistance Needed: Assisted with wc mobility outside of the home due to pain with prolonged standing. Held on to husband's arm for support when walking at times ADL's / Homemaking Assistance Needed: Able to complete ADLs, IADLs though difficult due to pain            OT Problem List:        OT Treatment/Interventions:      OT Goals(Current goals can be found in the care plan section) Acute Rehab OT Goals Patient Stated Goal: go home with husband today  OT Frequency:     Barriers to D/C:            Co-evaluation              AM-PAC OT "6 Clicks" Daily Activity     Outcome Measure Help from another person eating meals?: None Help from another person taking care of personal grooming?: None Help from another person toileting, which includes using toliet, bedpan, or urinal?: None Help from another person bathing (including washing, rinsing, drying)?: A Little Help from another person to put on and taking off regular upper body clothing?: None Help from another person to put on and taking off regular lower body clothing?: A Little 6 Click Score: 22   End of Session Equipment Utilized During Treatment: Back brace Nurse Communication: Mobility status  Activity Tolerance: Patient tolerated treatment well Patient left: Other (comment) (standing in room with case manager)  Time: 9957-9009 OT Time Calculation (min): 27 min Charges:  OT General Charges $OT Visit: 1 Visit OT Evaluation $OT Eval Low Complexity: 1 Low OT Treatments $Self Care/Home Management : 8-22 mins  Layla Maw, OTR/L  Layla Maw 04/12/2020, 8:53 AM

## 2020-04-12 NOTE — Progress Notes (Signed)
Subjective: 1 Day Post-Op Procedure(s) (LRB): TRANSFORAMINAL LUMBAR INTERBODY FUSION (TLIF) L5-S1, L4-5 LEFT LAMINOTOMY/ DECOMPRESSION (N/A) Patient reports pain as mild.   Radicular leg pain improved. Tolerating PO without Nausea or vomiting +void, +flatus +ambulation Denies CP, calf pain, SOB, fevers/sweats/chills  Objective: Vital signs in last 24 hours: Temp:  [97.5 F (36.4 C)-98.6 F (37 C)] 98.1 F (36.7 C) (11/19 0732) Pulse Rate:  [78-93] 78 (11/19 0732) Resp:  [10-18] 16 (11/19 0732) BP: (112-141)/(56-70) 112/58 (11/19 0732) SpO2:  [92 %-97 %] 97 % (11/19 0732)  Intake/Output from previous day: 11/18 0701 - 11/19 0700 In: 1940 [P.O.:240; I.V.:1700] Out: 1205 [Urine:950; Drains:55; Blood:200] Intake/Output this shift: No intake/output data recorded.  Recent Labs    04/09/20 1151  HGB 13.0   Recent Labs    04/09/20 1151  WBC 5.8  RBC 4.38  HCT 40.5  PLT 224   Recent Labs    04/09/20 1151  NA 140  K 4.4  CL 110  CO2 23  BUN 24*  CREATININE 1.09*  GLUCOSE 92  CALCIUM 9.3   Recent Labs    04/09/20 1151  INR 1.0    Neurologically intact ABD soft Neurovascular intact Sensation intact distally Intact pulses distally Dorsiflexion/Plantar flexion intact Incision: dressing C/D/I and no drainage No cellulitis present Compartment soft No calf tenderness 55cc total, 35cc over last 8 hours of Serosanguineous fluid in drain   Assessment/Plan: 1 Day Post-Op Procedure(s) (LRB): TRANSFORAMINAL LUMBAR INTERBODY FUSION (TLIF) L5-S1, L4-5 LEFT LAMINOTOMY/ DECOMPRESSION (N/A) Advance diet Up with therapy D/C drain LSO brace Encourage IS DVT ppx: Teds. SCDs, ambulation  Plan D/C today after PT/OT and drain removal.  F/u: 2 weeks with Dr. Budd Palmer  N Catera Hankins 04/12/2020, 7:34 AM

## 2020-04-12 NOTE — Care Management Obs Status (Signed)
[place] NOTIFICATION   Patient Details  Name: Jenny [name] MRN: [id number] Date of Birth: 06/15/1945   Medicare Observation Status Notification Given:  Yes    Angelita Ingles, RN 04/12/2020, 8:34 AM

## 2020-04-12 NOTE — Progress Notes (Signed)
Patient ID: Jenny [name] MRN: [id number] DOB/AGE: January 19, 1946 74 y.o.  Admit date: 04/11/2020 Discharge date: 04/12/2020  Admission Diagnoses:  Active Problems:   S/P lumbar fusion   Discharge Diagnoses:  Active Problems:   S/P lumbar fusion  status post Procedure(s): TRANSFORAMINAL LUMBAR INTERBODY FUSION (TLIF) L5-S1, L4-5 LEFT LAMINOTOMY/ DECOMPRESSION  Past Medical History:  Diagnosis Date  . Anxiety   . Arthritis    generalized  . Bursitis of both hips   . 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 Cataract [id number]  . Chronic kidney disease   . Depression   . Hyperlipidemia   . Hypertension   . Neuromuscular disorder ([place])    Peripheral Nueropathy ( Left Side)  . Plantar fasciitis   . PONV (postoperative nausea and vomiting)    2014 (per pt, hard time waking up)  . Sleep apnea     Surgeries: Procedure(s): TRANSFORAMINAL LUMBAR INTERBODY FUSION (TLIF) L5-S1, L4-5 LEFT LAMINOTOMY/ DECOMPRESSION on 04/11/2020   Consultants:   Discharged Condition: Improved  Hospital Course: Jenny [name] is an 74 y.o. female who was admitted 04/11/2020 for operative treatment of Degenerative disc disease L5-S1, lumbar stenosis with left radicular leg pain L4-5. Patient failed conservative treatments (please see the history and physical for the specifics) and had severe unremitting pain that affects sleep, daily activities and work/hobbies. After pre-op clearance, the patient was taken to the operating room on 04/11/2020 and underwent  Procedure(s): TRANSFORAMINAL LUMBAR INTERBODY FUSION (TLIF) L5-S1, L4-5 LEFT LAMINOTOMY/ DECOMPRESSION.    Patient was given perioperative antibiotics:  Anti-infectives (From admission, onward)   Start     Dose/Rate Route Frequency Ordered Stop   04/12/20 0800  vancomycin (VANCOCIN) IVPB 1000 mg/200 mL premix        1,000 mg 200 mL/hr over 60 Minutes Intravenous Every 24 hours 04/11/20 1420     04/11/20 0611  vancomycin (VANCOCIN) IVPB  1000 mg/200 mL premix        1,000 mg 200 mL/hr over 60 Minutes Intravenous 60 min pre-op 04/11/20 6384 04/11/20 1431       Patient was given sequential compression devices and early ambulation to prevent DVT.   Patient benefited maximally from hospital stay and there were no complications. At the time of discharge, the patient was urinating/moving their bowels without difficulty, tolerating a regular diet, pain is controlled with oral pain medications and they have been cleared by PT/OT.   Recent vital signs:  Patient Vitals for the past 24 hrs:  BP Temp Temp src Pulse Resp SpO2  04/12/20 0732 (!) 112/58 98.1 F (36.7 C) Oral 78 16 97 %  04/12/20 0545 131/60 98.6 F (37 C) Oral 83 18 95 %  04/11/20 2328 129/70 98.3 F (36.8 C) Oral 79 18 97 %  04/11/20 2017 123/60 98.2 F (36.8 C) Oral 79 16 95 %  04/11/20 1411 (!) 141/70 98.1 F (36.7 C) Oral 86 18 94 %  04/11/20 1339 127/64 (!) 97.5 F (36.4 C) -- 86 10 95 %  04/11/20 1324 129/60 -- -- 86 13 97 %  04/11/20 1309 128/62 -- -- 85 14 96 %  04/11/20 1254 (!) 129/56 -- -- 92 13 93 %  04/11/20 1239 (!) 123/57 -- -- 92 12 93 %  04/11/20 1224 (!) 117/58 -- -- 86 15 92 %  04/11/20 1215 (!) 126/58 -- -- 89 16 97 %  04/11/20 1209 (!) 129/58 98.5 F (36.9 C) -- 93 18 97 %     Recent  laboratory studies:  Recent Labs    04/09/20 1151  WBC 5.8  HGB 13.0  HCT 40.5  PLT 224  NA 140  K 4.4  CL 110  CO2 23  BUN 24*  CREATININE 1.09*  GLUCOSE 92  INR 1.0  CALCIUM 9.3     Discharge Medications:   Allergies as of 04/12/2020      Reactions   Clindamycin Hives, Itching, Rash   Prochlorperazine Palpitations   Rapid heart rate, insomnia, made her "super hyper"   Ibandronic Acid    Lower Jaw Pain   Raloxifene    Lower Jaw Pain   Diclofenac Sodium Other (See Comments)   Turns feces white   Penicillin V Potassium Rash      Medication List    STOP taking these medications   acetaminophen 650 MG CR tablet Commonly known  as: TYLENOL   Beano Tabs   Calcium Carbonate-Vitamin D 600-400 MG-UNIT tablet   Delta D3 10 MCG (400 UNIT) Tabs tablet Generic drug: cholecalciferol   docusate sodium 100 MG capsule Commonly known as: COLACE   lactase 3000 units tablet Commonly known as: LACTAID   Tylenol 325 MG Caps Generic drug: Acetaminophen     TAKE these medications   atorvastatin 10 MG tablet Commonly known as: Lipitor Take 1 tablet every day by oral route. What changed:   how much to take  how to take this  when to take this  additional instructions   Denta 5000 Plus 1.1 % Crea dental cream Generic drug: sodium fluoride Place 1 application onto teeth at bedtime.   diphenhydrAMINE 25 mg capsule Commonly known as: BENADRYL Take 25 mg by mouth at bedtime as needed for sleep.   methocarbamol 500 MG tablet Commonly known as: Robaxin Take 1 tablet (500 mg total) by mouth every 8 (eight) hours as needed for up to 5 days for muscle spasms.   ondansetron 4 MG tablet Commonly known as: Zofran Take 1 tablet (4 mg total) by mouth every 8 (eight) hours as needed for nausea or vomiting.   oxyCODONE-acetaminophen 10-325 MG tablet Commonly known as: Percocet Take 1 tablet by mouth every 6 (six) hours as needed for up to 5 days for pain.   PARoxetine 30 MG tablet Commonly known as: Paxil Take 1 tablet every day by oral route. What changed:   how much to take  how to take this  when to take this  additional instructions       Diagnostic Studies: DG Chest 2 View  Result Date: 04/09/2020 CLINICAL DATA:  74 year old female with a history of preoperative chest x-ray EXAM: CHEST - 2 VIEW COMPARISON:  02/29/2020 FINDINGS: Cardiomediastinal silhouette within normal limits, unchanged. No pneumothorax or pleural effusion. No confluent airspace disease. Stigmata of emphysema, with increased retrosternal airspace, flattened hemidiaphragms, increased AP diameter, and hyperinflation on the AP view.  Coarsened interstitial markings. Surgical changes of the cervical region. Degenerative changes of the thoracic spine. No acute displaced fracture. IMPRESSION: Chronic lung changes without evidence of acute cardiopulmonary disease Electronically Signed   By: Corrie Mckusick D.O.   On: 04/09/2020 16:44   DG Lumbar Spine Complete  Result Date: 04/11/2020 CLINICAL DATA:  Surgery, elective. Additional history provided: L5-S1 TLIF, L4-5 left laminotomy and decompression. Provided fluoroscopy time 3 minutes, 15 seconds (150.90 mGy). EXAM: LUMBAR SPINE - COMPLETE 4+ VIEW; DG C-ARM 1-60 MIN COMPARISON:  Lumbar spine MRI 01/11/2019. FINDINGS: PA and lateral view intraoperative fluoroscopic images of the lumbar spine are submitted, 4  images total. The images demonstrate a posterior spinal fusion construct at the L5-S1 level utilizing bilateral pedicle screws and vertical interconnecting rods. An L5-S1 interbody spacer is also present. On the lateral view fluoroscopic images taken at 11:02 a.m., a surgical instrument projects in the region of the L4-L5 neural foramina and there are retractors posterior to the L4-L5 level. IMPRESSION: Four intraoperative fluoroscopic images of the lumbar spine, as detailed. Electronically Signed   By: Kellie Simmering DO   On: 04/11/2020 11:47   DG C-Arm 1-60 Min  Result Date: 04/11/2020 CLINICAL DATA:  Surgery, elective. Additional history provided: L5-S1 TLIF, L4-5 left laminotomy and decompression. Provided fluoroscopy time 3 minutes, 15 seconds (150.90 mGy). EXAM: LUMBAR SPINE - COMPLETE 4+ VIEW; DG C-ARM 1-60 MIN COMPARISON:  Lumbar spine MRI 01/11/2019. FINDINGS: PA and lateral view intraoperative fluoroscopic images of the lumbar spine are submitted, 4 images total. The images demonstrate a posterior spinal fusion construct at the L5-S1 level utilizing bilateral pedicle screws and vertical interconnecting rods. An L5-S1 interbody spacer is also present. On the lateral view  fluoroscopic images taken at 11:02 a.m., a surgical instrument projects in the region of the L4-L5 neural foramina and there are retractors posterior to the L4-L5 level. IMPRESSION: Four intraoperative fluoroscopic images of the lumbar spine, as detailed. Electronically Signed   By: Kellie Simmering DO   On: 04/11/2020 11:47   PCV ECHOCARDIOGRAM COMPLETE  Result Date: 03/17/2020 Echocardiogram 03/15/2020: Normal LV systolic function with visual EF 55-60%. Left ventricle cavity is normal in size. Mild left ventricular hypertrophy. Normal global wall motion. Normal diastolic filling pattern, normal LAP. Mild to moderate mitral regurgitation. Mild tricuspid regurgitation. No evidence of pulmonary hypertension. Moderate pulmonic regurgitation. No prior study for comparison.   PCV MYOCARDIAL PERFUSION WITH LEXISCAN  Result Date: 03/14/2020 Lexiscan Tetrofosmin stress test 03/13/2020: Lexiscan nuclear stress test performed using 1-day protocol. Normal myocardial perfusion. Stress LVEF 73%. Low risk study.    Discharge Instructions    Incentive spirometry RT   Complete by: As directed        Follow-up Information    Melina Schools, MD. Schedule an appointment as soon as possible for a visit in 2 weeks.   Specialty: Orthopedic Surgery Why: If symptoms worsen, For suture removal, For wound re-check Contact information: [address] [place] [address] [phone]               Discharge Plan:  discharge to  home  Disposition: stable    Signed: Yvonne Kendall Loriene Taunton for [facility] PA-C [facility] [phone] 04/12/2020, 8:39 AM

## 2020-04-12 NOTE — Care Management CC44 (Signed)
Condition Code 44 Documentation Completed  Patient Details  Name: Jenny [name] MRN: [id number] Date of Birth: 10/09/1945   Condition Code 44 given:  Yes Patient signature on Condition Code 44 notice:  Yes Documentation of 2 MD's agreement:  Yes Code 44 added to claim:  Yes    Angelita Ingles, RN 04/12/2020, 8:34 AM

## 2020-04-12 NOTE — Evaluation (Signed)
Physical Therapy Evaluation Patient Details Name: Jenny [name] MRN: [id number] DOB: [birth year] Today's Date: 04/12/2020   History of Present Illness  Jenny [name] is an 74 y.o. female who was admitted 04/11/2020 for operative treatment of Degenerative disc disease L5-S1, lumbar stenosis with left radicular leg pain L4-5.   Clinical Impression  Pt admitted with above diagnosis. At the time of PT eval, pt was able to demonstrate transfers and ambulation with gross supervision for safety to modified independence and no AD. Pt was educated on precautions, brace application/wearing schedule, appropriate activity progression, and car transfer. Pt currently with functional limitations due to the deficits listed below (see PT Problem List). Pt will benefit from skilled PT to increase their independence and safety with mobility to allow discharge to the venue listed below.      Follow Up Recommendations No PT follow up;Supervision - Intermittent    Equipment Recommendations  3in1 (PT)    Recommendations for Other Services       Precautions / Restrictions Precautions Precautions: Fall;Back Precaution Booklet Issued: Yes (comment) Required Braces or Orthoses: Spinal Brace Spinal Brace: Thoracolumbosacral orthotic Restrictions Weight Bearing Restrictions: No      Mobility  Bed Mobility Overal bed mobility: Needs Assistance Bed Mobility: Rolling;Sidelying to Sit;Sit to Sidelying Rolling: Supervision Sidelying to sit: Supervision     Sit to sidelying: Supervision General bed mobility comments: VC's for optimal log roll technique. Practiced with HOB flat and rails lowered to simulate home environment.     Transfers Overall transfer level: Modified independent Equipment used: None Transfers: Sit to/from Stand           General transfer comment: Increased time, however no assist required for power-up to full standing position.   Ambulation/Gait Ambulation/Gait assistance:  Supervision Gait Distance (Feet): 350 Feet Assistive device: None Gait Pattern/deviations: Step-through pattern;Decreased stride length;Trunk flexed Gait velocity: Decreased Gait velocity interpretation: 1.31 - 2.62 ft/sec, indicative of limited community ambulator General Gait Details: Slow and guarded with low floor clearance. Pt with no unsteadiness or LOB, however supervision provided for safety.   Stairs Stairs: Yes Stairs assistance: Min guard Stair Management: One rail Left;Step to pattern;Forwards Number of Stairs: 10 General stair comments: VC's for sequencing and general safety. No assist required.   Wheelchair Mobility    Modified Rankin (Stroke Patients Only)       Balance Overall balance assessment: Needs assistance Sitting-balance support: No upper extremity supported;Feet supported Sitting balance-Leahy Scale: Fair     Standing balance support: No upper extremity supported;During functional activity Standing balance-Leahy Scale: Good Standing balance comment: initially reaching out for support but progressed to no UE support needed                              Pertinent Vitals/Pain Pain Assessment: Faces Faces Pain Scale: Hurts a little bit Pain Location: incision site    Home Living Family/patient expects to be discharged to:: Private residence Living Arrangements: Spouse/significant other Available Help at Discharge: Family;Available 24 hours/day (son, daughter in law live nearby and can assist) Type of Home: House Home Access: Stairs to enter Entrance Stairs-Rails: Psychiatric nurse of Steps: 3 Home Layout: One level Home Equipment: Wheelchair - manual;Shower seat - built in;Hand held shower head      Prior Function Level of Independence: Needs assistance   Gait / Transfers Assistance Needed: Assisted with wc mobility outside of the home due to pain with prolonged standing. Held on to husband's arm  for support when  walking at times  ADL's / Homemaking Assistance Needed: Able to complete ADLs, IADLs though difficult due to pain        Hand Dominance   Dominant Hand: Right    Extremity/Trunk Assessment   Upper Extremity Assessment Upper Extremity Assessment: Defer to OT evaluation    Lower Extremity Assessment Lower Extremity Assessment: Generalized weakness (consistent with pre-op diagnosis)    Cervical / Trunk Assessment Cervical / Trunk Assessment: Normal  Communication   Communication: No difficulties  Cognition Arousal/Alertness: Awake/alert Behavior During Therapy: WFL for tasks assessed/performed Overall Cognitive Status: Within Functional Limits for tasks assessed                                        General Comments      Exercises     Assessment/Plan    PT Assessment Patient needs continued PT services  PT Problem List Decreased strength;Decreased activity tolerance;Decreased balance;Decreased mobility;Decreased knowledge of use of DME;Decreased safety awareness;Decreased knowledge of precautions;Pain       PT Treatment Interventions DME instruction;Gait training;Stair training;Therapeutic activities;Balance training;Therapeutic exercise;Functional mobility training;Cognitive remediation;Patient/family education;Neuromuscular re-education    PT Goals (Current goals can be found in the Care Plan section)  Acute Rehab PT Goals Patient Stated Goal: go home with husband today PT Goal Formulation: With patient/family Time For Goal Achievement: 04/19/20 Potential to Achieve Goals: Good    Frequency Min 5X/week   Barriers to discharge        Co-evaluation               AM-PAC PT "6 Clicks" Mobility  Outcome Measure Help needed turning from your back to your side while in a flat bed without using bedrails?: None Help needed moving from lying on your back to sitting on the side of a flat bed without using bedrails?: None Help needed moving  to and from a bed to a chair (including a wheelchair)?: None Help needed standing up from a chair using your arms (e.g., wheelchair or bedside chair)?: None Help needed to walk in hospital room?: None Help needed climbing 3-5 steps with a railing? : A Little 6 Click Score: 23    End of Session Equipment Utilized During Treatment: Gait belt;Back brace Activity Tolerance: Patient tolerated treatment well Patient left: with call bell/phone within reach;with family/visitor present (Sitting EOB) Nurse Communication: Mobility status PT Visit Diagnosis: Unsteadiness on feet (R26.81);Pain Pain - part of body:  (back)    Time: 0768-0881 PT Time Calculation (min) (ACUTE ONLY): 38 min   Charges:   PT Evaluation $PT Eval Low Complexity: 1 Low PT Treatments $Gait Training: 23-37 mins        Rolinda Roan, PT, DPT Acute Rehabilitation Services Pager: [phone] Office: [phone]   Thelma Comp 04/12/2020, 1:27 PM

## 2020-04-15 ENCOUNTER — Other Ambulatory Visit (HOSPITAL_COMMUNITY): Payer: Self-pay | Admitting: Orthopedic Surgery

## 2020-04-15 ENCOUNTER — Ambulatory Visit (HOSPITAL_COMMUNITY)
Admission: RE | Admit: 2020-04-15 | Discharge: 2020-04-15 | Disposition: A | Discharge status: Home / Self Care | Payer: Medicare Other | Source: Ambulatory Visit | Attending: Internal Medicine | Admitting: Internal Medicine

## 2020-04-15 DIAGNOSIS — M79605 Pain in left leg: Secondary | ICD-10-CM

## 2020-04-26 ENCOUNTER — Telehealth: Payer: Self-pay

## 2020-04-26 NOTE — Telephone Encounter (Signed)
Left message

## 2020-04-29 ENCOUNTER — Telehealth: Payer: Self-pay

## 2020-04-29 NOTE — Telephone Encounter (Signed)
Patient stated she is returning Jenny [name]'s call from Friday. She stated she is doing very well and said thank you for asking. YL,RMA

## 2020-05-02 ENCOUNTER — Encounter: Payer: Self-pay | Admitting: Internal Medicine

## 2020-05-02 ENCOUNTER — Ambulatory Visit (INDEPENDENT_AMBULATORY_CARE_PROVIDER_SITE_OTHER): Payer: Medicare Other

## 2020-05-02 ENCOUNTER — Other Ambulatory Visit: Payer: Self-pay

## 2020-05-02 ENCOUNTER — Ambulatory Visit (INDEPENDENT_AMBULATORY_CARE_PROVIDER_SITE_OTHER): Payer: Medicare Other | Admitting: Internal Medicine

## 2020-05-02 VITALS — BP 120/70 | HR 87 | Temp 97.8°F | Ht 65.0 in | Wt 178.8 lb

## 2020-05-02 VITALS — BP 120/70 | HR 87 | Temp 97.8°F | Ht 66.0 in | Wt 178.0 lb

## 2020-05-02 DIAGNOSIS — Z1159 Encounter for screening for other viral diseases: Secondary | ICD-10-CM

## 2020-05-02 DIAGNOSIS — Z0000 Encounter for general adult medical examination without abnormal findings: Secondary | ICD-10-CM

## 2020-05-02 DIAGNOSIS — E7849 Other hyperlipidemia: Secondary | ICD-10-CM

## 2020-05-02 DIAGNOSIS — N182 Chronic kidney disease, stage 2 (mild): Secondary | ICD-10-CM

## 2020-05-02 DIAGNOSIS — M48062 Spinal stenosis, lumbar region with neurogenic claudication: Secondary | ICD-10-CM

## 2020-05-02 NOTE — Progress Notes (Signed)
This visit occurred during the SARS-CoV-2 public health emergency.  Safety protocols were in place, including screening questions prior to the visit, additional usage of staff PPE, and extensive cleaning of exam room while observing appropriate contact time as indicated for disinfecting solutions.  Subjective:   Jenny [name] is a 74 y.o. female who presents for Medicare Annual (Subsequent) preventive examination.  Review of Systems     Cardiac Risk Factors include: advanced age (>35men, >65 women);dyslipidemia;sedentary lifestyle     Objective:    Today's Vitals   05/02/20 1127 05/02/20 1134  BP: 120/70   Pulse: 87   Temp: 97.8 F (36.6 C)   TempSrc: Oral   SpO2: 97%   Weight: 178 lb 12.8 oz (81.1 kg)   Height: 5\' 5"  (1.651 m)   PainSc:  2    Body mass index is 29.75 kg/m.  Advanced Directives 05/02/2020 04/11/2020 04/09/2020  Does Patient Have a Medical Advance Directive? No No No  Would patient like information on creating a medical advance directive? - No - Patient declined No - Patient declined    Current Medications (verified) Outpatient Encounter Medications as of 05/02/2020  Medication Sig  . atorvastatin (LIPITOR) 10 MG tablet Take 1 tablet every day by oral route. (Patient taking differently: Take 10 mg by mouth daily.)  . DENTA 5000 PLUS 1.1 % CREA dental cream Place 1 application onto teeth at bedtime.  . diphenhydrAMINE (BENADRYL) 25 mg capsule Take 25 mg by mouth at bedtime as needed for sleep.   Marland Kitchen PARoxetine (PAXIL) 30 MG tablet Take 1 tablet every day by oral route. (Patient taking differently: Take 30 mg by mouth daily.)  . ondansetron (ZOFRAN) 4 MG tablet Take 1 tablet (4 mg total) by mouth every 8 (eight) hours as needed for nausea or vomiting. (Patient not taking: Reported on 05/02/2020)   No facility-administered encounter medications on file as of 05/02/2020.    Allergies (verified) Clindamycin, Prochlorperazine, Ibandronic acid, Raloxifene,  Diclofenac sodium, and Penicillin v potassium   History: Past Medical History:  Diagnosis Date  . Anxiety   . Arthritis    generalized  . Bursitis of both hips   . Cancer ([place]) 2010   Melanoma Left Arm  . Cataract [id number]  . Chronic kidney disease   . Depression   . Hyperlipidemia   . Hypertension   . Neuromuscular disorder ([place])    Peripheral Nueropathy ( Left Side)  . Plantar fasciitis   . PONV (postoperative nausea and vomiting)    2014 (per pt, hard time waking up)  . Sleep apnea    Past Surgical History:  Procedure Laterality Date  . BACK SURGERY  05/04/2013   Cervical Discectomy with Fusion by Dr.McGovern  . BREAST SURGERY  1980   fibroadenoma  . CATARACT EXTRACTION Left 06/14/2015  . CATARACT EXTRACTION Right 05/08/2015  . CATARACT EXTRACTION, BILATERAL  2017  . CERVICAL SPINE SURGERY  2014   spinal fusion  . MELANOMA EXCISION Left 2010   left arm  . TONSILLECTOMY     removed at age 70  . TRANSFORAMINAL LUMBAR INTERBODY FUSION (TLIF) WITH PEDICLE SCREW FIXATION 1 LEVEL N/A 04/11/2020   Procedure: TRANSFORAMINAL LUMBAR INTERBODY FUSION (TLIF) L5-S1, L4-5 LEFT LAMINOTOMY/ DECOMPRESSION;  Surgeon: Melina Schools, MD;  Location: [facility];  Service: Orthopedics;  Laterality: N/A;  5 hrs  . TUBAL LIGATION     Family History  Problem Relation Age of Onset  . COPD Mother   . Psychosis Mother   . COPD  Father   . Depression Father   . Hypertension Father   . Lupus Daughter   . Depression Paternal Aunt   . Heart disease Paternal Grandmother    Social History   Socioeconomic History  . Marital status: Married    Spouse name: Not on file  . Number of children: 2  . Years of education: Not on file  . Highest education level: Not on file  Occupational History  . Not on file  Tobacco Use  . Smoking status: Former Smoker    Packs/day: 0.25    Years: 2.00    Pack years: 0.50    Types: Cigarettes    Quit date: 09/26/1968    Years since quitting: 51.6  .  Smokeless tobacco: Never Used  Vaping Use  . Vaping Use: Never used  Substance and Sexual Activity  . Alcohol use: Yes    Alcohol/week: 2.0 standard drinks    Types: 1 Glasses of wine, 1 Standard drinks or equivalent per week    Comment: Once a Week  . Drug use: Never  . Sexual activity: Yes    Birth control/protection: Post-menopausal  Other Topics Concern  . Not on file  Social History Narrative  . Not on file   Social Determinants of Health   Financial Resource Strain: Low Risk   . Difficulty of Paying Living Expenses: Not hard at all  Food Insecurity: No Food Insecurity  . Worried About Charity fundraiser in the Last Year: Never true  . Ran Out of Food in the Last Year: Never true  Transportation Needs: No Transportation Needs  . Lack of Transportation (Medical): No  . Lack of Transportation (Non-Medical): No  Physical Activity: Inactive  . Days of Exercise per Week: 0 days  . Minutes of Exercise per Session: 0 min  Stress: No Stress Concern Present  . Feeling of Stress : Not at all  Social Connections: Not on file    Tobacco Counseling Counseling given: Not Answered   Clinical Intake:  Pre-visit preparation completed: Yes  Pain : 0-10 Pain Score: 2  Pain Type: Acute pain Pain Location: Back Pain Orientation: Lower Pain Descriptors / Indicators: Aching Pain Onset: More than a month ago Pain Frequency: Constant     Nutritional Status: BMI 25 -29 Overweight Nutritional Risks: None Diabetes: No  How often do you need to have someone help you when you read instructions, pamphlets, or other written materials from your doctor or pharmacy?: 1 - Never What is the last grade level you completed in school?: college  Diabetic? no  Interpreter Needed?: No  Information entered by :: NAllen LPN   Activities of Daily Living In your present state of health, do you have any difficulty performing the following activities: 05/02/2020 04/11/2020  Hearing? N N   Comment just a little -  Vision? N N  Difficulty concentrating or making decisions? N N  Walking or climbing stairs? N Y  Comment - -  Dressing or bathing? N Y  Doing errands, shopping? N N  Preparing Food and eating ? N -  Using the Toilet? N -  In the past six months, have you accidently leaked urine? Y -  Do you have problems with loss of bowel control? N -  Managing your Medications? N -  Managing your Finances? N -  Some recent data might be hidden    Patient Care Team: Glendale Chard, MD as PCP - General (Internal Medicine)  Indicate any recent Medical Services  you may have received from other than [facility] providers in the past year (date may be approximate).     Assessment:   This is a routine wellness examination for Jenny [name].  Hearing/Vision screen  Hearing Screening   125Hz  250Hz  500Hz  1000Hz  2000Hz  3000Hz  4000Hz  6000Hz  8000Hz   Right ear:           Left ear:           Vision Screening Comments: Regular eye exam, [facility]  Dietary issues and exercise activities discussed: Current Exercise Habits: The patient does not participate in regular exercise at present  Goals    . Patient Stated     05/02/2020, wants to lose 8 pounds      Depression Screen PHQ 2/9 Scores 05/02/2020 02/20/2020 09/27/2019 09/27/2019  PHQ - 2 Score 0 0 0 0  PHQ- 9 Score - 3 0 -    Fall Risk Fall Risk  05/02/2020 09/27/2019  Falls in the past year? 1 1  Comment legs gave out Patient fell out of chair with wheels  Number falls in past yr: 0 0  Injury with Fall? 0 1  Comment - Jammed Thumb  Risk for fall due to : Medication side effect -  Follow up Falls evaluation completed;Education provided;Falls prevention discussed -    FALL RISK PREVENTION PERTAINING TO THE HOME:  Any stairs in or around the home? Yes  If so, are there any without handrails? No  Home free of loose throw rugs in walkways, pet beds, electrical cords, etc? Yes  Adequate lighting in your home to reduce risk of falls? Yes    ASSISTIVE DEVICES UTILIZED TO PREVENT FALLS:  Life alert? No  Use of a cane, walker or w/c? No  Grab bars in the bathroom? No  Shower chair or bench in shower? Yes  Elevated toilet seat or a handicapped toilet? Yes   TIMED UP AND GO:  Was the test performed? No .  .   Gait steady and fast without use of assistive device  Cognitive Function:     6CIT Screen 05/02/2020  What Year? 0 points  What month? 0 points  What time? 0 points  Count back from 20 0 points  Months in reverse 0 points  Repeat phrase 2 points  Total Score 2    Immunizations Immunization History  Administered Date(s) Administered  . Influenza Split 03/28/2010, 02/10/2011, 05/11/2012  . Influenza, High Dose Seasonal PF 02/05/2016, 04/06/2017, 03/16/2018  . Influenza, Quadrivalent, Recombinant, Inj, Pf 03/20/2019  . Influenza, Seasonal, Injecte, Preservative Fre 04/04/2013  . Influenza,inj,Quad PF,6+ Mos 04/11/2014, 03/15/2015  . PFIZER SARS-COV-2 Vaccination 07/24/2019, 08/14/2019  . Pneumococcal Conjugate-13 07/13/2014  . Pneumococcal Polysaccharide-23 02/05/2016  . Pneumococcal-Unspecified 03/30/2008  . Tdap 05/11/2011  . Zoster 04/08/2016    TDAP status: Up to date  Flu Vaccine status: Up to date  Pneumococcal vaccine status: Up to date  Covid-19 vaccine status: Completed vaccines  Qualifies for Shingles Vaccine? Yes   Zostavax completed Yes   Shingrix Completed?: No.    Education has been provided regarding the importance of this vaccine. Patient has been advised to call insurance company to determine out of pocket expense if they have not yet received this vaccine. Advised may also receive vaccine at local pharmacy or Health Dept. Verbalized acceptance and understanding.  Screening Tests Health Maintenance  Topic Date Due  . COVID-19 Vaccine (3 - Pfizer risk 4-dose series) 09/11/2019  . INFLUENZA VACCINE  08/22/2020 (Originally 12/24/2019)  . MAMMOGRAM  03/19/2021  .  TETANUS/TDAP   05/10/2021  . COLONOSCOPY  02/14/2022  . DEXA SCAN  Completed  . Hepatitis C Screening  Completed  . PNA vac Low Risk Adult  Completed    Health Maintenance  Health Maintenance Due  Topic Date Due  . COVID-19 Vaccine (3 - Pfizer risk 4-dose series) 09/11/2019    Colorectal cancer screening: Type of screening: Colonoscopy. Completed 02/15/2012. Repeat every 10 years  Mammogram status: Completed 2021. Repeat every year  Bone Density status: Ordered 10/02/2019. Pt provided with contact info and advised to call to schedule appt.  Lung Cancer Screening: (Low Dose CT Chest recommended if Age 72-80 years, 30 pack-year currently smoking OR have quit w/in 15years.) does not qualify.   Lung Cancer Screening Referral: no   Additional Screening:  Hepatitis C Screening: does qualify; Completed today  Vision Screening: Recommended annual ophthalmology exams for early detection of glaucoma and other disorders of the eye. Is the patient up to date with their annual eye exam?  Yes  Who is the provider or what is the name of the office in which the patient attends annual eye exams? [facility] If pt is not established with a provider, would they like to be referred to a provider to establish care? No .   Dental Screening: Recommended annual dental exams for proper oral hygiene  Community Resource Referral / Chronic Care Management: CRR required this visit?  No   CCM required this visit?  No      Plan:     I have personally reviewed and noted the following in the patient's chart:   . Medical and social history . Use of alcohol, tobacco or illicit drugs  . Current medications and supplements . Functional ability and status . Nutritional status . Physical activity . Advanced directives . List of other physicians . Hospitalizations, surgeries, and ER visits in previous 12 months . Vitals . Screenings to include cognitive, depression, and falls . Referrals and appointments  In  addition, I have reviewed and discussed with patient certain preventive protocols, quality metrics, and best practice recommendations. A written personalized care plan for preventive services as well as general preventive health recommendations were provided to patient.     Kellie Simmering, LPN   [id number]   Nurse Notes:

## 2020-05-02 NOTE — Progress Notes (Signed)
Rutherford Nail as a scribe for Maximino Greenland, MD.,have documented all relevant documentation on the behalf of Maximino Greenland, MD,as directed by  Maximino Greenland, MD while in the presence of Maximino Greenland, MD. This visit occurred during the SARS-CoV-2 public health emergency.  Safety protocols were in place, including screening questions prior to the visit, additional usage of staff PPE, and extensive cleaning of exam room while observing appropriate contact time as indicated for disinfecting solutions.  Subjective:     Patient ID: Jenny [name] , female    DOB: May 19, 1946 , 74 y.o.   MRN: [id number]   Chief Complaint  Patient presents with  . Hyperlipidemia    HPI  Pt is here today for a cholesterol check.  She is currently taking atorvastatin without any issues. Since her last visit, she has been hospitalized for elective surgery. She was admitted 04/11/2020 for operative treatment of degenerative disc disease L5-S1 and lumbar stenosis with left radicular leg pain L4-5.  After pre-op clearance, the patient was taken to the operating room on 04/11/2020 and underwent: TRANSFORAMINAL LUMBAR INTERBODY FUSION (TLIF) L5-S1, L4-5 LEFT LAMINOTOMY. Her hospital course was uneventful and she was d/c'd on 04/12/20. She has had improvement in her discomfort since the surgery.     Past Medical History:  Diagnosis Date  . Anxiety   . Arthritis    generalized  . Bursitis of both hips   . 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 Cataract [id number]  . Chronic kidney disease   . Depression   . Hyperlipidemia   . Hypertension   . Neuromuscular disorder ([place])    Peripheral Nueropathy ( Left Side)  . Plantar fasciitis   . PONV (postoperative nausea and vomiting)    2014 (per pt, hard time waking up)  . Sleep apnea      Family History  Problem Relation Age of Onset  . COPD Mother   . Psychosis Mother   . COPD Father   . Depression Father   . Hypertension Father   . Lupus  Daughter   . Depression Paternal Aunt   . Heart disease Paternal Grandmother      Current Outpatient Medications:  .  atorvastatin (LIPITOR) 10 MG tablet, Take 1 tablet every day by oral route. (Patient taking differently: Take 10 mg by mouth daily.), Disp: 90 tablet, Rfl: 1 .  DENTA 5000 PLUS 1.1 % CREA dental cream, Place 1 application onto teeth at bedtime., Disp: 51 g, Rfl: 3 .  diphenhydrAMINE (BENADRYL) 25 mg capsule, Take 25 mg by mouth at bedtime as needed for sleep. , Disp: , Rfl:  .  ondansetron (ZOFRAN) 4 MG tablet, Take 1 tablet (4 mg total) by mouth every 8 (eight) hours as needed for nausea or vomiting. (Patient not taking: Reported on 05/02/2020), Disp: 20 tablet, Rfl: 0 .  PARoxetine (PAXIL) 30 MG tablet, Take 1 tablet every day by oral route. (Patient taking differently: Take 30 mg by mouth daily.), Disp: 90 tablet, Rfl: 1   Allergies  Allergen Reactions  . Clindamycin Hives, Itching and Rash  . Prochlorperazine Palpitations    Rapid heart rate, insomnia, made her "super hyper"  . Ibandronic Acid     Lower Jaw Pain  . Raloxifene     Lower Jaw Pain  . Diclofenac Sodium Other (See Comments)    Turns feces white  . Penicillin V Potassium Rash     Review of Systems  Constitutional: Negative.  Negative  for fatigue.  HENT: Negative.   Respiratory: Negative.   Cardiovascular: Negative.   Endocrine: Negative for polydipsia, polyphagia and polyuria.  Musculoskeletal: Negative.   Skin: Negative.   Neurological: Negative for dizziness and headaches.  Psychiatric/Behavioral: Negative.      Today's Vitals   05/02/20 1138  BP: 120/70  Pulse: 87  Temp: 97.8 F (36.6 C)  TempSrc: Oral  SpO2: 97%  Weight: 178 lb (80.7 kg)  Height: 5' 6" (1.676 m)   Body mass index is 28.73 kg/m.   Wt Readings from Last 3 Encounters:  05/02/20 178 lb (80.7 kg)  05/02/20 178 lb 12.8 oz (81.1 kg)  04/11/20 179 lb 14.3 oz (81.6 kg)    Objective:  Physical Exam Vitals and  nursing note reviewed.  Constitutional:      Appearance: Normal appearance.  HENT:     Head: Normocephalic and atraumatic.  Cardiovascular:     Rate and Rhythm: Normal rate and regular rhythm.     Heart sounds: Normal heart sounds.  Pulmonary:     Effort: Pulmonary effort is normal.     Breath sounds: Normal breath sounds.  Skin:    General: Skin is warm.  Neurological:     General: No focal deficit present.     Mental Status: She is alert.  Psychiatric:        Mood and Affect: Mood normal.        Behavior: Behavior normal.         Assessment And Plan:     1. Other hyperlipidemia Comments: Chronic, I did review labwork performed earlier in the year. Advised to c/w current meds, avoid fried foods and gradually increase daily activity as tolerated.   2. Chronic kidney disease, stage II (mild) Comments: Chronic, encouraged to stay well hydrated. I will check renal function today.  - CMP14+EGFR  3. Encounter for HCV screening test for low risk patient Comments: Will be checked per [facility] advisor.   4. Spinal stenosis of lumbar region with neurogenic claudication Comments: Discharge summary reviewed in detail. She is s/p lumbar fusion and left laminotomy. As per Ortho.  Patient was given opportunity to ask questions. Patient verbalized understanding of the plan and was able to repeat key elements of the plan. All questions were answered to their satisfaction.  Maximino Greenland, MD   I, Maximino Greenland, MD, have reviewed all documentation for this visit. The documentation on 05/25/20 for the exam, diagnosis, procedures, and orders are all accurate and complete.  THE PATIENT IS ENCOURAGED TO PRACTICE SOCIAL DISTANCING DUE TO THE COVID-19 PANDEMIC.

## 2020-05-02 NOTE — Patient Instructions (Signed)
Jenny [name] , Thank you for taking time to come for your Medicare Wellness Visit. I appreciate your ongoing commitment to your health goals. Please review the following plan we discussed and let me know if I can assist you in the future.   Screening recommendations/referrals: Colonoscopy: completed 02/15/2012, due 02/14/2022 Mammogram: states had this year Bone Density: completed 06/16/2010 Recommended yearly ophthalmology/optometry visit for glaucoma screening and checkup Recommended yearly dental visit for hygiene and checkup  Vaccinations: Influenza vaccine: states had in Sept Pneumococcal vaccine: completed 02/05/2016 Tdap vaccine: completed 05/11/2011, due 05/10/2021 Shingles vaccine: discussed   Covid-19: 08/14/2019, 07/24/2019  Advanced directives: Advance directive discussed with you today. Even though you declined this today please call our office should you change your mind and we can give you the proper paperwork for you to fill out.  Conditions/risks identified: none  Next appointment: 10/31/2020 at 11:15 Follow up in one year for your annual wellness visit    Preventive Care 65 Years and Older, Female Preventive care refers to lifestyle choices and visits with your health care provider that can promote health and wellness. What does preventive care include?  A yearly physical exam. This is also called an annual well check.  Dental exams once or twice a year.  Routine eye exams. Ask your health care provider how often you should have your eyes checked.  Personal lifestyle choices, including:  Daily care of your teeth and gums.  Regular physical activity.  Eating a healthy diet.  Avoiding tobacco and drug use.  Limiting alcohol use.  Practicing safe sex.  Taking low-dose aspirin every day.  Taking vitamin and mineral supplements as recommended by your health care provider. What happens during an annual well check? The services and screenings done by your  health care provider during your annual well check will depend on your age, overall health, lifestyle risk factors, and family history of disease. Counseling  Your health care provider may ask you questions about your:  Alcohol use.  Tobacco use.  Drug use.  Emotional well-being.  Home and relationship well-being.  Sexual activity.  Eating habits.  History of falls.  Memory and ability to understand (cognition).  Work and work Statistician.  Reproductive health. Screening  You may have the following tests or measurements:  Height, weight, and BMI.  Blood pressure.  Lipid and cholesterol levels. These may be checked every 5 years, or more frequently if you are over 21 years old.  Skin check.  Lung cancer screening. You may have this screening every year starting at age 70 if you have a 30-pack-year history of smoking and currently smoke or have quit within the past 15 years.  Fecal occult blood test (FOBT) of the stool. You may have this test every year starting at age 7.  Flexible sigmoidoscopy or colonoscopy. You may have a sigmoidoscopy every 5 years or a colonoscopy every 10 years starting at age 45.  Hepatitis C blood test.  Hepatitis B blood test.  Sexually transmitted disease (STD) testing.  Diabetes screening. This is done by checking your blood sugar (glucose) after you have not eaten for a while (fasting). You may have this done every 1-3 years.  Bone density scan. This is done to screen for osteoporosis. You may have this done starting at age 22.  Mammogram. This may be done every 1-2 years. Talk to your health care provider about how often you should have regular mammograms. Talk with your health care provider about your test results, treatment options,  and if necessary, the need for more tests. Vaccines  Your health care provider may recommend certain vaccines, such as:  Influenza vaccine. This is recommended every year.  Tetanus, diphtheria, and  acellular pertussis (Tdap, Td) vaccine. You may need a Td booster every 10 years.  Zoster vaccine. You may need this after age 41.  Pneumococcal 13-valent conjugate (PCV13) vaccine. One dose is recommended after age 19.  Pneumococcal polysaccharide (PPSV23) vaccine. One dose is recommended after age 21. Talk to your health care provider about which screenings and vaccines you need and how often you need them. This information is not intended to replace advice given to you by your health care provider. Make sure you discuss any questions you have with your health care provider. Document Released: 06/07/2015 Document Revised: 01/29/2016 Document Reviewed: 03/12/2015 Elsevier Interactive Patient Education  2017 [place] Prevention in the Home Falls can cause injuries. They can happen to people of all ages. There are many things you can do to make your home safe and to help prevent falls. What can I do on the outside of my home?  Regularly fix the edges of walkways and driveways and fix any cracks.  Remove anything that might make you trip as you walk through a door, such as a raised step or threshold.  Trim any bushes or trees on the path to your home.  Use bright outdoor lighting.  Clear any walking paths of anything that might make someone trip, such as rocks or tools.  Regularly check to see if handrails are loose or broken. Make sure that both sides of any steps have handrails.  Any raised decks and porches should have guardrails on the edges.  Have any leaves, snow, or ice cleared regularly.  Use sand or salt on walking paths during winter.  Clean up any spills in your garage right away. This includes oil or grease spills. What can I do in the bathroom?  Use night lights.  Install grab bars by the toilet and in the tub and shower. Do not use towel bars as grab bars.  Use non-skid mats or decals in the tub or shower.  If you need to sit down in the shower, use  a plastic, non-slip stool.  Keep the floor dry. Clean up any water that spills on the floor as soon as it happens.  Remove soap buildup in the tub or shower regularly.  Attach bath mats securely with double-sided non-slip rug tape.  Do not have throw rugs and other things on the floor that can make you trip. What can I do in the bedroom?  Use night lights.  Make sure that you have a light by your bed that is easy to reach.  Do not use any sheets or blankets that are too big for your bed. They should not hang down onto the floor.  Have a firm chair that has side arms. You can use this for support while you get dressed.  Do not have throw rugs and other things on the floor that can make you trip. What can I do in the kitchen?  Clean up any spills right away.  Avoid walking on wet floors.  Keep items that you use a lot in easy-to-reach places.  If you need to reach something above you, use a strong step stool that has a grab bar.  Keep electrical cords out of the way.  Do not use floor polish or wax that makes floors slippery. If  you must use wax, use non-skid floor wax.  Do not have throw rugs and other things on the floor that can make you trip. What can I do with my stairs?  Do not leave any items on the stairs.  Make sure that there are handrails on both sides of the stairs and use them. Fix handrails that are broken or loose. Make sure that handrails are as long as the stairways.  Check any carpeting to make sure that it is firmly attached to the stairs. Fix any carpet that is loose or worn.  Avoid having throw rugs at the top or bottom of the stairs. If you do have throw rugs, attach them to the floor with carpet tape.  Make sure that you have a light switch at the top of the stairs and the bottom of the stairs. If you do not have them, ask someone to add them for you. What else can I do to help prevent falls?  Wear shoes that:  Do not have high heels.  Have  rubber bottoms.  Are comfortable and fit you well.  Are closed at the toe. Do not wear sandals.  If you use a stepladder:  Make sure that it is fully opened. Do not climb a closed stepladder.  Make sure that both sides of the stepladder are locked into place.  Ask someone to hold it for you, if possible.  Clearly mark and make sure that you can see:  Any grab bars or handrails.  First and last steps.  Where the edge of each step is.  Use tools that help you move around (mobility aids) if they are needed. These include:  Canes.  Walkers.  Scooters.  Crutches.  Turn on the lights when you go into a dark area. Replace any light bulbs as soon as they burn out.  Set up your furniture so you have a clear path. Avoid moving your furniture around.  If any of your floors are uneven, fix them.  If there are any pets around you, be aware of where they are.  Review your medicines with your doctor. Some medicines can make you feel dizzy. This can increase your chance of falling. Ask your doctor what other things that you can do to help prevent falls. This information is not intended to replace advice given to you by your health care provider. Make sure you discuss any questions you have with your health care provider. Document Released: 03/07/2009 Document Revised: 10/17/2015 Document Reviewed: 06/15/2014 Elsevier Interactive Patient Education  2017 [employer].

## 2020-05-02 NOTE — Patient Instructions (Signed)

## 2020-05-03 LAB — HEPATITIS C ANTIBODY: Hep C Virus Ab: <0.1 s/co ratio (ref 0.0–0.9)

## 2020-05-08 ENCOUNTER — Ambulatory Visit: Payer: Medicare Other | Admitting: Internal Medicine

## 2020-05-09 LAB — CMP14+EGFR
ALT: 20 IU/L (ref 0–32)
AST: 19 IU/L (ref 0–40)
Albumin/Globulin Ratio: 1.4 (ref 1.2–2.2)
Albumin: 3.8 g/dL (ref 3.7–4.7)
Alkaline Phosphatase: 129 IU/L — ABNORMAL HIGH (ref 44–121)
BUN/Creatinine Ratio: 26 (ref 12–28)
BUN: 24 mg/dL (ref 8–27)
Bilirubin Total: 0.2 mg/dL (ref 0.0–1.2)
CO2: 22 mmol/L (ref 20–29)
Calcium: 9.2 mg/dL (ref 8.7–10.3)
Chloride: 103 mmol/L (ref 96–106)
Creatinine, Ser: 0.93 mg/dL (ref 0.57–1.00)
GFR calc Af Amer: 70 mL/min/{1.73_m2} (ref >59)
GFR calc non Af Amer: 61 mL/min/{1.73_m2} (ref >59)
Globulin, Total: 2.7 g/dL (ref 1.5–4.5)
Glucose: 91 mg/dL (ref 65–99)
Potassium: 5.2 mmol/L (ref 3.5–5.2)
Sodium: 141 mmol/L (ref 134–144)
Total Protein: 6.5 g/dL (ref 6.0–8.5)

## 2020-05-09 LAB — SPECIMEN STATUS REPORT

## 2020-09-24 ENCOUNTER — Other Ambulatory Visit: Payer: Self-pay | Admitting: Internal Medicine

## 2020-09-30 ENCOUNTER — Other Ambulatory Visit: Payer: Self-pay | Admitting: Orthopedic Surgery

## 2020-09-30 DIAGNOSIS — M259 Joint disorder, unspecified: Secondary | ICD-10-CM

## 2020-10-09 ENCOUNTER — Ambulatory Visit
Admission: RE | Admit: 2020-10-09 | Discharge: 2020-10-09 | Disposition: A | Discharge status: Home / Self Care | Payer: Medicare Other | Source: Ambulatory Visit | Attending: Orthopedic Surgery | Admitting: Orthopedic Surgery

## 2020-10-09 ENCOUNTER — Other Ambulatory Visit: Payer: Self-pay

## 2020-10-09 DIAGNOSIS — M259 Joint disorder, unspecified: Secondary | ICD-10-CM

## 2020-10-31 ENCOUNTER — Ambulatory Visit: Payer: Medicare Other | Admitting: Internal Medicine

## 2020-11-06 ENCOUNTER — Other Ambulatory Visit: Payer: Self-pay

## 2020-11-06 ENCOUNTER — Ambulatory Visit (HOSPITAL_BASED_OUTPATIENT_CLINIC_OR_DEPARTMENT_OTHER): Payer: Medicare Other | Attending: Orthopedic Surgery | Admitting: Physical Therapy

## 2020-11-06 DIAGNOSIS — M6281 Muscle weakness (generalized): Secondary | ICD-10-CM | POA: Insufficient documentation

## 2020-11-06 DIAGNOSIS — R2689 Other abnormalities of gait and mobility: Secondary | ICD-10-CM | POA: Diagnosis present

## 2020-11-06 NOTE — Therapy (Signed)
[place], [place], [address] Phone: [phone]   Fax:  [phone]  Physical Therapy Evaluation  Patient Details  Name: Jenny [name] MRN: [id number] Date of Birth: 02-15-1946 Referring Provider (PT): Melina Schools, MD   Encounter Date: 11/06/2020   PT End of Session - 11/06/20 1512     Visit Number 1    Number of Visits 12    Date for PT Re-Evaluation 12/18/20    Authorization Type Medicare    Progress Note Due on Visit 10    PT Start Time 1515    PT Stop Time 1600    PT Time Calculation (min) 45 min    Activity Tolerance Patient tolerated treatment well    Behavior During Therapy [facility] for tasks assessed/performed             Past Medical History:  Diagnosis Date   Anxiety    Arthritis    generalized   Bursitis of both hips    Cancer ([place]) 2010   Melanoma Left Arm   Cataract 2016,2017   Chronic kidney disease    Depression    Hyperlipidemia    Hypertension    Neuromuscular disorder ([place])    Peripheral Nueropathy ( Left Side)   Plantar fasciitis    PONV (postoperative nausea and vomiting)    2014 (per pt, hard time waking up)   Sleep apnea     Past Surgical History:  Procedure Laterality Date   BACK SURGERY  05/04/2013   Cervical Discectomy with Fusion by [place]   fibroadenoma   CATARACT EXTRACTION Left 06/14/2015   CATARACT EXTRACTION Right 05/08/2015   CATARACT EXTRACTION, BILATERAL  2017   CERVICAL SPINE SURGERY  2014   spinal fusion   MELANOMA EXCISION Left 2010   left arm   TONSILLECTOMY     removed at age 76   TRANSFORAMINAL LUMBAR INTERBODY FUSION (TLIF) WITH PEDICLE SCREW FIXATION 1 LEVEL N/A 04/11/2020   Procedure: TRANSFORAMINAL LUMBAR INTERBODY FUSION (TLIF) L5-S1, L4-5 LEFT LAMINOTOMY/ DECOMPRESSION;  Surgeon: Melina Schools, MD;  Location: [facility];  Service: Orthopedics;  Laterality: N/A;  5 hrs   TUBAL LIGATION      There  were no vitals filed for this visit.    Subjective Assessment - 11/06/20 1519     Subjective Pt reports Apr 2020 she bent over to pick up laundry and felt something in her back. She was too nervous to do any surgery due to COVID. Nov 2021 she got a transforaminal intralumbar fusion. She was doing very well at 3 month check up. When she was home in [place] she thought she had bursitis and got steroid injection which did not work. Last Wed she got steroid injection in her SIJ and she is pain free. MD recommended aquatic therapy and she has done it in the past. Notes that her stamina has decreased (normally able to walk 20 minutes a day). Has not been able to sleep on her R side which is her preferred side for sleeping because of previous pain and worries that she will re-exacerbate her SI joint.    Limitations Walking;House hold activities;Lifting    How long can you sit comfortably? n/a    How long can you stand comfortably? n/a    How long can you walk comfortably? 5 minutes max of walking in backyard    Patient Stated Goals Continue to maintain back pain and ensure pain does not come  back; improve stamina    Currently in Pain? No/denies                [facility] PT Assessment - 11/06/20 0001       Assessment   Medical Diagnosis Lumbar/R SIJ    Referring Provider (PT) Melina Schools, MD    Prior Therapy Aquatics before for neck surgery 2014; was seeing [facility] prior to coming to [facility] for aquatics earlier this year      Precautions   Precautions None      Restrictions   Weight Bearing Restrictions No      Balance Screen   Has the patient fallen in the past 6 months No    Has the patient had a decrease in activity level because of a fear of falling?  No    Is the patient reluctant to leave their home because of a fear of falling?  No      Home Ecologist residence    Living Arrangements Spouse/significant other    Available Help at  Discharge Family    Type of [place] to enter    Entrance Stairs-Number of Steps [place] One level    Additional Comments Currently moving back and forth between [place] and 2nd home out of state      Prior Function   Level of [employer] Retired    Leisure Staying active      Observation/Other Assessments   Focus on Therapeutic Outcomes (FOTO)  n/a      Sensation   Light Touch --   Peripheral neuropathy L leg 2014     Functional Tests   Functional tests Single leg stance      Single Leg Stance   Comments L LE: 6 sec; R LE: 4 sec      ROM / Strength   AROM / PROM / Strength Strength      Strength   Overall Strength Comments bilat hips grossly 4-/5; knee ext 4+/5, knee flex 4/5      Flexibility   Soft Tissue Assessment /Muscle Length yes    Hamstrings ~60 deg on R; 80 deg on L      Standardized Balance Assessment   Standardized Balance Assessment Five Times Sit to Stand    Five times sit to stand comments  13 sec                        Objective measurements completed on examination: See above findings.               PT Education - 11/06/20 1550     Education Details Discussed exam findings and POC    Person(s) Educated Patient    Methods Explanation;Demonstration;Verbal cues    Comprehension Verbalized understanding;Returned demonstration;Need further instruction;Verbal cues required              PT Short Term Goals - 11/06/20 1650       PT SHORT TERM GOAL #1   Title Pt will be independent with HEP    Time 3    Period Weeks    Status New    Target Date 11/27/20      PT SHORT TERM GOAL #2   Title Pt will be able to tolerate walking at least 10 minutes to demo improved endurance    Baseline Reports only able to walk ~5  min in her yard    Time 3    Period Weeks    Status New    Target Date 11/27/20               PT Long Term Goals - 11/06/20 1651       PT LONG  TERM GOAL #1   Title Pt will be independent with her final HEP and transition to community program    Time 6    Period Weeks    Status New    Target Date 12/18/20      PT LONG TERM GOAL #2   Title Pt will be able to perform SLS for at least 10 sec R & L LE to demo improved LE stability, strength, and balance    Baseline 4-6 sec only    Time 6    Period Weeks    Status New    Target Date 12/18/20      PT LONG TERM GOAL #3   Title Pt will remain pain free in her back    Time 6    Period Weeks    Status New    Target Date 12/18/20      PT LONG TERM GOAL #4   Title Pt will be able to safely lift at least 20# with proper body mechanics to reduce chances of future back injury    Time 6    Period Weeks    Status New    Target Date 12/18/20      PT LONG TERM GOAL #5   Title Pt will be able to return to ambulating 20 min/day    Time 6    Period Weeks    Status New    Target Date 12/18/20                    Plan - 11/06/20 1554     Clinical Impression Statement Mrs. Jenny [name] is a 75 y/o F presenting to OPPT s/p R SIJ injection with history of low back pain. R SIJ injection has greatly improved her pain and she is now pain free. Pt has had LBP since Apr 2020 after trying to lift her laundry basket. PMH significant for L4-5 fusion in Nov 2021. Most major complaint is her generalized weakness and decreased endurance since the start of her ordeal. Pt with goals to return to her baseline status and perform aquatic therapy. Pt would highly benefit from PT for strength/conditioning and further prevent back injury and pain.    Personal Factors and Comorbidities Age;Time since onset of injury/illness/exacerbation;Past/Current Experience    Examination-Activity Limitations Locomotion Level;Lift;Sleep    Examination-Participation Restrictions Yard Work;Shop;Cleaning;Laundry    Stability/Clinical Decision Making Stable/Uncomplicated    Clinical Decision Making Low    Rehab  Potential Good    PT Frequency 2x / week    PT Duration 6 weeks    PT Treatment/Interventions Aquatic Therapy;ADLs/Self Care Home Management;Moist Heat;Electrical Stimulation;Cryotherapy;Gait training;Stair training;Functional mobility training;Therapeutic activities;Therapeutic exercise;Balance training;Neuromuscular re-education;Manual techniques;Patient/family education;Passive range of motion;Dry needling;Taping;Vasopneumatic Device;Spinal Manipulations;Joint Manipulations    PT Next Visit Plan Initiate core stabilization and general LE strengthening. Work on Economist for safe lifting.    PT Home Exercise Plan Pt to try and sleep on her R side & 5 min of using driving lawnmower and see if pain is re-exacerbated    Consulted and Agree with Plan of Care Patient             Patient will benefit  from skilled therapeutic intervention in order to improve the following deficits and impairments:  Decreased endurance, Decreased strength, Decreased mobility, Improper body mechanics, Impaired sensation  Visit Diagnosis: Muscle weakness (generalized)  Other abnormalities of gait and mobility     Problem List Patient Active Problem List   Diagnosis Date Noted   S/P lumbar fusion 04/11/2020   Pre-op evaluation 03/07/2020   Abnormal EKG 03/07/2020   Blood glucose abnormal 03/21/2019   Cardiovascular stress test abnormal 03/21/2019   Cervical disc disorder 03/21/2019   Disc disorder of lumbar region 03/21/2019   Depressive disorder 03/21/2019   Disorder of bone and articular cartilage 03/21/2019   Hyperlipidemia 03/21/2019   Impaired fasting glucose 03/21/2019   Malignant melanoma of skin of lower limb (HCC) 03/21/2019   Mammogram abnormal 03/21/2019   Numbness of lower limb 03/21/2019   Osteopenia 03/21/2019   Seborrheic keratosis 03/21/2019   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   Varicose veins of lower extremity 03/21/2019   Vitamin D deficiency 03/21/2019   Spinal  stenosis of lumbar region 01/20/2019   Bursitis of left hip 01/03/2019   Peripheral neuropathic pain 01/03/2019   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   Need for immunization against influenza 03/16/2018   Proteinuria 09/29/2017   Chronic kidney disease, stage 2 (mild) 09/29/2017   Overweight (BMI 25.0-29.9) 04/12/2017   Preop cardiovascular exam 12/17/2016   Prediabetes 12/17/2016   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

## 2020-11-06 NOTE — Patient Instructions (Signed)
  Aquatic Therapy: What to Expect!  Where:  [facility] [address] [place], [place]  [address] [phone]  NOTE:  You will receive an automated phone message reminding you of your appointment and it will say the appointment is at the [facility] on [address]  We are working to fix this- just know that you will meet [place] at the pool!  How to Prepare: Please make sure you drink 8 ounces of water about one hour prior to your pool session A caregiver MUST attend the entire session with the patient.  The caregiver will be responsible for assisting with dressing as well as any toileting needs.  If the patient will be doing a home program this should likely be the person who will assist as well.  Patients must wear either their street shoes or pool shoes until they are ready to enter the pool with the therapist.  Patients must also wear either street shoes or pool shoes once exiting the pool to walk to the locker room.  This will helps [place] prevent slips and falls.  Please arrive 15 minutes early to prepare for your pool therapy session Sign in at the front desk on the clipboard marked for [place] You may use the locker rooms on your right and then enter directly into the recreation pool (NOT the competition pool) Please make sure to attend to any toileting needs prior to entering the pool Please be dressed in your swim suit and on the pool deck at least 5 minutes before your appointment Once on the pool deck your therapist will ask you to sign the Patient  Consent and Assignment of Benefits form Your therapist may take your blood pressure prior to, during and after your session if indicated  About the pool  and parking: Entering the pool Your therapist will assist you; there are 2 ways to enter:  stairs with railings or with a chair lift.   Your therapist will determine the most appropriate way for you. Water temperature is usually between 86-87 degrees There  may be other swimmers in the pool at the same time Parking is free.

## 2020-11-06 NOTE — Addendum Note (Signed)
Addended by: Colbert Ewing MARIE L on: 11/06/2020 05:02 PM   Modules accepted: Orders

## 2020-11-08 ENCOUNTER — Other Ambulatory Visit: Payer: Self-pay

## 2020-11-08 ENCOUNTER — Encounter (HOSPITAL_BASED_OUTPATIENT_CLINIC_OR_DEPARTMENT_OTHER): Payer: Self-pay | Admitting: Physical Therapy

## 2020-11-08 ENCOUNTER — Ambulatory Visit (HOSPITAL_BASED_OUTPATIENT_CLINIC_OR_DEPARTMENT_OTHER): Payer: Medicare Other | Admitting: Physical Therapy

## 2020-11-08 DIAGNOSIS — M6281 Muscle weakness (generalized): Secondary | ICD-10-CM | POA: Diagnosis not present

## 2020-11-08 DIAGNOSIS — R2689 Other abnormalities of gait and mobility: Secondary | ICD-10-CM

## 2020-11-09 ENCOUNTER — Encounter (HOSPITAL_BASED_OUTPATIENT_CLINIC_OR_DEPARTMENT_OTHER): Payer: Self-pay | Admitting: Physical Therapy

## 2020-11-09 NOTE — Therapy (Signed)
[place], [place], [address] Phone: [phone]   Fax:  [phone]  Physical Therapy Treatment  Patient Details  Name: Jenny [name] MRN: [id number] Date of Birth: [birth year] Referring Provider (PT): Melina Schools, MD   Encounter Date: 11/08/2020   PT End of Session - 11/09/20 0858     Visit Number 2    Number of Visits 12    Date for PT Re-Evaluation 12/18/20    Authorization Type Medicare    PT Start Time 0800    PT Stop Time 0842    PT Time Calculation (min) 42 min    Activity Tolerance Patient tolerated treatment well    Behavior During Therapy [facility] for tasks assessed/performed             Past Medical History:  Diagnosis Date   Anxiety    Arthritis    generalized   Bursitis of both hips    Cancer ([place]) 2010   Melanoma Left Arm   Cataract 2016,2017   Chronic kidney disease    Depression    Hyperlipidemia    Hypertension    Neuromuscular disorder ([place])    Peripheral Nueropathy ( Left Side)   Plantar fasciitis    PONV (postoperative nausea and vomiting)    2014 (per pt, hard time waking up)   Sleep apnea     Past Surgical History:  Procedure Laterality Date   BACK SURGERY  05/04/2013   Cervical Discectomy with Fusion by [place]   fibroadenoma   CATARACT EXTRACTION Left 06/14/2015   CATARACT EXTRACTION Right 05/08/2015   CATARACT EXTRACTION, BILATERAL  2017   CERVICAL SPINE SURGERY  2014   spinal fusion   MELANOMA EXCISION Left 2010   left arm   TONSILLECTOMY     removed at age 75   TRANSFORAMINAL LUMBAR INTERBODY FUSION (TLIF) WITH PEDICLE SCREW FIXATION 1 LEVEL N/A 04/11/2020   Procedure: TRANSFORAMINAL LUMBAR INTERBODY FUSION (TLIF) L5-S1, L4-5 LEFT LAMINOTOMY/ DECOMPRESSION;  Surgeon: Melina Schools, MD;  Location: [facility];  Service: Orthopedics;  Laterality: N/A;  5 hrs   TUBAL LIGATION      There were no vitals filed for this  visit.   Subjective Assessment - 11/09/20 2036     Subjective Patient reports she had maybe a .5/10 pain in her SI joint. She feels like her balance has been a little off as well.    Limitations Walking;House hold activities;Lifting    How long can you sit comfortably? n/a    How long can you stand comfortably? n/a    How long can you walk comfortably? 5 minutes max of walking in backyard    Patient Stated Goals Continue to maintain back pain and ensure pain does not come back; improve stamina    Currently in Pain? No/denies                    Pt seen for aquatic therapy today.  Treatment took place in water 3.25-4 ft in depth at the [employer] pool. Temp of water was 91.  Pt entered/exited the pool via stairs (step through pattern) independently with bilat rail.  Introduction to water. Had patient stand at different levels so she could feel the bouncy   Warm up: heel/toe walking x4 laps across pool chest deep side stepping x4 laps from shallow to deep;   Exercises; Slow march x20; hip 3 way x20; squats x20; hip extension x20; hip  abduction x20; Sit to stand x20;  board trunk flexion x10 lateral board rotation x10 each way seated   With noodle neck noddle and foot float; push pull x20; side to side perturbations from the feet x20; press and push x20  Steps step up x20 each leg; lateral step up x20 each leg;   Noodle push and pull x20 with core breathing; noodle press x20 with core breathing  Tandem Stance eyes open and eyes closed   Narrow base eyes open and eyes closed  Hamstring stretch 3x20 sec hold  Piriformis stretch 3x20 sec hold     Pt requires buoyancy for support and to offload joints with strengthening exercises. Viscosity of the water is needed for resistance of strengthening; water current perturbations provides challenge to standing balance unsupported, requiring increased core activation.                     PT Short Term Goals  - 11/06/20 1650       PT SHORT TERM GOAL #1   Title Pt will be independent with HEP    Time 3    Period Weeks    Status New    Target Date 11/27/20      PT SHORT TERM GOAL #2   Title Pt will be able to tolerate walking at least 10 minutes to demo improved endurance    Baseline Reports only able to walk ~5 min in her yard    Time 3    Period Weeks    Status New    Target Date 11/27/20               PT Long Term Goals - 11/06/20 1651       PT LONG TERM GOAL #1   Title Pt will be independent with her final HEP and transition to community program    Time 6    Period Weeks    Status New    Target Date 12/18/20      PT LONG TERM GOAL #2   Title Pt will be able to perform SLS for at least 10 sec R & L LE to demo improved LE stability, strength, and balance    Baseline 4-6 sec only    Time 6    Period Weeks    Status New    Target Date 12/18/20      PT LONG TERM GOAL #3   Title Pt will remain pain free in her back    Time 6    Period Weeks    Status New    Target Date 12/18/20      PT LONG TERM GOAL #4   Title Pt will be able to safely lift at least 20# with proper body mechanics to reduce chances of future back injury    Time 6    Period Weeks    Status New    Target Date 12/18/20      PT LONG TERM GOAL #5   Title Pt will be able to return to ambulating 20 min/day    Time 6    Period Weeks    Status New    Target Date 12/18/20                   Plan - 11/09/20 0859     Clinical Impression Statement Patient tolerated pool therapy well. She reports she was injured side stepping in the past. Therapy focused on exercises that fouced on hip hinging and  flexion. She also perfromed basic core stabilization and hip strengthening. She had no increase in lower back pain with treatment. Therapy will continue to progress as tolerated.    Personal Factors and Comorbidities Age;Time since onset of injury/illness/exacerbation;Past/Current Experience     Examination-Activity Limitations Locomotion Level;Lift;Sleep    Examination-Participation Restrictions Yard Work;Shop;Cleaning;Laundry    Stability/Clinical Decision Making Stable/Uncomplicated    Clinical Decision Making Low    Rehab Potential Good    PT Frequency 2x / week    PT Duration 6 weeks    PT Treatment/Interventions Aquatic Therapy;ADLs/Self Care Home Management;Moist Heat;Electrical Stimulation;Cryotherapy;Gait training;Stair training;Functional mobility training;Therapeutic activities;Therapeutic exercise;Balance training;Neuromuscular re-education;Manual techniques;Patient/family education;Passive range of motion;Dry needling;Taping;Vasopneumatic Device;Spinal Manipulations;Joint Manipulations             Patient will benefit from skilled therapeutic intervention in order to improve the following deficits and impairments:  Decreased endurance, Decreased strength, Decreased mobility, Improper body mechanics, Impaired sensation  Visit Diagnosis: Muscle weakness (generalized)  Other abnormalities of gait and mobility     Problem List Patient Active Problem List   Diagnosis Date Noted   S/P lumbar fusion 04/11/2020   Pre-op evaluation 03/07/2020   Abnormal EKG 03/07/2020   Blood glucose abnormal 03/21/2019   Cardiovascular stress test abnormal 03/21/2019   Cervical disc disorder 03/21/2019   Disc disorder of lumbar region 03/21/2019   Depressive disorder 03/21/2019   Disorder of bone and articular cartilage 03/21/2019   Hyperlipidemia 03/21/2019   Impaired fasting glucose 03/21/2019   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9) 04/12/2017   Preop cardiovascular exam 12/17/2016   Prediabetes 12/17/2016   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 [place], [place], [address] Phone: [phone]   Fax:  [phone]  Name: Jaia Alonge MRN: [id number] Date of Birth: 1945-07-07

## 2020-11-13 ENCOUNTER — Ambulatory Visit (HOSPITAL_BASED_OUTPATIENT_CLINIC_OR_DEPARTMENT_OTHER): Payer: Medicare Other | Admitting: Physical Therapy

## 2020-11-13 ENCOUNTER — Encounter (HOSPITAL_BASED_OUTPATIENT_CLINIC_OR_DEPARTMENT_OTHER): Payer: Self-pay | Admitting: Physical Therapy

## 2020-11-13 ENCOUNTER — Other Ambulatory Visit: Payer: Self-pay

## 2020-11-13 DIAGNOSIS — M6281 Muscle weakness (generalized): Secondary | ICD-10-CM

## 2020-11-13 DIAGNOSIS — R2689 Other abnormalities of gait and mobility: Secondary | ICD-10-CM
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
## 2020-11-19 ENCOUNTER — Ambulatory Visit (HOSPITAL_BASED_OUTPATIENT_CLINIC_OR_DEPARTMENT_OTHER): Payer: Medicare Other | Admitting: Physical Therapy

## 2020-11-21 ENCOUNTER — Ambulatory Visit (HOSPITAL_BASED_OUTPATIENT_CLINIC_OR_DEPARTMENT_OTHER): Payer: Medicare Other | Admitting: Physical Therapy

## 2020-11-26 ENCOUNTER — Ambulatory Visit (HOSPITAL_BASED_OUTPATIENT_CLINIC_OR_DEPARTMENT_OTHER): Payer: Medicare Other | Admitting: Physical Therapy

## 2020-11-28 ENCOUNTER — Other Ambulatory Visit: Payer: Self-pay

## 2020-11-28 ENCOUNTER — Ambulatory Visit (INDEPENDENT_AMBULATORY_CARE_PROVIDER_SITE_OTHER): Payer: Medicare Other | Admitting: Internal Medicine

## 2020-11-28 ENCOUNTER — Encounter: Payer: Self-pay | Admitting: Internal Medicine

## 2020-11-28 ENCOUNTER — Encounter (HOSPITAL_BASED_OUTPATIENT_CLINIC_OR_DEPARTMENT_OTHER): Payer: Self-pay | Admitting: Physical Therapy

## 2020-11-28 ENCOUNTER — Ambulatory Visit (HOSPITAL_BASED_OUTPATIENT_CLINIC_OR_DEPARTMENT_OTHER): Payer: Medicare Other | Attending: Orthopedic Surgery | Admitting: Physical Therapy

## 2020-11-28 VITALS — BP 120/84 | HR 73 | Temp 98.6°F | Ht 66.0 in | Wt 176.4 lb

## 2020-11-28 DIAGNOSIS — F329 Major depressive disorder, single episode, unspecified: Secondary | ICD-10-CM

## 2020-11-28 DIAGNOSIS — Z85820 Personal history of malignant melanoma of skin: Secondary | ICD-10-CM

## 2020-11-28 DIAGNOSIS — Z5181 Encounter for therapeutic drug level monitoring: Secondary | ICD-10-CM

## 2020-11-28 DIAGNOSIS — R2689 Other abnormalities of gait and mobility: Secondary | ICD-10-CM | POA: Insufficient documentation

## 2020-11-28 DIAGNOSIS — M6281 Muscle weakness (generalized): Secondary | ICD-10-CM | POA: Diagnosis not present

## 2020-11-28 DIAGNOSIS — F32A Depression, unspecified: Secondary | ICD-10-CM

## 2020-11-28 DIAGNOSIS — Z79899 Other long term (current) drug therapy: Secondary | ICD-10-CM

## 2020-11-28 DIAGNOSIS — E7849 Other hyperlipidemia: Secondary | ICD-10-CM

## 2020-11-28 DIAGNOSIS — E7800 Pure hypercholesterolemia, unspecified: Secondary | ICD-10-CM | POA: Diagnosis not present

## 2020-11-28 MED ORDER — PAROXETINE HCL 20 MG PO TABS: 20.0000 mg | ORAL_TABLET | Freq: Every day | ORAL | 2 refills | Status: DC

## 2020-11-28 MED ORDER — ATORVASTATIN CALCIUM 10 MG PO TABS
10.0000 mg | ORAL_TABLET | Freq: Every day | ORAL | 1 refills | Status: DC
Start: 2020-11-28 — End: 2021-06-03

## 2020-11-28 MED ORDER — AMOXICILLIN 500 MG PO CAPS: ORAL_CAPSULE | ORAL | 1 refills | Status: DC

## 2020-11-28 NOTE — Progress Notes (Signed)
I,Katawbba Wiggins,acting as a Education administrator for Maximino Greenland, MD.,have documented all relevant documentation on the behalf of Maximino Greenland, MD,as directed by  Maximino Greenland, MD while in the presence of Maximino Greenland, MD.  This visit occurred during the SARS-CoV-2 public health emergency.  Safety protocols were in place, including screening questions prior to the visit, additional usage of staff PPE, and extensive cleaning of exam room while observing appropriate contact time as indicated for disinfecting solutions.  Subjective:     Patient ID: Jenny [name] , female    DOB: 21-Dec-1945 , 75 y.o.   MRN: [id number]   Chief Complaint  Patient presents with   Hyperlipidemia    HPI  Pt is here today for a cholesterol check.  She has been taking atorvastatin without any issues. She denies headaches, chest pain and shortness of breath.   Hyperlipidemia This is a chronic problem. The current episode started more than 1 year ago. The problem is controlled. She has no history of chronic renal disease or hypothyroidism. Pertinent negatives include no chest pain, focal weakness or shortness of breath. Current antihyperlipidemic treatment includes statins. The current treatment provides moderate improvement of lipids. Risk factors for coronary artery disease include dyslipidemia and post-menopausal.    Past Medical History:  Diagnosis Date   Anxiety    Arthritis    generalized   Bursitis of both hips    Cancer ([place]) 2010   Melanoma Left Arm   Cataract 2016,2017   Chronic kidney disease    Depression    Hyperlipidemia    Hypertension    Neuromuscular disorder ([place])    Peripheral Nueropathy ( Left Side)   Plantar fasciitis    PONV (postoperative nausea and vomiting)    2014 (per pt, hard time waking up)   Sleep apnea      Family History  Problem Relation Age of Onset   COPD Mother    Psychosis Mother    COPD Father    Depression Father    Hypertension Father    Lupus Daughter     Depression Paternal Aunt    Heart disease Paternal Grandmother      Current Outpatient Medications:    Alpha-D-Galactosidase (BEANO PO), Take by mouth. prn, Disp: , Rfl:    amoxicillin (AMOXIL) 500 MG capsule, Take 4 caps 1 hour prior to dental procedure, Disp: 8 capsule, Rfl: 1   DENTA 5000 PLUS 1.1 % CREA dental cream, Place 1 application onto teeth at bedtime., Disp: 51 g, Rfl: 3   diphenhydrAMINE (BENADRYL) 25 mg capsule, Take 25 mg by mouth at bedtime as needed for sleep. , Disp: , Rfl:    Lactase (LACTAID PO), Take by mouth. prn, Disp: , Rfl:    PARoxetine (PAXIL) 20 MG tablet, Take 1 tablet (20 mg total) by mouth daily., Disp: 90 tablet, Rfl: 2   atorvastatin (LIPITOR) 10 MG tablet, Take 1 tablet (10 mg total) by mouth daily., Disp: 90 tablet, Rfl: 1   Allergies  Allergen Reactions   Clindamycin Hives, Itching and Rash   Prochlorperazine Palpitations    Rapid heart rate, insomnia, made her "super hyper"   Ibandronic Acid     Lower Jaw Pain   Raloxifene     Lower Jaw Pain   Diclofenac Sodium Other (See Comments)    Turns feces white   Penicillin V Potassium Rash     Review of Systems  Constitutional: Negative.   Respiratory: Negative.  Negative for shortness of breath.  Cardiovascular: Negative.  Negative for chest pain.  Gastrointestinal: Negative.   Neurological: Negative.  Negative for focal weakness.  Psychiatric/Behavioral: Negative.  Negative for dysphoric mood.        States her mood is stable on paroxetine 67m. Feels she is ready to try lower dose.     Today's Vitals   11/28/20 1149  BP: 120/84  Pulse: 73  Temp: 98.6 F (37 C)  TempSrc: Oral  Weight: 176 lb 6.4 oz (80 kg)  Height: _0  (1.676 m)  PainSc: 1   PainLoc: Back   Body mass index is 28.47 kg/m.  Wt Readings from Last 3 Encounters:  11/28/20 176 lb 6.4 oz (80 kg)  05/02/20 178 lb (80.7 kg)  05/02/20 178 lb 12.8 oz (81.1 kg)   BP Readings from Last 3 Encounters:  11/28/20 120/84   05/02/20 120/70  05/02/20 120/70    Objective:  Physical Exam Vitals and nursing note reviewed.  Constitutional:      Appearance: Normal appearance.  HENT:     Head: Normocephalic and atraumatic.     Nose:     Comments: Masked     Mouth/Throat:     Comments: Masked  Eyes:     Extraocular Movements: Extraocular movements intact.  Cardiovascular:     Rate and Rhythm: Normal rate and regular rhythm.     Heart sounds: Normal heart sounds.  Pulmonary:     Effort: Pulmonary effort is normal.     Breath sounds: Normal breath sounds.  Musculoskeletal:     Cervical back: Normal range of motion.  Skin:    General: Skin is warm.  Neurological:     General: No focal deficit present.     Mental Status: She is alert.  Psychiatric:        Mood and Affect: Mood normal.        Behavior: Behavior normal.        Assessment And Plan:     1. Pure hypercholesterolemia Comments: Chronic, I will check CMP, lipid panel today. Encouraged to limit intake of fried foods, increase exercise as tolerated and to increase her fiber intake.  - Lipid panel - CMP14+EGFR  2. Depressive disorder Comments: Chronic, she is ready to decrease paroxetine dose to 244mdaily. She agrees to f/u in six weeks.   3. Personal history of malignant melanoma Comments: I will refer her to Derm for annual full body skin check.  - Ambulatory referral to Dermatology  4. Encounter for monitoring SBE (subacute bacterial endocarditis) prophylaxis Comments: She was given rx amoxicillin 50046m caps 1 hour prior to dental procedure.   5. Drug therapy - CBC no Diff    Patient was given opportunity to ask questions. Patient verbalized understanding of the plan and was able to repeat key elements of the plan. All questions were answered to their satisfaction.   I, RobMaximino GreenlandD, have reviewed all documentation for this visit. The documentation on 12/02/20 for the exam, diagnosis, procedures, and orders are all  accurate and complete.   IF YOU HAVE BEEN REFERRED TO A SPECIALIST, IT MAY TAKE 1-2 WEEKS TO SCHEDULE/PROCESS THE REFERRAL. IF YOU HAVE NOT HEARD FROM US/SPECIALIST IN TWO WEEKS, PLEASE GIVE US [place]CALL AT [phone] X [phone].   THE PATIENT IS ENCOURAGED TO PRACTICE SOCIAL DISTANCING DUE TO THE COVID-19 PANDEMIC.

## 2020-11-29 ENCOUNTER — Encounter (HOSPITAL_BASED_OUTPATIENT_CLINIC_OR_DEPARTMENT_OTHER): Payer: Self-pay | Admitting: Physical Therapy

## 2020-11-29 LAB — CMP14+EGFR
ALT: 14 IU/L (ref 0–32)
AST: 20 IU/L (ref 0–40)
Albumin/Globulin Ratio: 2.3 — ABNORMAL HIGH (ref 1.2–2.2)
Albumin: 4.6 g/dL (ref 3.7–4.7)
Alkaline Phosphatase: 91 IU/L (ref 44–121)
BUN/Creatinine Ratio: 19 (ref 12–28)
BUN: 19 mg/dL (ref 8–27)
Bilirubin Total: 0.3 mg/dL (ref 0.0–1.2)
CO2: 25 mmol/L (ref 20–29)
Calcium: 9.2 mg/dL (ref 8.7–10.3)
Chloride: 110 mmol/L — ABNORMAL HIGH (ref 96–106)
Creatinine, Ser: 0.98 mg/dL (ref 0.57–1.00)
Globulin, Total: 2 g/dL (ref 1.5–4.5)
Glucose: 96 mg/dL (ref 65–99)
Potassium: 4.9 mmol/L (ref 3.5–5.2)
Sodium: 149 mmol/L — ABNORMAL HIGH (ref 134–144)
Total Protein: 6.6 g/dL (ref 6.0–8.5)
eGFR: 61 mL/min/{1.73_m2} (ref >59)

## 2020-11-29 LAB — CBC
Hematocrit: 39.1 % (ref 34.0–46.6)
Hemoglobin: 13 g/dL (ref 11.1–15.9)
MCH: 30.4 pg (ref 26.6–33.0)
MCHC: 33.2 g/dL (ref 31.5–35.7)
MCV: 91 fL (ref 79–97)
Platelets: 240 10*3/uL (ref 150–450)
RBC: 4.28 x10E6/uL (ref 3.77–5.28)
RDW: 13.6 % (ref 11.7–15.4)
WBC: 5.2 10*3/uL (ref 3.4–10.8)

## 2020-11-29 LAB — LIPID PANEL
Chol/HDL Ratio: 2.5 ratio (ref 0.0–4.4)
Cholesterol, Total: 170 mg/dL (ref 100–199)
HDL: 67 mg/dL (ref >39)
LDL Chol Calc (NIH): 84 mg/dL (ref 0–99)
Triglycerides: 105 mg/dL (ref 0–149)
VLDL Cholesterol Cal: 19 mg/dL (ref 5–40)
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
## 2020-12-04 ENCOUNTER — Ambulatory Visit (HOSPITAL_BASED_OUTPATIENT_CLINIC_OR_DEPARTMENT_OTHER): Payer: Medicare Other | Admitting: Physical Therapy

## 2020-12-04 ENCOUNTER — Other Ambulatory Visit: Payer: Self-pay

## 2020-12-04 ENCOUNTER — Encounter (HOSPITAL_BASED_OUTPATIENT_CLINIC_OR_DEPARTMENT_OTHER): Payer: Self-pay | Admitting: Physical Therapy

## 2020-12-04 DIAGNOSIS — M6281 Muscle weakness (generalized): Secondary | ICD-10-CM | POA: Diagnosis not present

## 2020-12-04 DIAGNOSIS — R2689 Other abnormalities of gait and mobility: Secondary | ICD-10-CM

## 2020-12-04 NOTE — Therapy (Signed)
[facility] [place], [place], [address] Phone: [phone]   Fax:  [phone]  Physical Therapy Treatment  Patient Details  Name: Jenny [name] MRN: [id number] Date of Birth: 1945-07-14 Referring Provider (PT): Melina Schools, MD   Encounter Date: 12/04/2020   PT End of Session - 12/04/20 1153     Visit Number 5    Number of Visits 12    Date for PT Re-Evaluation 12/18/20    Authorization Type Medicare    PT Start Time 0901    PT Stop Time 0945    PT Time Calculation (min) 44 min    Equipment Utilized During Treatment Other (comment)   Ankle cuff and waist buoys, kick board, noodle/sqoodle, nekdoodle, weights and barbells   Activity Tolerance Patient tolerated treatment well    Behavior During Therapy WFL for tasks assessed/performed             Past Medical History:  Diagnosis Date   Anxiety    Arthritis    generalized   Bursitis of both hips    Cancer ([facility]) 2010   Melanoma Left Arm   Cataract 2016,2017   Chronic kidney disease    Depression    Hyperlipidemia    Hypertension    Neuromuscular disorder ([facility])    Peripheral Nueropathy ( Left Side)   Plantar fasciitis    PONV (postoperative nausea and vomiting)    2014 (per pt, hard time waking up)   Sleep apnea     Past Surgical History:  Procedure Laterality Date   BACK SURGERY  05/04/2013   Cervical Discectomy with Fusion by [place]   fibroadenoma   CATARACT EXTRACTION Left 06/14/2015   CATARACT EXTRACTION Right 05/08/2015   CATARACT EXTRACTION, BILATERAL  2017   CERVICAL SPINE SURGERY  2014   spinal fusion   MELANOMA EXCISION Left 2010   left arm   TONSILLECTOMY     removed at age 31   TRANSFORAMINAL LUMBAR INTERBODY FUSION (TLIF) WITH PEDICLE SCREW FIXATION 1 LEVEL N/A 04/11/2020   Procedure: TRANSFORAMINAL LUMBAR INTERBODY FUSION (TLIF) L5-S1, L4-5 LEFT LAMINOTOMY/ DECOMPRESSION;  Surgeon: Melina Schools, MD;  Location: [facility];  Service: Orthopedics;  Laterality: N/A;  5 hrs   TUBAL LIGATION      There were no vitals filed for this visit.   Subjective Assessment - 12/04/20 1151     Subjective Pt with no complaints.  Feels aquatic therapy is making her stronger    Currently in Pain? Yes    Pain Score 1     Pain Location Back    Pain Descriptors / Indicators Aching    Pain Type Chronic pain            Pt seen for aquatic therapy today.  Treatment took place in water 3.25-4 ft in depth at the [employer] [facility] pool. Temp of water was 91.  Pt entered/exited the pool via stairs step through pattern independently with bilat rail.    TUG 20 Noodle kick downs x 30 seconds: left:12  right:14  supported bilat triangle hand buoys    Warm up: heel/toe, backward, and side stepping walking x4 laps across pool chest deep side stepping x4 laps from shallow to deep;   Seated Gastroc, hamstring, glute and piriformis stretch 3 x 30 seconds  Vertically suspended LB stretch stabilized by therapist knees to chest with overpressure 3 x 30 seconds Gentle core rotation suspended by noodle Straddling noodle aerobic capacity  challenges bicycling intervals of 30 secs x 4  Standing Noodle kick down 2 x 10 reps hip flex then x 10 external rotation beginning holding to wall progressed to triangle hand buoys. Cueing for abdominal and glute contraction for strengthening and balance control. Hip flexor stretch facing wall using noodle.  Verbal and tactile Cuing for positioning, core stabilization and tech 3 x 30 sec. Hip flex strengthening, noodle pull through 2 x 10 reps   Pt requires buoyancy for support and to offload joints with strengthening exercises. Viscosity of the water is needed for resistance of strengthening; water current perturbations provides challenge to standing balance unsupported, requiring increased core activation.                             PT  Short Term Goals - 11/06/20 1650       PT SHORT TERM GOAL #1   Title Pt will be independent with HEP    Time 3    Period Weeks    Status New    Target Date 11/27/20      PT SHORT TERM GOAL #2   Title Pt will be able to tolerate walking at least 10 minutes to demo improved endurance    Baseline Reports only able to walk ~5 min in her yard    Time 3    Period Weeks    Status New    Target Date 11/27/20               PT Long Term Goals - 11/06/20 1651       PT LONG TERM GOAL #1   Title Pt will be independent with her final HEP and transition to community program    Time 6    Period Weeks    Status New    Target Date 12/18/20      PT LONG TERM GOAL #2   Title Pt will be able to perform SLS for at least 10 sec R & L LE to demo improved LE stability, strength, and balance    Baseline 4-6 sec only    Time 6    Period Weeks    Status New    Target Date 12/18/20      PT LONG TERM GOAL #3   Title Pt will remain pain free in her back    Time 6    Period Weeks    Status New    Target Date 12/18/20      PT LONG TERM GOAL #4   Title Pt will be able to safely lift at least 20# with proper body mechanics to reduce chances of future back injury    Time 6    Period Weeks    Status New    Target Date 12/18/20      PT LONG TERM GOAL #5   Title Pt will be able to return to ambulating 20 min/day    Time 6    Period Weeks    Status New    Target Date 12/18/20                   Plan - 12/04/20 1155     Clinical Impression Statement Pt reporting completing 3 5 min walks daily as instructed for part of HEP.  States 5-6 minutes is her max as she becomes fatigued. PT directs pt today on advancing muscle stretching, core strengthening and balance challenges. Added aerobic capacity challenge as  well.    Personal Factors and Comorbidities Age;Time since onset of injury/illness/exacerbation;Past/Current Experience    Examination-Activity Limitations Locomotion  Level;Lift;Sleep    Examination-Participation Restrictions Yard Work;Shop;Cleaning;Laundry    Stability/Clinical Decision Making Stable/Uncomplicated    Clinical Decision Making Low    Rehab Potential Good    PT Frequency 2x / week    PT Duration 6 weeks    PT Treatment/Interventions Aquatic Therapy;ADLs/Self Care Home Management;Moist Heat;Electrical Stimulation;Cryotherapy;Gait training;Stair training;Functional mobility training;Therapeutic activities;Therapeutic exercise;Balance training;Neuromuscular re-education;Manual techniques;Patient/family education;Passive range of motion;Dry needling;Taping;Vasopneumatic Device;Spinal Manipulations;Joint Manipulations    PT Next Visit Plan soarness after last aquatic session??    PT Home Exercise Plan [place] and Agree with Plan of Care Patient             Patient will benefit from skilled therapeutic intervention in order to improve the following deficits and impairments:  Decreased endurance, Decreased strength, Decreased mobility, Improper body mechanics, Impaired sensation  Visit Diagnosis: Muscle weakness (generalized)  Other abnormalities of gait and mobility     Problem List Patient Active Problem List   Diagnosis Date Noted   S/P lumbar fusion 04/11/2020   Pre-op evaluation 03/07/2020   Abnormal EKG 03/07/2020   Blood glucose abnormal 03/21/2019   Cardiovascular stress test abnormal 03/21/2019   Cervical disc disorder 03/21/2019   Disc disorder of lumbar region 03/21/2019   Depressive disorder 03/21/2019   Disorder of bone and articular cartilage 03/21/2019   Hyperlipidemia 03/21/2019   Impaired fasting glucose 03/21/2019   Mammogram abnormal 03/21/2019   Numbness of lower limb 03/21/2019   Osteopenia 03/21/2019   Seborrheic keratosis 03/21/2019   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   Varicose veins of lower extremity 03/21/2019   Vitamin D deficiency 03/21/2019   Spinal stenosis of lumbar  region 01/20/2019   Bursitis of left hip 01/03/2019   Peripheral neuropathic pain 01/03/2019   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   Need for immunization against influenza 03/16/2018   Proteinuria 09/29/2017   Chronic kidney disease, stage 2 (mild) 09/29/2017   Overweight (BMI 25.0-29.9) 04/12/2017   Preop cardiovascular exam 12/17/2016   Prediabetes 12/17/2016   Stage 3 chronic kidney disease ([facility]) 12/03/2016   Obstructive sleep apnea syndrome 04/08/2015    Vedia Pereyra  MPT 12/04/2020, 12:13 PM  [facility] [address] [place], [place], [address] Phone: [phone]   Fax:  [phone]  Name: Jenny [name] MRN: [id number] Date of Birth: February 03, 1946

## 2020-12-06 ENCOUNTER — Other Ambulatory Visit: Payer: Self-pay

## 2020-12-06 ENCOUNTER — Encounter (HOSPITAL_BASED_OUTPATIENT_CLINIC_OR_DEPARTMENT_OTHER): Payer: Self-pay | Admitting: Physical Therapy

## 2020-12-06 ENCOUNTER — Ambulatory Visit (HOSPITAL_BASED_OUTPATIENT_CLINIC_OR_DEPARTMENT_OTHER): Payer: Medicare Other | Admitting: Physical Therapy

## 2020-12-06 DIAGNOSIS — R2689 Other abnormalities of gait and mobility: Secondary | ICD-10-CM

## 2020-12-06 DIAGNOSIS — M6281 Muscle weakness (generalized): Secondary | ICD-10-CM

## 2020-12-06 NOTE — Therapy (Signed)
[name], [place], [address] Phone: [phone]   Fax:  [phone]  Physical Therapy Treatment  Patient Details  Name: Jenny [name] MRN: [id number] Date of Birth: 28-Aug-1945 Referring Provider (PT): Melina Schools, MD   Encounter Date: 12/06/2020  Subjective My knees hurt for a few days after last visit.  But was able to climb in and out of lake better.  Pain: 1/10 lower back aching, chronic Past Medical History:  Diagnosis Date   Anxiety    Arthritis    generalized   Bursitis of both hips    Cancer ([facility]) 2010   Melanoma Left Arm   Cataract 2016,2017   Chronic kidney disease    Depression    Hyperlipidemia    Hypertension    Neuromuscular disorder ([facility])    Peripheral Nueropathy ( Left Side)   Plantar fasciitis    PONV (postoperative nausea and vomiting)    2014 (per pt, hard time waking up)   Sleep apnea     Past Surgical History:  Procedure Laterality Date   BACK SURGERY  05/04/2013   Cervical Discectomy with Fusion by [place]   fibroadenoma   CATARACT EXTRACTION Left 06/14/2015   CATARACT EXTRACTION Right 05/08/2015   CATARACT EXTRACTION, BILATERAL  2017   CERVICAL SPINE SURGERY  2014   spinal fusion   MELANOMA EXCISION Left 2010   left arm   TONSILLECTOMY     removed at age 24   TRANSFORAMINAL LUMBAR INTERBODY FUSION (TLIF) WITH PEDICLE SCREW FIXATION 1 LEVEL N/A 04/11/2020   Procedure: TRANSFORAMINAL LUMBAR INTERBODY FUSION (TLIF) L5-S1, L4-5 LEFT LAMINOTOMY/ DECOMPRESSION;  Surgeon: Melina Schools, MD;  Location: [facility];  Service: Orthopedics;  Laterality: N/A;  5 hrs   TUBAL LIGATION      There were no vitals filed for this visit.    TUG 20 Noodle kick downs x 30 seconds: left:12  right:14  supported bilat triangle hand buoys     Warm up: heel/toe, backward, and side stepping walking x4 laps across pool chest deep side stepping x4 laps  from shallow to deep;    Seated Gastroc, hamstring, glute and piriformis stretch 3 x 30 seconds  LB stretch knees to chest with manual assist by therapist for positioning  Vertically suspended Gentle core rotation suspended by noodle   Standing Noodle kick down 2 x 10 reps hip flex then x 10 external rotation beginning holding to triangle hand buoys. Cueing for abdominal and glute contraction for strengthening and balance control, manual perturbations as per PT Hip flexor stretch facing wall using noodle.  Verbal and tactile Cuing for positioning, core stabilization and tech 3 x 30 sec. Hip flex strengthening, noodle pull through 2 x 10 reps Add/abd, hip flex, hip circles 2 x10 rep Kick board push downs 2 x 10 reps wide BOS. Cuing for tight core. Step ups on bottom step leading with each LE x 10 reps bilat ue support.   Pt requires buoyancy for support and to offload joints with strengthening exercises. Viscosity of the water is needed for resistance of strengthening; water current perturbations provides challenge to standing balance unsupported, requiring increased core activation.                             PT Short Term Goals - 11/06/20 1650       PT SHORT TERM GOAL #1   Title Pt  will be independent with HEP    Time 3    Period Weeks    Status New    Target Date 11/27/20      PT SHORT TERM GOAL #2   Title Pt will be able to tolerate walking at least 10 minutes to demo improved endurance    Baseline Reports only able to walk ~5 min in her yard    Time 3    Period Weeks    Status New    Target Date 11/27/20               PT Long Term Goals - 11/06/20 1651       PT LONG TERM GOAL #1   Title Pt will be independent with her final HEP and transition to community program    Time 6    Period Weeks    Status New    Target Date 12/18/20      PT LONG TERM GOAL #2   Title Pt will be able to perform SLS for at least 10 sec R & L LE to demo  improved LE stability, strength, and balance    Baseline 4-6 sec only    Time 6    Period Weeks    Status New    Target Date 12/18/20      PT LONG TERM GOAL #3   Title Pt will remain pain free in her back    Time 6    Period Weeks    Status New    Target Date 12/18/20      PT LONG TERM GOAL #4   Title Pt will be able to safely lift at least 20# with proper body mechanics to reduce chances of future back injury    Time 6    Period Weeks    Status New    Target Date 12/18/20      PT LONG TERM GOAL #5   Title Pt will be able to return to ambulating 20 min/day    Time 6    Period Weeks    Status New    Target Date 12/18/20            Clinical impression statement Reports of some increase inknee pain after last visit. WE modified activities today elimitating the intervals which may have been culprit.  Will add back in if able.  She completed, as directed all exercises without increase in discomfort. Cueing needed for posture and tight core throughout.       Patient will benefit from skilled therapeutic intervention in order to improve the following deficits and impairments:     Visit Diagnosis: Muscle weakness (generalized)  Other abnormalities of gait and mobility     Problem List Patient Active Problem List   Diagnosis Date Noted   S/P lumbar fusion 04/11/2020   Pre-op evaluation 03/07/2020   Abnormal EKG 03/07/2020   Blood glucose abnormal 03/21/2019   Cardiovascular stress test abnormal 03/21/2019   Cervical disc disorder 03/21/2019   Disc disorder of lumbar region 03/21/2019   Depressive disorder 03/21/2019   Disorder of bone and articular cartilage 03/21/2019   Hyperlipidemia 03/21/2019   Impaired fasting glucose 03/21/2019   Mammogram abnormal 03/21/2019   Numbness of lower limb 03/21/2019   Osteopenia 03/21/2019   Seborrheic keratosis 03/21/2019   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   Varicose veins of lower extremity 03/21/2019    Vitamin D deficiency 03/21/2019   Spinal stenosis of lumbar region 01/20/2019   Bursitis of  left hip 01/03/2019   Peripheral neuropathic pain 01/03/2019   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   Need for immunization against influenza 03/16/2018   Proteinuria 09/29/2017   Chronic kidney disease, stage 2 (mild) 09/29/2017   Overweight (BMI 25.0-29.9) 04/12/2017   Preop cardiovascular exam 12/17/2016   Prediabetes 12/17/2016   Stage 3 chronic kidney disease ([facility]) 12/03/2016   Obstructive sleep apnea syndrome 04/08/2015    Vedia Pereyra  MPT 12/09/2020, 1:52 PM  [facility] [address] [place], [place], [address] Phone: [phone]   Fax:  [phone]  Name: Shandiin Eisenbeis MRN: [id number] Date of Birth: 12-06-1945

## 2020-12-11 ENCOUNTER — Other Ambulatory Visit: Payer: Self-pay

## 2020-12-11 ENCOUNTER — Encounter (HOSPITAL_BASED_OUTPATIENT_CLINIC_OR_DEPARTMENT_OTHER): Payer: Self-pay | Admitting: Physical Therapy

## 2020-12-11 ENCOUNTER — Ambulatory Visit (HOSPITAL_BASED_OUTPATIENT_CLINIC_OR_DEPARTMENT_OTHER): Payer: Medicare Other | Admitting: Physical Therapy

## 2020-12-11 DIAGNOSIS — M6281 Muscle weakness (generalized): Secondary | ICD-10-CM

## 2020-12-11 DIAGNOSIS — R2689 Other abnormalities of gait and mobility: Secondary | ICD-10-CM

## 2020-12-11 NOTE — Therapy (Signed)
[facility] [place], [place], [address] Phone: [phone]   Fax:  [phone]  Physical Therapy Treatment  Patient Details  Name: Jenny [name] MRN: [id number] Date of Birth: Mar 28, 1946 Referring Provider (PT): Melina Schools, MD   Encounter Date: 12/11/2020   PT End of Session - 12/11/20 0949     Visit Number 7    Number of Visits 12    Date for PT Re-Evaluation 12/18/20    Authorization Type Medicare    PT Start Time 0946    PT Stop Time 1030    PT Time Calculation (min) 44 min    Equipment Utilized During Treatment Other (comment)    Activity Tolerance Patient tolerated treatment well    Behavior During Therapy [facility] for tasks assessed/performed             Past Medical History:  Diagnosis Date   Anxiety    Arthritis    generalized   Bursitis of both hips    Cancer ([facility]) 2010   Melanoma Left Arm   Cataract 2016,2017   Chronic kidney disease    Depression    Hyperlipidemia    Hypertension    Neuromuscular disorder ([facility])    Peripheral Nueropathy ( Left Side)   Plantar fasciitis    PONV (postoperative nausea and vomiting)    2014 (per pt, hard time waking up)   Sleep apnea     Past Surgical History:  Procedure Laterality Date   BACK SURGERY  05/04/2013   Cervical Discectomy with Fusion by [place]   fibroadenoma   CATARACT EXTRACTION Left 06/14/2015   CATARACT EXTRACTION Right 05/08/2015   CATARACT EXTRACTION, BILATERAL  2017   CERVICAL SPINE SURGERY  2014   spinal fusion   MELANOMA EXCISION Left 2010   left arm   TONSILLECTOMY     removed at age 39   TRANSFORAMINAL LUMBAR INTERBODY FUSION (TLIF) WITH PEDICLE SCREW FIXATION 1 LEVEL N/A 04/11/2020   Procedure: TRANSFORAMINAL LUMBAR INTERBODY FUSION (TLIF) L5-S1, L4-5 LEFT LAMINOTOMY/ DECOMPRESSION;  Surgeon: Melina Schools, MD;  Location: [facility];  Service: Orthopedics;  Laterality: N/A;  5 hrs   TUBAL  LIGATION      There were no vitals filed for this visit.   Subjective Assessment - 12/11/20 1320     Subjective "Doing well."                                         PT Short Term Goals - 12/11/20 1321       PT SHORT TERM GOAL #1   Status Achieved               PT Long Term Goals - 11/06/20 1651       PT LONG TERM GOAL #1   Title Pt will be independent with her final HEP and transition to community program    Time 6    Period Weeks    Status New    Target Date 12/18/20      PT LONG TERM GOAL #2   Title Pt will be able to perform SLS for at least 10 sec R & L LE to demo improved LE stability, strength, and balance    Baseline 4-6 sec only    Time 6    Period Weeks    Status New    Target  Date 12/18/20      PT LONG TERM GOAL #3   Title Pt will remain pain free in her back    Time 6    Period Weeks    Status New    Target Date 12/18/20      PT LONG TERM GOAL #4   Title Pt will be able to safely lift at least 20# with proper body mechanics to reduce chances of future back injury    Time 6    Period Weeks    Status New    Target Date 12/18/20      PT LONG TERM GOAL #5   Title Pt will be able to return to ambulating 20 min/day    Time 6    Period Weeks    Status New    Target Date 12/18/20             Warm up: heel/toe, backward, and side stepping walking x4 laps across pool chest deep side stepping x4 laps from shallow to deep;    Seated Gastroc, hamstring, glute and piriformis stretch 3 x 30 seconds  LB stretch knees to chest with manual assist by therapist for positioning   Vertically suspended Gentle core rotation suspended by noodle   Standing Noodle kick down 2 x 15 reps hip flex then x 15 external rotation holding to 1 triangle hand buoy. Cueing for abdominal and glute contraction for strengthening and balance control, manual perturbations as per PT Hip flexor stretch facing wall using ankle cuff.  Verbal  Cuing and demonstration for positioning. Hip flex strengthening, knee pull through 3x 12 reps Add/abd, hip flex, hip circles 2 x10 rep with ankle buoy Kick board push downs 2 x 10 reps wide BOS. Cuing for tight core. Step ups on bottom step leading with each LE x 10 reps bilat ue support. Aerobic capacity challenges: water walking with increased speed and step length x 6 widths of pool   Pt requires buoyancy for support and to offload joints with strengthening exercises. Viscosity of the water is needed for resistance of strengthening; water current perturbations provides challenge to standing balance unsupported, requiring increased core activation.         Plan - 12/11/20 1028     Clinical Impression Statement Added advanced balance challenge as pt progressing past SLS pushing down on noodle unsupported with added focus on proprioception and kinesthetic senses.  Pt gaining improved core strength/balance ability.  Pt directed in hip extensor and aflex stretching and strengthening to be completed at home. Some right LB fatigue with sesison today.    Personal Factors and Comorbidities Age;Time since onset of injury/illness/exacerbation;Past/Current Experience    Examination-Participation Restrictions Yard Work;Shop;Cleaning;Laundry    Stability/Clinical Decision Making Stable/Uncomplicated    Clinical Decision Making Low    Rehab Potential Good    PT Frequency 2x / week    PT Duration 6 weeks    PT Treatment/Interventions Aquatic Therapy;ADLs/Self Care Home Management;Moist Heat;Electrical Stimulation;Cryotherapy;Gait training;Stair training;Functional mobility training;Therapeutic activities;Therapeutic exercise;Balance training;Neuromuscular re-education;Manual techniques;Patient/family education;Passive range of motion;Dry needling;Taping;Vasopneumatic Device;Spinal Manipulations;Joint Manipulations             Patient will benefit from skilled therapeutic intervention in order to  improve the following deficits and impairments:  Decreased endurance, Decreased strength, Decreased mobility, Improper body mechanics, Impaired sensation  Visit Diagnosis: Muscle weakness (generalized)  Other abnormalities of gait and mobility     Problem List Patient Active Problem List   Diagnosis Date Noted   S/P lumbar fusion  04/11/2020   Pre-op evaluation 03/07/2020   Abnormal EKG 03/07/2020   Blood glucose abnormal 03/21/2019   Cardiovascular stress test abnormal 03/21/2019   Cervical disc disorder 03/21/2019   Disc disorder of lumbar region 03/21/2019   Depressive disorder 03/21/2019   Disorder of bone and articular cartilage 03/21/2019   Hyperlipidemia 03/21/2019   Impaired fasting glucose 03/21/2019   Mammogram abnormal 03/21/2019   Numbness of lower limb 03/21/2019   Osteopenia 03/21/2019   Seborrheic keratosis 03/21/2019   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   Varicose veins of lower extremity 03/21/2019   Vitamin D deficiency 03/21/2019   Spinal stenosis of lumbar region 01/20/2019   Bursitis of left hip 01/03/2019   Peripheral neuropathic pain 01/03/2019   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   Need for immunization against influenza 03/16/2018   Proteinuria 09/29/2017   Chronic kidney disease, stage 2 (mild) 09/29/2017   Overweight (BMI 25.0-29.9) 04/12/2017   Preop cardiovascular exam 12/17/2016   Prediabetes 12/17/2016   Stage 3 chronic kidney disease ([facility]) 12/03/2016   Obstructive sleep apnea syndrome 04/08/2015    Vedia Pereyra MPT  12/11/2020, 1:22 PM  [facility] [address] [place], [place], [address] Phone: [phone]   Fax:  [phone]  Name: Jenny [name] MRN: [id number] Date of Birth: 10/22/1945

## 2020-12-12 ENCOUNTER — Other Ambulatory Visit: Payer: Medicare Other

## 2020-12-12 DIAGNOSIS — E870 Hyperosmolality and hypernatremia: Secondary | ICD-10-CM

## 2020-12-13 ENCOUNTER — Ambulatory Visit (HOSPITAL_BASED_OUTPATIENT_CLINIC_OR_DEPARTMENT_OTHER): Payer: Medicare Other | Admitting: Physical Therapy

## 2020-12-13 ENCOUNTER — Encounter (HOSPITAL_BASED_OUTPATIENT_CLINIC_OR_DEPARTMENT_OTHER): Payer: Self-pay | Admitting: Physical Therapy

## 2020-12-13 ENCOUNTER — Other Ambulatory Visit: Payer: Self-pay

## 2020-12-13 DIAGNOSIS — M6281 Muscle weakness (generalized): Secondary | ICD-10-CM | POA: Diagnosis not present

## 2020-12-13 DIAGNOSIS — R2689 Other abnormalities of gait and mobility: Secondary | ICD-10-CM

## 2020-12-13 LAB — BMP8+EGFR
BUN/Creatinine Ratio: 24 (ref 12–28)
BUN: 22 mg/dL (ref 8–27)
CO2: 24 mmol/L (ref 20–29)
Calcium: 9.3 mg/dL (ref 8.7–10.3)
Chloride: 105 mmol/L (ref 96–106)
Creatinine, Ser: 0.91 mg/dL (ref 0.57–1.00)
Glucose: 98 mg/dL (ref 65–99)
Potassium: 5.2 mmol/L (ref 3.5–5.2)
Sodium: 141 mmol/L (ref 134–144)
eGFR: 66 mL/min/{1.73_m2} (ref >59)

## 2020-12-13 NOTE — Therapy (Signed)
[place], [place], [address] Phone: [phone]   Fax:  [phone]  Physical Therapy Treatment  Patient Details  Name: Jenny [name] MRN: [id number] Date of Birth: 1946-03-24 Referring Provider (PT): Melina Schools, MD   Encounter Date: 12/13/2020   PT End of Session - 12/13/20 1308     Visit Number 8    Number of Visits 12    Date for PT Re-Evaluation 12/18/20    Authorization Type Medicare    PT Start Time 1205    PT Stop Time 1245    PT Time Calculation (min) 40 min    Equipment Utilized During Treatment Other (comment)    Activity Tolerance Patient tolerated treatment well             Past Medical History:  Diagnosis Date   Anxiety    Arthritis    generalized   Bursitis of both hips    Cancer ([place]) 2010   Melanoma Left Arm   Cataract 2016,2017   Chronic kidney disease    Depression    Hyperlipidemia    Hypertension    Neuromuscular disorder ([place])    Peripheral Nueropathy ( Left Side)   Plantar fasciitis    PONV (postoperative nausea and vomiting)    2014 (per pt, hard time waking up)   Sleep apnea     Past Surgical History:  Procedure Laterality Date   BACK SURGERY  05/04/2013   Cervical Discectomy with Fusion by [address]   fibroadenoma   CATARACT EXTRACTION Left 06/14/2015   CATARACT EXTRACTION Right 05/08/2015   CATARACT EXTRACTION, BILATERAL  2017   CERVICAL SPINE SURGERY  2014   spinal fusion   MELANOMA EXCISION Left 2010   left arm   TONSILLECTOMY     removed at age 74   TRANSFORAMINAL LUMBAR INTERBODY FUSION (TLIF) WITH PEDICLE SCREW FIXATION 1 LEVEL N/A 04/11/2020   Procedure: TRANSFORAMINAL LUMBAR INTERBODY FUSION (TLIF) L5-S1, L4-5 LEFT LAMINOTOMY/ DECOMPRESSION;  Surgeon: Melina Schools, MD;  Location: [facility];  Service: Orthopedics;  Laterality: N/A;  5 hrs   TUBAL LIGATION      There were no vitals filed for this visit.    Subjective Assessment - 12/13/20 1215     Subjective " felt good after last treatment ... tired "Closed all my rings on my new apple wach"    Currently in Pain? Yes    Pain Score 0-No pain                                       PT Education - 12/13/20 1232     Comprehension Verbalized understanding              PT Short Term Goals - 12/11/20 1321       PT SHORT TERM GOAL #1   Status Achieved               PT Long Term Goals - 11/06/20 1651       PT LONG TERM GOAL #1   Title Pt will be independent with her final HEP and transition to community program    Time 6    Period Weeks    Status New    Target Date 12/18/20      PT LONG TERM GOAL #2   Title Pt will be able to perform SLS for  at least 10 sec R & L LE to demo improved LE stability, strength, and balance    Baseline 4-6 sec only    Time 6    Period Weeks    Status New    Target Date 12/18/20      PT LONG TERM GOAL #3   Title Pt will remain pain free in her back    Time 6    Period Weeks    Status New    Target Date 12/18/20      PT LONG TERM GOAL #4   Title Pt will be able to safely lift at least 20# with proper body mechanics to reduce chances of future back injury    Time 6    Period Weeks    Status New    Target Date 12/18/20      PT LONG TERM GOAL #5   Title Pt will be able to return to ambulating 20 min/day    Time 6    Period Weeks    Status New    Target Date 12/18/20                   Plan - 12/13/20 1315     Clinical Impression Statement Focus on strengthening, balance and aerobic capacity challenges.  Able to progress reps and duration of activities as evidence of improvment in all areas with also decrease of pain to 0.  Pt has 1 more aquatic session then on land with possible DC.    Personal Factors and Comorbidities Age;Time since onset of injury/illness/exacerbation;Past/Current Experience    Examination-Activity Limitations Locomotion  Level;Lift;Sleep    Stability/Clinical Decision Making Stable/Uncomplicated    Clinical Decision Making Low    Rehab Potential Good    PT Frequency 2x / week    PT Treatment/Interventions Aquatic Therapy;ADLs/Self Care Home Management;Moist Heat;Electrical Stimulation;Cryotherapy;Gait training;Stair training;Functional mobility training;Therapeutic activities;Therapeutic exercise;Balance training;Neuromuscular re-education;Manual techniques;Patient/family education;Passive range of motion;Dry needling;Taping;Vasopneumatic Device;Spinal Manipulations;Joint Manipulations    PT Home Exercise Plan Ensure indep with aquatic HEP, laminate program.    Consulted and Agree with Plan of Care Patient             Patient will benefit from skilled therapeutic intervention in order to improve the following deficits and impairments:  Decreased endurance, Decreased strength, Decreased mobility, Improper body mechanics, Impaired sensation  Visit Diagnosis: Muscle weakness (generalized)  Other abnormalities of gait and mobility     Problem List Patient Active Problem List   Diagnosis Date Noted   S/P lumbar fusion 04/11/2020   Pre-op evaluation 03/07/2020   Abnormal EKG 03/07/2020   Blood glucose abnormal 03/21/2019   Cardiovascular stress test abnormal 03/21/2019   Cervical disc disorder 03/21/2019   Disc disorder of lumbar region 03/21/2019   Depressive disorder 03/21/2019   Disorder of bone and articular cartilage 03/21/2019   Hyperlipidemia 03/21/2019   Impaired fasting glucose 03/21/2019   Mammogram abnormal 03/21/2019   Numbness of lower limb 03/21/2019   Osteopenia 03/21/2019   Seborrheic keratosis 03/21/2019   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   Varicose veins of lower extremity 03/21/2019   Vitamin D deficiency 03/21/2019   Spinal stenosis of lumbar region 01/20/2019   Bursitis of left hip 01/03/2019   Peripheral neuropathic pain 01/03/2019   Degeneration of lumbar  intervertebral disc 07/24/2018   Need for immunization against influenza 03/16/2018   Proteinuria 09/29/2017   Chronic kidney disease, stage 2 (mild) 09/29/2017   Overweight (BMI 25.0-29.9) 04/12/2017   Preop cardiovascular exam 12/17/2016  Prediabetes 12/17/2016   Stage 3 chronic kidney disease ([place]) 12/03/2016   Obstructive sleep apnea syndrome 04/08/2015  Warm up: heel/toe, backward, and side stepping walking x4 laps across pool chest deep side stepping x4 laps from shallow to deep;    Seated Gastroc, hamstring, glute and piriformis stretch 3 x 30 seconds  LB stretch knees to chest with manual assist by therapist for positioning   Standing Noodle kick down 2 x 15 reps hip flex then x 15 external rotation holding to 1 triangle hand buoy. Cueing for abdominal and glute contraction for strengthening and balance control, manual perturbations as per PT Hip flexor stretch facing wall using ankle cuff.  Verbal Cuing and demonstration for positioning. Hip flex strengthening, knee pull through 3x 12 reps Add/abd, hip ex, marching, hip circles 2 x12 rep with ankle buoy Kick board push downs 2 x 10 reps wide BOS. Cuing for tight core. Aerobic capacity challenges: straddling squoodle, bicyclicing  intervals x 5 min, 15 sec fast, 30 sec slow.  2 widths of pool water walking multiple after every 1-2 exercises.   Pt requires buoyancy for support and to offload joints with strengthening exercises. Viscosity of the water is needed for resistance of strengthening; water current perturbations provides challenge to standing balance unsupported, requiring increased core activation.   Jenny [name]  MPT 12/13/2020, 1:19 PM  [facility] [address] [place], [place], [address] Phone: [phone]   Fax:  [phone]  Name: Aroush Chasse MRN: [id number] Date of Birth: [birth year]

## 2020-12-18 ENCOUNTER — Other Ambulatory Visit: Payer: Self-pay

## 2020-12-18 ENCOUNTER — Encounter (HOSPITAL_BASED_OUTPATIENT_CLINIC_OR_DEPARTMENT_OTHER): Payer: Self-pay | Admitting: Physical Therapy

## 2020-12-18 ENCOUNTER — Ambulatory Visit (HOSPITAL_BASED_OUTPATIENT_CLINIC_OR_DEPARTMENT_OTHER): Payer: Medicare Other | Admitting: Physical Therapy

## 2020-12-18 DIAGNOSIS — M6281 Muscle weakness (generalized): Secondary | ICD-10-CM | POA: Diagnosis not present

## 2020-12-18 DIAGNOSIS — R2689 Other abnormalities of gait and mobility: Secondary | ICD-10-CM

## 2020-12-18 NOTE — Therapy (Signed)
[place], [place], [address] Phone: [phone]   Fax:  [phone]  Physical Therapy Treatment  Patient Details  Name: Jenny [name] MRN: [id number] Date of Birth: [birth year] Referring Provider (PT): Melina Schools, MD   Encounter Date: 12/18/2020   PT End of Session - 12/18/20 1026     Visit Number 9    Number of Visits 12    Date for PT Re-Evaluation 12/18/20    PT Start Time 0900    PT Stop Time 0945    PT Time Calculation (min) 45 min    Equipment Utilized During Treatment Other (comment)    Activity Tolerance Patient tolerated treatment well    Behavior During Therapy [facility] for tasks assessed/performed             Past Medical History:  Diagnosis Date   Anxiety    Arthritis    generalized   Bursitis of both hips    Cancer ([place]) 2010   Melanoma Left Arm   Cataract 2016,2017   Chronic kidney disease    Depression    Hyperlipidemia    Hypertension    Neuromuscular disorder ([place])    Peripheral Nueropathy ( Left Side)   Plantar fasciitis    PONV (postoperative nausea and vomiting)    2014 (per pt, hard time waking up)   Sleep apnea     Past Surgical History:  Procedure Laterality Date   BACK SURGERY  05/04/2013   Cervical Discectomy with Fusion by [place]   fibroadenoma   CATARACT EXTRACTION Left 06/14/2015   CATARACT EXTRACTION Right 05/08/2015   CATARACT EXTRACTION, BILATERAL  2017   CERVICAL SPINE SURGERY  2014   spinal fusion   MELANOMA EXCISION Left 2010   left arm   TONSILLECTOMY     removed at age 62   TRANSFORAMINAL LUMBAR INTERBODY FUSION (TLIF) WITH PEDICLE SCREW FIXATION 1 LEVEL N/A 04/11/2020   Procedure: TRANSFORAMINAL LUMBAR INTERBODY FUSION (TLIF) L5-S1, L4-5 LEFT LAMINOTOMY/ DECOMPRESSION;  Surgeon: Melina Schools, MD;  Location: [facility];  Service: Orthopedics;  Laterality: N/A;  5 hrs   TUBAL LIGATION      There were no vitals  filed for this visit.   Subjective Assessment - 12/18/20 0911     Subjective HAd a very busy day yesterday and am very proud of myself.  Was able to sit on my ottoman and wax all of my lower kitchen cabinets.  I took breaks and it took me all day but I finished it and was not eally hurting.  Stretched like i have been shown and am doing well today.                                       PT Education - 12/18/20 0925     Education Details technique for floor recovery    Person(s) Educated Patient    Methods Explanation    Comprehension Verbalized understanding              PT Short Term Goals - 12/11/20 1321       PT SHORT TERM GOAL #1   Status Achieved               PT Long Term Goals - 12/18/20 1029       PT LONG TERM GOAL #1   Status Achieved  Warm up: heel/toe, backward, and side stepping walking x4 laps across pool chest deep side stepping x4 laps from shallow to deep;    Seated Gastroc, hamstring, glute and piriformis stretch 3 x 30 seconds  LB stretch knees to chest with manual assist by therapist for positioning   Standing Noodle kick down 2 x 15 reps hip flex then x 15 external rotation holding to 1 triangle hand buoy. Cueing for abdominal and glute contraction for strengthening and balance control, manual perturbations as per PT Hip flexor stretch facing wall using ankle cuff.  Verbal Cuing and demonstration for positioning. Hip flex strengthening, knee pull through 3x 12 reps Add/abd, hip ex, marching, hip circles 2 x12 rep with ankle buoy Kick board push downs 2 x 10 reps wide BOS. Cuing for tight core.  Reviewed all exercises to ensure understanding and indep with HEP.    2 widths of pool water walking multiple after every 1-2 exercises.   Pt requires buoyancy for support and to offload joints with strengthening exercises. Viscosity of the water is needed for resistance of strengthening; water current  perturbations provides challenge to standing balance unsupported, requiring increased core activation        Plan - 12/18/20 0913     Clinical Impression Statement LAst aquatic session.  Pt is ready for DC from aquatic standpoint.  She is reinstructed and demonstrates indep with HEP.  Laminated copy made.  Pt without c/o discomfort today.  Staes she hs regained functional mobility.    Personal Factors and Comorbidities Age;Time since onset of injury/illness/exacerbation;Past/Current Experience    Examination-Activity Limitations Locomotion Level;Lift;Sleep    PT Treatment/Interventions Aquatic Therapy;ADLs/Self Care Home Management;Moist Heat;Electrical Stimulation;Cryotherapy;Gait training;Stair training;Functional mobility training;Therapeutic activities;Therapeutic exercise;Balance training;Neuromuscular re-education;Manual techniques;Patient/family education;Passive range of motion;Dry needling;Taping;Vasopneumatic Device;Spinal Manipulations;Joint Manipulations             Patient will benefit from skilled therapeutic intervention in order to improve the following deficits and impairments:     Visit Diagnosis: Muscle weakness (generalized)  Other abnormalities of gait and mobility     Problem List Patient Active Problem List   Diagnosis Date Noted   S/P lumbar fusion 04/11/2020   Pre-op evaluation 03/07/2020   Abnormal EKG 03/07/2020   Blood glucose abnormal 03/21/2019   Cardiovascular stress test abnormal 03/21/2019   Cervical disc disorder 03/21/2019   Disc disorder of lumbar region 03/21/2019   Depressive disorder 03/21/2019   Disorder of bone and articular cartilage 03/21/2019   Hyperlipidemia 03/21/2019   Impaired fasting glucose 03/21/2019   Mammogram abnormal 03/21/2019   Numbness of lower limb 03/21/2019   Osteopenia 03/21/2019   Seborrheic keratosis 03/21/2019   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   Varicose veins of lower extremity 03/21/2019    Vitamin D deficiency 03/21/2019   Spinal stenosis of lumbar region 01/20/2019   Bursitis of left hip 01/03/2019   Peripheral neuropathic pain 01/03/2019   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   Need for immunization against influenza 03/16/2018   Proteinuria 09/29/2017   Chronic kidney disease, stage 2 (mild) 09/29/2017   Overweight (BMI 25.0-29.9) 04/12/2017   Preop cardiovascular exam 12/17/2016   Prediabetes 12/17/2016   Stage 3 chronic kidney disease ([place]) 12/03/2016   Obstructive sleep apnea syndrome 04/08/2015    Vedia Pereyra MPT 12/18/2020, 10:35 AM  [facility] [address] [place], [place], [address] Phone: [phone]   Fax:  [phone]  Name: Jenny [name] MRN: [id number] Date of Birth: 1945-08-01

## 2020-12-20 ENCOUNTER — Ambulatory Visit (HOSPITAL_BASED_OUTPATIENT_CLINIC_OR_DEPARTMENT_OTHER): Payer: Medicare Other | Admitting: Physical Therapy

## 2020-12-20 ENCOUNTER — Encounter (HOSPITAL_BASED_OUTPATIENT_CLINIC_OR_DEPARTMENT_OTHER): Payer: Self-pay | Admitting: Physical Therapy

## 2020-12-20 ENCOUNTER — Other Ambulatory Visit: Payer: Self-pay

## 2020-12-20 DIAGNOSIS — M6281 Muscle weakness (generalized): Secondary | ICD-10-CM | POA: Diagnosis not present

## 2020-12-20 DIAGNOSIS — R2689 Other abnormalities of gait and mobility: Secondary | ICD-10-CM

## 2020-12-20 NOTE — Therapy (Signed)
[place], [place], [address] Phone: [phone]   Fax:  [phone]  Physical Therapy Treatment/Discharge   Patient Details  Name: Jenny [name] MRN: [id number] Date of Birth: Nov 12, 1945 Referring Provider (PT): Melina Schools, MD   Encounter Date: 12/20/2020   PT End of Session - 12/20/20 1338     Visit Number 10    Number of Visits 12    Date for PT Re-Evaluation 12/20/20    Authorization Type Medicare    PT Start Time 1300    PT Stop Time 1340    PT Time Calculation (min) 40 min    Activity Tolerance Patient tolerated treatment well    Behavior During Therapy [facility] for tasks assessed/performed             Past Medical History:  Diagnosis Date   Anxiety    Arthritis    generalized   Bursitis of both hips    Cancer ([place]) 2010   Melanoma Left Arm   Cataract 2016,2017   Chronic kidney disease    Depression    Hyperlipidemia    Hypertension    Neuromuscular disorder ([place])    Peripheral Nueropathy ( Left Side)   Plantar fasciitis    PONV (postoperative nausea and vomiting)    2014 (per pt, hard time waking up)   Sleep apnea     Past Surgical History:  Procedure Laterality Date   BACK SURGERY  05/04/2013   Cervical Discectomy with Fusion by [place]   fibroadenoma   CATARACT EXTRACTION Left 06/14/2015   CATARACT EXTRACTION Right 05/08/2015   CATARACT EXTRACTION, BILATERAL  2017   CERVICAL SPINE SURGERY  2014   spinal fusion   MELANOMA EXCISION Left 2010   left arm   TONSILLECTOMY     removed at age 75   TRANSFORAMINAL LUMBAR INTERBODY FUSION (TLIF) WITH PEDICLE SCREW FIXATION 1 LEVEL N/A 04/11/2020   Procedure: TRANSFORAMINAL LUMBAR INTERBODY FUSION (TLIF) L5-S1, L4-5 LEFT LAMINOTOMY/ DECOMPRESSION;  Surgeon: Melina Schools, MD;  Location: [facility];  Service: Orthopedics;  Laterality: N/A;  5 hrs   TUBAL LIGATION      There were no vitals filed for  this visit.   Subjective Assessment - 12/20/20 1337     Subjective Patient reports her back and leg have benedoing great. She has had very little pain. She is cofortable with her pool and land program. She feels like she is ready for D/C    Limitations Walking;House hold activities;Lifting    How long can you sit comfortably? n/a    How long can you stand comfortably? n/a    How long can you walk comfortably? 5 minutes max of walking in backyard    Patient Stated Goals Continue to maintain back pain and ensure pain does not come back; improve stamina    Currently in Pain? No/denies                [facility] PT Assessment - 12/20/20 0001       Precautions   Precautions None      Restrictions   Weight Bearing Restrictions No      Balance Screen   Has the patient fallen in the past 6 months No    Has the patient had a decrease in activity level because of a fear of falling?  No    Is the patient reluctant to leave their home because of a fear of falling?  No  Home Environment   Living Environment Private residence    Living Arrangements Spouse/significant other    Available Help at Discharge Family    Type of [place] to enter    Entrance Stairs-Number of Steps 3      Prior Function   Level of [place] Retired    Leisure Staying active and completing gym exercise      Functional Tests   Functional tests Single leg stance      Single Leg Stance   Comments L LE: 10 sec; R LE: 5 sec      ROM / Strength   AROM / PROM / Strength Strength      Strength   Overall Strength Comments bilat hips grossly 5/5 except 4+/5 for left hip flexion; bilateral knee extension 5/5, bilateral knee flexion 5/5.    Strength Assessment Site Hip;Knee    Right/Left Hip Left;Right    Right Hip Flexion 5/5    Right Hip ABduction 5/5    Right Hip ADduction 5/5    Left Hip Flexion 4+/5    Left Hip ABduction 5/5    Left Hip ADduction 5/5     Right/Left Knee Right;Left    Right Knee Flexion 5/5    Right Knee Extension 5/5    Left Knee Flexion 5/5    Left Knee Extension 5/5      Standardized Balance Assessment   Standardized Balance Assessment Five Times Sit to Stand    Five times sit to stand comments  8 secs                           [facility] Adult PT Treatment/Exercise - 12/20/20 0001       Self-Care   Self-Care Other Self-Care Comments    Other Self-Care Comments  reviewed aquatic and land HEP to complete independently.      Exercises   Exercises Lumbar      Lumbar Exercises: Standing   Row Strengthening;Both;Theraband;15 reps    Theraband Level (Row) Level 3 Nyoka Cowden)    Row Limitations 1x15    Shoulder Extension Strengthening;Both;15 reps;Theraband    Theraband Level (Shoulder Extension) Level 3 (Green)    Shoulder Extension Limitations 1x15      Knee/Hip Exercises: Standing   Other Standing Knee Exercises marches 2x10 with balance emphasized    Other Standing Knee Exercises tandem gait balance 2x20 sec hold      Knee/Hip Exercises: Seated   Clamshell with TheraBand Red      Knee/Hip Exercises: Supine   Other Supine Knee/Hip Exercises clam shells 2x10 with blue band                    PT Education - 12/20/20 1338     Education Details reviewed HEP and symptom mangement    Person(s) Educated Patient    Methods Explanation;Verbal cues;Tactile cues;Demonstration    Comprehension Returned demonstration;Verbal cues required;Tactile cues required;Verbalized understanding              PT Short Term Goals - 12/11/20 1321       PT SHORT TERM GOAL #1   Status Achieved               PT Long Term Goals - 12/18/20 1029       PT LONG TERM GOAL #1   Status Achieved  Plan - 12/20/20 1339     Clinical Impression Statement Patient has reached all goals for therapy. Therapy reviewed her home program with her both on land and in the qwater.  She feels comfortable with her exercises. She has shown improvmenets in strength and motion. Her 5x sit to stand improved as well as her single leg stability. She is not having pain. We will at this time D/C to HEP.    Personal Factors and Comorbidities Age;Time since onset of injury/illness/exacerbation;Past/Current Experience    Examination-Activity Limitations Locomotion Level;Lift;Sleep    Examination-Participation Restrictions Yard Work;Shop;Cleaning;Laundry    Stability/Clinical Decision Making Stable/Uncomplicated    Clinical Decision Making Low    Rehab Potential Good    PT Duration 6 weeks    PT Treatment/Interventions Aquatic Therapy;ADLs/Self Care Home Management;Moist Heat;Electrical Stimulation;Cryotherapy;Gait training;Stair training;Functional mobility training;Therapeutic activities;Therapeutic exercise;Balance training;Neuromuscular re-education;Manual techniques;Patient/family education;Passive range of motion;Dry needling;Taping;Vasopneumatic Device;Spinal Manipulations;Joint Manipulations    PT Next Visit Plan [place] ??    PT Home Exercise Plan Ensure indep with aquatic HEP, laminate program.    Consulted and Agree with Plan of Care Patient             Patient will benefit from skilled therapeutic intervention in order to improve the following deficits and impairments:  Decreased endurance, Decreased strength, Decreased mobility, Improper body mechanics, Impaired sensation  Visit Diagnosis: Muscle weakness (generalized)  Other abnormalities of gait and mobility  PHYSICAL THERAPY DISCHARGE SUMMARY  Visits from Start of Care: 10  Current functional level related to goals / functional outcomes: Singiifcant improvement in pain    Remaining deficits: Pain at times   Education / Equipment: HEP    Patient agrees to discharge. Patient goals were met. Patient is being discharged due to meeting the stated rehab goals.    Problem List Patient Active Problem List    Diagnosis Date Noted   S/P lumbar fusion 04/11/2020   Pre-op evaluation 03/07/2020   Abnormal EKG 03/07/2020   Blood glucose abnormal 03/21/2019   Cardiovascular stress test abnormal 03/21/2019   Cervical disc disorder 03/21/2019   Disc disorder of lumbar region 03/21/2019   Depressive disorder 03/21/2019   Disorder of bone and articular cartilage 03/21/2019   Hyperlipidemia 03/21/2019   Impaired fasting glucose 03/21/2019   Mammogram abnormal 03/21/2019   Numbness of lower limb 03/21/2019   Osteopenia 03/21/2019   Seborrheic keratosis 03/21/2019   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   Varicose veins of lower extremity 03/21/2019   Vitamin D deficiency 03/21/2019   Spinal stenosis of lumbar region 01/20/2019   Bursitis of left hip 01/03/2019   Peripheral neuropathic pain 01/03/2019   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   Need for immunization against influenza 03/16/2018   Proteinuria 09/29/2017   Chronic kidney disease, stage 2 (mild) 09/29/2017   Overweight (BMI 25.0-29.9) 04/12/2017   Preop cardiovascular exam 12/17/2016   Prediabetes 12/17/2016   Stage 3 chronic kidney disease ([place]) 12/03/2016   Obstructive sleep apnea syndrome 04/08/2015    Carney Living 12/20/2020, 2:55 PM  [facility] [place], [place], [address] Phone: [phone]   Fax:  [phone]  Name: Starnisha Batrez MRN: [id number] Date of Birth: [birth year]

## 2020-12-20 NOTE — Patient Instructions (Signed)
Patient given full program for pool and supine.

## 2021-01-23 ENCOUNTER — Telehealth: Payer: Medicare Other | Admitting: Internal Medicine

## 2021-02-25 ENCOUNTER — Ambulatory Visit: Payer: Medicare Other | Admitting: Internal Medicine

## 2021-03-11 IMAGING — RF DG LUMBAR SPINE COMPLETE 4+V
1 series · 4 of 4 positions shown · non-contrast
Comparison: Lumbar spine MRI 01/11/2019.

CLINICAL DATA: Surgery, elective. Additional history provided:
L5-S1 TLIF, L4-5 left laminotomy and decompression. Provided
fluoroscopy time 3 minutes, 15 seconds (150.90 mGy).

EXAM:
LUMBAR SPINE - COMPLETE 4+ VIEW; DG C-ARM 1-60 MIN

[Series 1: run · 4 of 4 slices shown]
[im 1/4]
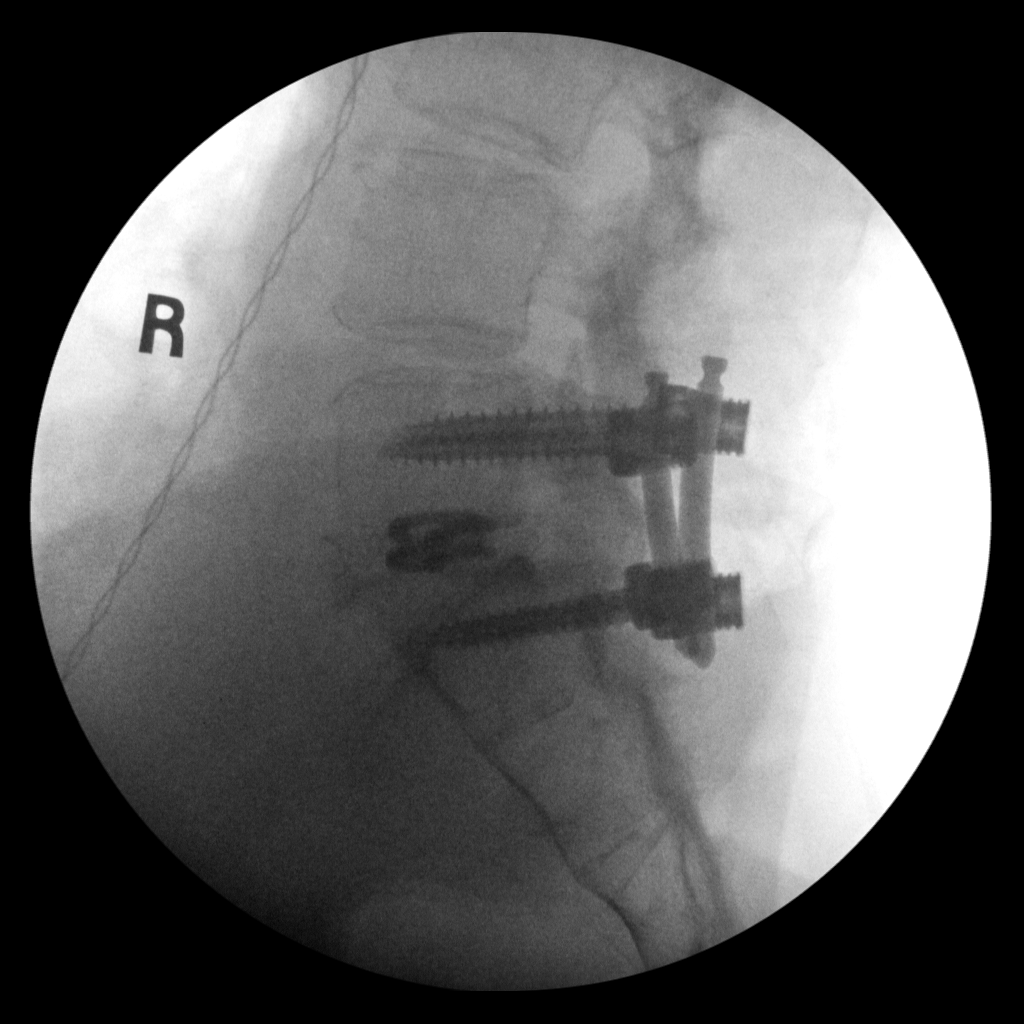
[im 2/4]
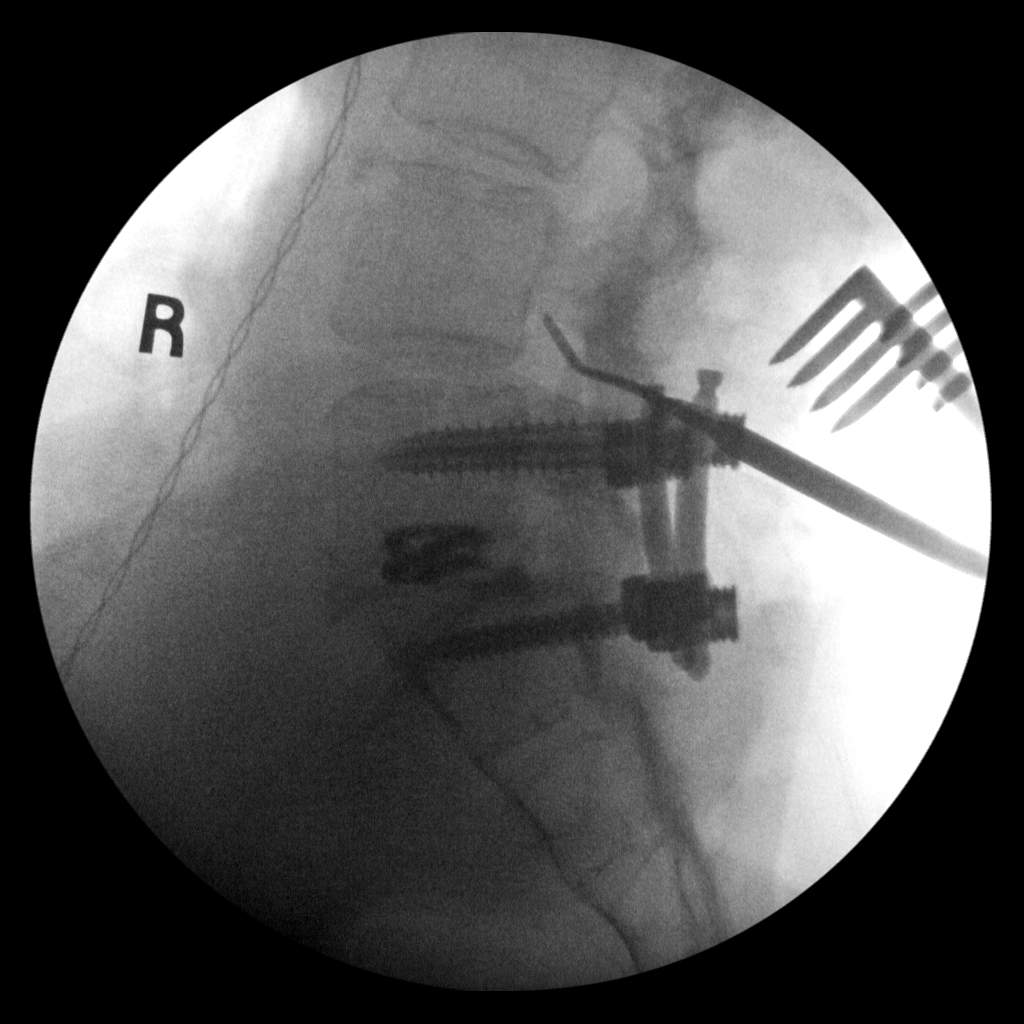
[im 3/4]
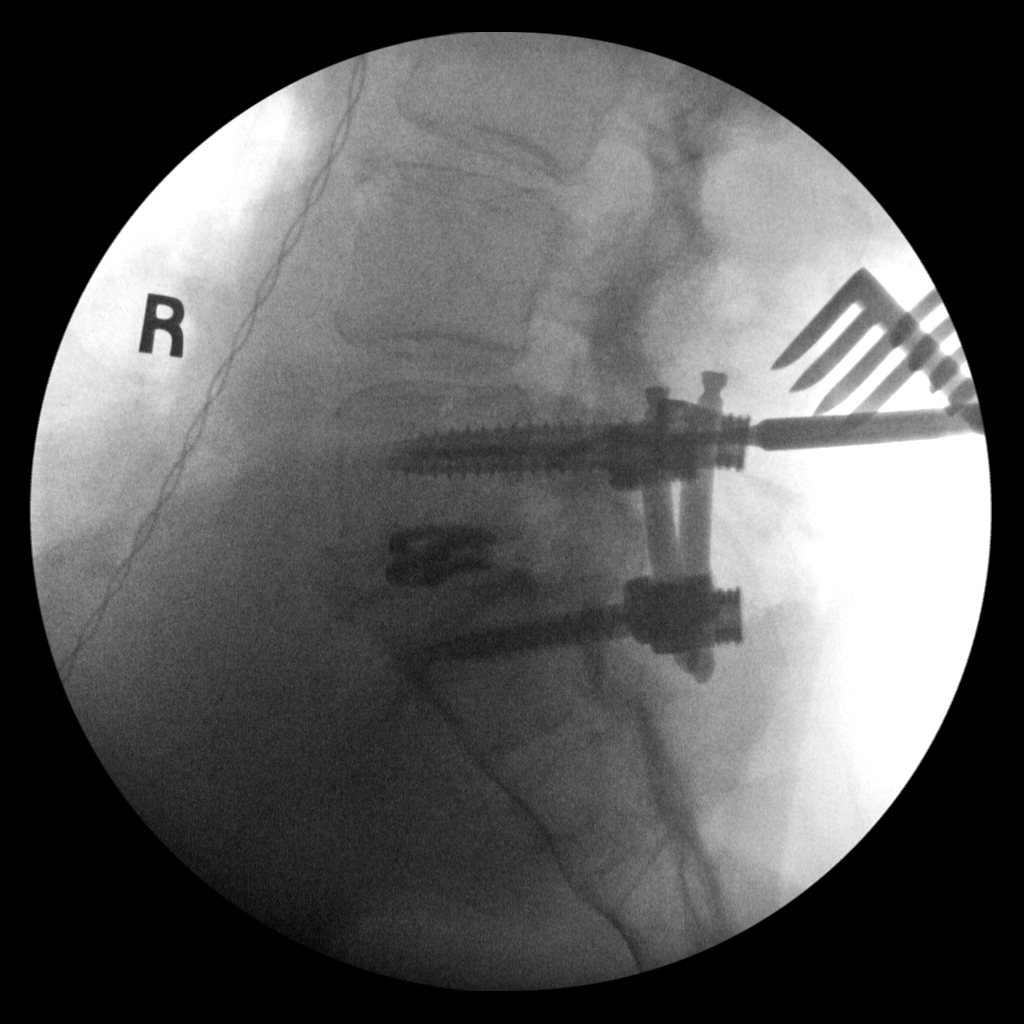
[im 4/4]
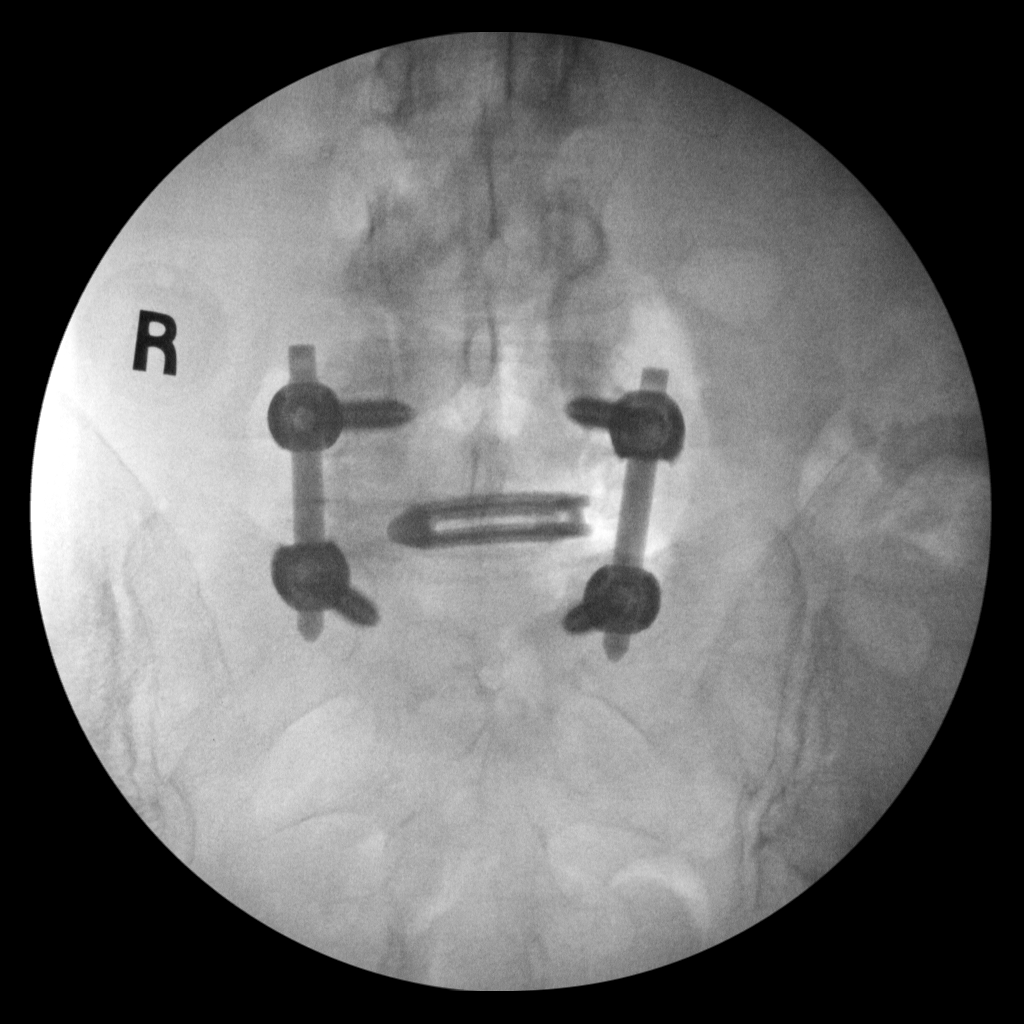

[4 of 4 positions shown; findings below may reference images not displayed]

FINDINGS: PA and lateral view intraoperative fluoroscopic images of the lumbar
spine are submitted, 4 images total. The images demonstrate a
posterior spinal fusion construct at the L5-S1 level utilizing
bilateral pedicle screws and vertical interconnecting rods. An L5-S1
interbody spacer is also present. On the lateral view fluoroscopic
images taken at [DATE] a.m., a surgical instrument projects in the
region of the L4-L5 neural foramina and there are retractors
posterior to the L4-L5 level.
IMPRESSION: Four intraoperative fluoroscopic images of the lumbar spine, as
detailed.

## 2021-03-28 ENCOUNTER — Other Ambulatory Visit: Payer: Self-pay | Admitting: Internal Medicine

## 2021-05-14 ENCOUNTER — Ambulatory Visit: Payer: Medicare Other | Admitting: Internal Medicine

## 2021-05-14 ENCOUNTER — Ambulatory Visit (INDEPENDENT_AMBULATORY_CARE_PROVIDER_SITE_OTHER): Payer: Medicare Other

## 2021-05-14 VITALS — Ht 65.25 in | Wt 173.0 lb

## 2021-05-14 DIAGNOSIS — Z0000 Encounter for general adult medical examination without abnormal findings: Secondary | ICD-10-CM

## 2021-05-14 DIAGNOSIS — Z1231 Encounter for screening mammogram for malignant neoplasm of breast: Secondary | ICD-10-CM

## 2021-05-14 NOTE — Progress Notes (Signed)
I connected with Jenny [name] today by telephone and verified that I am speaking with the correct person using two identifiers. Location patient: home Location provider: work Persons participating in the virtual visit: Chrislyn, Seedorf LPN.   I discussed the limitations, risks, security and privacy concerns of performing an evaluation and management service by telephone and the availability of in person appointments. I also discussed with the patient that there may be a patient responsible charge related to this service. The patient expressed understanding and verbally consented to this telephonic visit.    Interactive audio and video telecommunications were attempted between this provider and patient, however failed, due to patient having technical difficulties OR patient did not have access to video capability.  We continued and completed visit with audio only.     Vital signs may be patient reported or missing.  Subjective:   Jenny [name] is a 75 y.o. female who presents for Medicare Annual (Subsequent) preventive examination.  Review of Systems     Cardiac Risk Factors include: advanced age (>39men, >42 women);dyslipidemia     Objective:    Today's Vitals   05/14/21 0955  Weight: 173 lb (78.5 kg)  Height: 5' 5.25" (1.657 m)   Body mass index is 28.57 kg/m.  Advanced Directives 05/14/2021 11/06/2020 05/02/2020 04/11/2020 04/09/2020  Does Patient Have a Medical Advance Directive? No No No No No  Would patient like information on creating a medical advance directive? - Yes (MAU/Ambulatory/Procedural Areas - Information given) - No - Patient declined No - Patient declined    Current Medications (verified) Outpatient Encounter Medications as of 05/14/2021  Medication Sig   Alpha-D-Galactosidase (BEANO PO) Take by mouth. prn   amoxicillin (AMOXIL) 500 MG capsule Take 4 caps 1 hour prior to dental procedure   atorvastatin (LIPITOR) 10 MG tablet Take 1  tablet (10 mg total) by mouth daily.   DENTA 5000 PLUS 1.1 % CREA dental cream PLACE 1 APPLICATION ONTO TEETH AT BEDTIME AS DIRECTED   diphenhydrAMINE (BENADRYL) 25 mg capsule Take 25 mg by mouth at bedtime as needed for sleep.    Lactase (LACTAID PO) Take by mouth. prn   PARoxetine (PAXIL) 20 MG tablet Take 1 tablet (20 mg total) by mouth daily.   No facility-administered encounter medications on file as of 05/14/2021.    Allergies (verified) Clindamycin, Prochlorperazine, Ibandronic acid, Raloxifene, Diclofenac sodium, and Penicillin v potassium   History: Past Medical History:  Diagnosis Date   Anxiety    Arthritis    generalized   Bursitis of both hips    Cancer ([place]) 2010   Melanoma Left Arm   Cataract 2016,2017   Chronic kidney disease    Depression    Hyperlipidemia    Hypertension    Neuromuscular disorder ([place])    Peripheral Nueropathy ( Left Side)   Plantar fasciitis    PONV (postoperative nausea and vomiting)    2014 (per pt, hard time waking up)   Sleep apnea    Past Surgical History:  Procedure Laterality Date   BACK SURGERY  05/04/2013   Cervical Discectomy with Fusion by [place]   fibroadenoma   CATARACT EXTRACTION Left 06/14/2015   CATARACT EXTRACTION Right 05/08/2015   CATARACT EXTRACTION, BILATERAL  2017   CERVICAL SPINE SURGERY  2014   spinal fusion   MELANOMA EXCISION Left 2010   left arm   TONSILLECTOMY     removed at age 36   TRANSFORAMINAL LUMBAR INTERBODY FUSION (  TLIF) WITH PEDICLE SCREW FIXATION 1 LEVEL N/A 04/11/2020   Procedure: TRANSFORAMINAL LUMBAR INTERBODY FUSION (TLIF) L5-S1, L4-5 LEFT LAMINOTOMY/ DECOMPRESSION;  Surgeon: Melina Schools, MD;  Location: [facility];  Service: Orthopedics;  Laterality: N/A;  5 hrs   TUBAL LIGATION     Family History  Problem Relation Age of Onset   COPD Mother    Psychosis Mother    COPD Father    Depression Father    Hypertension Father    Lupus Daughter    Depression  Paternal Aunt    Heart disease Paternal Grandmother    Social History   Socioeconomic History   Marital status: Married    Spouse name: Not on file   Number of children: 2   Years of education: Not on file   Highest education level: Not on file  Occupational History   Not on file  Tobacco Use   Smoking status: Former    Packs/day: 0.25    Years: 2.00    Pack years: 0.50    Types: Cigarettes    Quit date: 09/26/1968    Years since quitting: 52.6   Smokeless tobacco: Never  Vaping Use   Vaping Use: Never used  Substance and Sexual Activity   Alcohol use: Yes    Alcohol/week: 2.0 standard drinks    Types: 1 Glasses of wine, 1 Standard drinks or equivalent per week    Comment: Once a Week   Drug use: Never   Sexual activity: Yes    Birth control/protection: Post-menopausal  Other Topics Concern   Not on file  Social History Narrative   Not on file   Social Determinants of Health   Financial Resource Strain: Low Risk    Difficulty of Paying Living Expenses: Not hard at all  Food Insecurity: No Food Insecurity   Worried About Charity fundraiser in the Last Year: Never true   Ran Out of Food in the Last Year: Never true  Transportation Needs: No Transportation Needs   Lack of Transportation (Medical): No   Lack of Transportation (Non-Medical): No  Physical Activity: Insufficiently Active   Days of Exercise per Week: 3 days   Minutes of Exercise per Session: 40 min  Stress: No Stress Concern Present   Feeling of Stress : Only a little  Social Connections: Not on file    Tobacco Counseling Counseling given: Not Answered   Clinical Intake:  Pre-visit preparation completed: Yes  Pain : No/denies pain     Nutritional Status: BMI 25 -29 Overweight Nutritional Risks: None Diabetes: No  How often do you need to have someone help you when you read instructions, pamphlets, or other written materials from your doctor or pharmacy?: 1 - Never What is the last grade  level you completed in school?: college  Diabetic? no  Interpreter Needed?: No  Information entered by :: NAllen LPN   Activities of Daily Living In your present state of health, do you have any difficulty performing the following activities: 05/14/2021  Hearing? N  Vision? N  Difficulty concentrating or making decisions? Y  Walking or climbing stairs? Y  Dressing or bathing? N  Doing errands, shopping? N  Preparing Food and eating ? N  Using the Toilet? N  In the past six months, have you accidently leaked urine? N  Do you have problems with loss of bowel control? N  Managing your Medications? N  Managing your Finances? N  Housekeeping or managing your Housekeeping? N  Some recent data  might be hidden    Patient Care Team: Glendale Chard, MD as PCP - General (Internal Medicine)  Indicate any recent Medical Services you may have received from other than [facility] providers in the past year (date may be approximate).     Assessment:   This is a routine wellness examination for [place].  Hearing/Vision screen Vision Screening - Comments:: Regular eye exams, Dr. Alois Cliche  Dietary issues and exercise activities discussed: Current Exercise Habits: Structured exercise class, Type of exercise: Other - see comments (water aerobics), Time (Minutes): 45, Frequency (Times/Week): 3, Weekly Exercise (Minutes/Week): 135   Goals Addressed             This Visit's Progress    Patient Stated       05/14/2021, wants to lose 5 pounds       Depression Screen PHQ 2/9 Scores 05/14/2021 11/28/2020 05/02/2020 02/20/2020 09/27/2019 09/27/2019  PHQ - 2 Score 0 0 0 0 0 0  PHQ- 9 Score - - - 3 0 -    Fall Risk Fall Risk  05/14/2021 05/02/2020 09/27/2019  Falls in the past year? 0 1 1  Comment - legs gave out Patient fell out of chair with wheels  Number falls in past yr: - 0 0  Injury with Fall? - 0 1  Comment - - Jammed Thumb  Risk for fall due to : Medication side effect Medication side  effect -  Follow up Falls evaluation completed;Education provided;Falls prevention discussed Falls evaluation completed;Education provided;Falls prevention discussed -    FALL RISK PREVENTION PERTAINING TO THE HOME:  Any stairs in or around the home? Yes  If so, are there any without handrails? No  Home free of loose throw rugs in walkways, pet beds, electrical cords, etc? Yes  Adequate lighting in your home to reduce risk of falls? Yes   ASSISTIVE DEVICES UTILIZED TO PREVENT FALLS:  Life alert? No  Use of a cane, walker or w/c? No  Grab bars in the bathroom? Yes  Shower chair or bench in shower? Yes  Elevated toilet seat or a handicapped toilet? Yes   TIMED UP AND GO:  Was the test performed? No .      Cognitive Function:     6CIT Screen 05/14/2021 05/02/2020  What Year? 0 points 0 points  What month? 0 points 0 points  What time? 0 points 0 points  Count back from 20 0 points 0 points  Months in reverse 0 points 0 points  Repeat phrase 0 points 2 points  Total Score 0 2    Immunizations Immunization History  Administered Date(s) Administered   Influenza Split 03/28/2010, 02/10/2011, 05/11/2012   Influenza, High Dose Seasonal PF 02/05/2016, 04/06/2017, 03/16/2018   Influenza, Quadrivalent, Recombinant, Inj, Pf 03/20/2019   Influenza, Seasonal, Injecte, Preservative Fre 04/04/2013   Influenza,inj,Quad PF,6+ Mos 04/11/2014, 03/15/2015   PFIZER(Purple Top)SARS-COV-2 Vaccination 07/24/2019, 08/14/2019, 05/14/2020   Pfizer Covid-19 Vaccine Bivalent Booster 23yrs & up 03/17/2021   Pneumococcal Conjugate-13 07/13/2014   Pneumococcal Polysaccharide-23 02/05/2016   Pneumococcal-Unspecified 03/30/2008   Tdap 05/11/2011   Zoster, Live 04/08/2016    TDAP status: Due, Education has been provided regarding the importance of this vaccine. Advised may receive this vaccine at local pharmacy or Health Dept. Aware to provide a copy of the vaccination record if obtained from local  pharmacy or Health Dept. Verbalized acceptance and understanding.  Flu Vaccine status: Up to date  Pneumococcal vaccine status: Up to date  Covid-19 vaccine status: Completed vaccines  Qualifies for Shingles Vaccine? Yes   Zostavax completed Yes   Shingrix Completed?: No.    Education has been provided regarding the importance of this vaccine. Patient has been advised to call insurance company to determine out of pocket expense if they have not yet received this vaccine. Advised may also receive vaccine at local pharmacy or Health Dept. Verbalized acceptance and understanding.  Screening Tests Health Maintenance  Topic Date Due   Zoster Vaccines- Shingrix (1 of 2) Never done   TETANUS/TDAP  05/10/2021   COLONOSCOPY (Pts 45-97yrs Insurance coverage will need to be confirmed)  02/14/2022   Pneumonia Vaccine 34+ Years old  Completed   INFLUENZA VACCINE  Completed   DEXA SCAN  Completed   COVID-19 Vaccine  Completed   Hepatitis C Screening  Completed   HPV VACCINES  Aged Out    Health Maintenance  Health Maintenance Due  Topic Date Due   Zoster Vaccines- Shingrix (1 of 2) Never done   TETANUS/TDAP  05/10/2021    Colorectal cancer screening: No longer required.   Mammogram status: Completed 03/20/2019. Repeat every year  Bone Density status: Completed 06/16/2010.   Lung Cancer Screening: (Low Dose CT Chest recommended if Age 55-80 years, 30 pack-year currently smoking OR have quit w/in 15years.) does not qualify.   Lung Cancer Screening Referral: no  Additional Screening:  Hepatitis C Screening: does qualify; Completed 05/02/2020  Vision Screening: Recommended annual ophthalmology exams for early detection of glaucoma and other disorders of the eye. Is the patient up to date with their annual eye exam?  Yes  Who is the provider or what is the name of the office in which the patient attends annual eye exams? Dr. Idolina Primer If pt is not established with a provider, would they like  to be referred to a provider to establish care? No .   Dental Screening: Recommended annual dental exams for proper oral hygiene  Community Resource Referral / Chronic Care Management: CRR required this visit?  No   CCM required this visit?  No      Plan:     I have personally reviewed and noted the following in the patients chart:   Medical and social history Use of alcohol, tobacco or illicit drugs  Current medications and supplements including opioid prescriptions.  Functional ability and status Nutritional status Physical activity Advanced directives List of other physicians Hospitalizations, surgeries, and ER visits in previous 12 months Vitals Screenings to include cognitive, depression, and falls Referrals and appointments  In addition, I have reviewed and discussed with patient certain preventive protocols, quality metrics, and best practice recommendations. A written personalized care plan for preventive services as well as general preventive health recommendations were provided to patient.     Kellie Simmering, LPN   [id number]   Nurse Notes: none

## 2021-05-14 NOTE — Patient Instructions (Signed)
Jenny [name] , Thank you for taking time to come for your Medicare Wellness Visit. I appreciate your ongoing commitment to your health goals. Please review the following plan we discussed and let me know if I can assist you in the future.   Screening recommendations/referrals: Colonoscopy: not required Mammogram: ordered today Bone Density: completed 06/16/2010 Recommended yearly ophthalmology/optometry visit for glaucoma screening and checkup Recommended yearly dental visit for hygiene and checkup  Vaccinations: Influenza vaccine: completed 04/15/2021 Pneumococcal vaccine: completed 02/05/2016 Tdap vaccine: due Shingles vaccine: discussed   Covid-19: 03/17/2021, 05/14/2020, 08/14/2019, 07/24/2019  Advanced directives: Advance directive discussed with you today.   Conditions/risks identified: none  Next appointment: Follow up in one year for your annual wellness visit    Preventive Care 65 Years and Older, Female Preventive care refers to lifestyle choices and visits with your health care provider that can promote health and wellness. What does preventive care include? A yearly physical exam. This is also called an annual well check. Dental exams once or twice a year. Routine eye exams. Ask your health care provider how often you should have your eyes checked. Personal lifestyle choices, including: Daily care of your teeth and gums. Regular physical activity. Eating a healthy diet. Avoiding tobacco and drug use. Limiting alcohol use. Practicing safe sex. Taking low-dose aspirin every day. Taking vitamin and mineral supplements as recommended by your health care provider. What happens during an annual well check? The services and screenings done by your health care provider during your annual well check will depend on your age, overall health, lifestyle risk factors, and family history of disease. Counseling  Your health care provider may ask you questions about your: Alcohol  use. Tobacco use. Drug use. Emotional well-being. Home and relationship well-being. Sexual activity. Eating habits. History of falls. Memory and ability to understand (cognition). Work and work Statistician. Reproductive health. Screening  You may have the following tests or measurements: Height, weight, and BMI. Blood pressure. Lipid and cholesterol levels. These may be checked every 5 years, or more frequently if you are over 50 years old. Skin check. Lung cancer screening. You may have this screening every year starting at age 36 if you have a 30-pack-year history of smoking and currently smoke or have quit within the past 15 years. Fecal occult blood test (FOBT) of the stool. You may have this test every year starting at age 64. Flexible sigmoidoscopy or colonoscopy. You may have a sigmoidoscopy every 5 years or a colonoscopy every 10 years starting at age 68. Hepatitis C blood test. Hepatitis B blood test. Sexually transmitted disease (STD) testing. Diabetes screening. This is done by checking your blood sugar (glucose) after you have not eaten for a while (fasting). You may have this done every 1-3 years. Bone density scan. This is done to screen for osteoporosis. You may have this done starting at age 50. Mammogram. This may be done every 1-2 years. Talk to your health care provider about how often you should have regular mammograms. Talk with your health care provider about your test results, treatment options, and if necessary, the need for more tests. Vaccines  Your health care provider may recommend certain vaccines, such as: Influenza vaccine. This is recommended every year. Tetanus, diphtheria, and acellular pertussis (Tdap, Td) vaccine. You may need a Td booster every 10 years. Zoster vaccine. You may need this after age 12. Pneumococcal 13-valent conjugate (PCV13) vaccine. One dose is recommended after age 72. Pneumococcal polysaccharide (PPSV23) vaccine. One dose is  recommended after age 17. Talk to your health care provider about which screenings and vaccines you need and how often you need them. This information is not intended to replace advice given to you by your health care provider. Make sure you discuss any questions you have with your health care provider. Document Released: 06/07/2015 Document Revised: 01/29/2016 Document Reviewed: 03/12/2015 Elsevier Interactive Patient Education  2017 [employer] Prevention in the Home Falls can cause injuries. They can happen to people of all ages. There are many things you can do to make your home safe and to help prevent falls. What can I do on the outside of my home? Regularly fix the edges of walkways and driveways and fix any cracks. Remove anything that might make you trip as you walk through a door, such as a raised step or threshold. Trim any bushes or trees on the path to your home. Use bright outdoor lighting. Clear any walking paths of anything that might make someone trip, such as rocks or tools. Regularly check to see if handrails are loose or broken. Make sure that both sides of any steps have handrails. Any raised decks and porches should have guardrails on the edges. Have any leaves, snow, or ice cleared regularly. Use sand or salt on walking paths during winter. Clean up any spills in your garage right away. This includes oil or grease spills. What can I do in the bathroom? Use night lights. Install grab bars by the toilet and in the tub and shower. Do not use towel bars as grab bars. Use non-skid mats or decals in the tub or shower. If you need to sit down in the shower, use a plastic, non-slip stool. Keep the floor dry. Clean up any water that spills on the floor as soon as it happens. Remove soap buildup in the tub or shower regularly. Attach bath mats securely with double-sided non-slip rug tape. Do not have throw rugs and other things on the floor that can make you  trip. What can I do in the bedroom? Use night lights. Make sure that you have a light by your bed that is easy to reach. Do not use any sheets or blankets that are too big for your bed. They should not hang down onto the floor. Have a firm chair that has side arms. You can use this for support while you get dressed. Do not have throw rugs and other things on the floor that can make you trip. What can I do in the kitchen? Clean up any spills right away. Avoid walking on wet floors. Keep items that you use a lot in easy-to-reach places. If you need to reach something above you, use a strong step stool that has a grab bar. Keep electrical cords out of the way. Do not use floor polish or wax that makes floors slippery. If you must use wax, use non-skid floor wax. Do not have throw rugs and other things on the floor that can make you trip. What can I do with my stairs? Do not leave any items on the stairs. Make sure that there are handrails on both sides of the stairs and use them. Fix handrails that are broken or loose. Make sure that handrails are as long as the stairways. Check any carpeting to make sure that it is firmly attached to the stairs. Fix any carpet that is loose or worn. Avoid having throw rugs at the top or bottom of the stairs. If you  do have throw rugs, attach them to the floor with carpet tape. Make sure that you have a light switch at the top of the stairs and the bottom of the stairs. If you do not have them, ask someone to add them for you. What else can I do to help prevent falls? Wear shoes that: Do not have high heels. Have rubber bottoms. Are comfortable and fit you well. Are closed at the toe. Do not wear sandals. If you use a stepladder: Make sure that it is fully opened. Do not climb a closed stepladder. Make sure that both sides of the stepladder are locked into place. Ask someone to hold it for you, if possible. Clearly mark and make sure that you can  see: Any grab bars or handrails. First and last steps. Where the edge of each step is. Use tools that help you move around (mobility aids) if they are needed. These include: Canes. Walkers. Scooters. Crutches. Turn on the lights when you go into a dark area. Replace any light bulbs as soon as they burn out. Set up your furniture so you have a clear path. Avoid moving your furniture around. If any of your floors are uneven, fix them. If there are any pets around you, be aware of where they are. Review your medicines with your doctor. Some medicines can make you feel dizzy. This can increase your chance of falling. Ask your doctor what other things that you can do to help prevent falls. This information is not intended to replace advice given to you by your health care provider. Make sure you discuss any questions you have with your health care provider. Document Released: 03/07/2009 Document Revised: 10/17/2015 Document Reviewed: 06/15/2014 Elsevier Interactive Patient Education  2017 [employer].

## 2021-06-03 ENCOUNTER — Other Ambulatory Visit: Payer: Self-pay

## 2021-06-03 ENCOUNTER — Ambulatory Visit (INDEPENDENT_AMBULATORY_CARE_PROVIDER_SITE_OTHER): Payer: Medicare Other | Admitting: Internal Medicine

## 2021-06-03 ENCOUNTER — Encounter: Payer: Self-pay | Admitting: Internal Medicine

## 2021-06-03 VITALS — BP 122/80 | HR 78 | Temp 98.1°F | Ht 65.0 in | Wt 173.2 lb

## 2021-06-03 DIAGNOSIS — F32A Depression, unspecified: Secondary | ICD-10-CM | POA: Diagnosis not present

## 2021-06-03 DIAGNOSIS — E663 Overweight: Secondary | ICD-10-CM | POA: Diagnosis not present

## 2021-06-03 DIAGNOSIS — Z79899 Other long term (current) drug therapy: Secondary | ICD-10-CM

## 2021-06-03 DIAGNOSIS — Z6828 Body mass index (BMI) 28.0-28.9, adult: Secondary | ICD-10-CM

## 2021-06-03 DIAGNOSIS — E7800 Pure hypercholesterolemia, unspecified: Secondary | ICD-10-CM | POA: Diagnosis not present

## 2021-06-03 MED ORDER — ATORVASTATIN CALCIUM 10 MG PO TABS: 10.0000 mg | ORAL_TABLET | Freq: Every day | ORAL | 2 refills | Status: DC

## 2021-06-03 MED ORDER — PAROXETINE HCL 20 MG PO TABS: 20.0000 mg | ORAL_TABLET | Freq: Every day | ORAL | 2 refills | Status: DC

## 2021-06-03 NOTE — Progress Notes (Signed)
I,[name],acting as a scribe for Maximino Greenland, MD.,have documented all relevant documentation on the behalf of Maximino Greenland, MD,as directed by  Maximino Greenland, MD while in the presence of Maximino Greenland, MD.  This visit occurred during the SARS-CoV-2 public health emergency.  Safety protocols were in place, including screening questions prior to the visit, additional usage of staff PPE, and extensive cleaning of exam room while observing appropriate contact time as indicated for disinfecting solutions.  Subjective:     Patient ID: Jenny [name] , female    DOB: 1945/11/30 , 76 y.o.   MRN: [id number]   Chief Complaint  Patient presents with   Hyperlipidemia    HPI  Pt presents today for chol f/u. She reports compliance with atorvastatin. She has not had any issues with the medication. She denies myalgias, headaches, chest pain and shortness of breath.   Hyperlipidemia This is a chronic problem. The current episode started more than 1 year ago. The problem is controlled. She has no history of chronic renal disease or hypothyroidism. Pertinent negatives include no chest pain, focal weakness or shortness of breath. Current antihyperlipidemic treatment includes statins. The current treatment provides moderate improvement of lipids. Risk factors for coronary artery disease include dyslipidemia and post-menopausal.    Past Medical History:  Diagnosis Date   Anxiety    Arthritis    generalized   Bursitis of both hips    Cancer ([place]) 2010   Melanoma Left Arm   Cataract 2016,2017   Chronic kidney disease    Depression    Hyperlipidemia    Hypertension    Neuromuscular disorder ([place])    Peripheral Nueropathy ( Left Side)   Plantar fasciitis    PONV (postoperative nausea and vomiting)    2014 (per pt, hard time waking up)   Sleep apnea      Family History  Problem Relation Age of Onset   COPD Mother    Psychosis Mother    COPD Father    Depression Father     Hypertension Father    Lupus Daughter    Depression Paternal Aunt    Heart disease Paternal Grandmother      Current Outpatient Medications:    Alpha-D-Galactosidase (BEANO PO), Take by mouth. prn, Disp: , Rfl:    DENTA 5000 PLUS 1.1 % CREA dental cream, PLACE 1 APPLICATION ONTO TEETH AT BEDTIME AS DIRECTED, Disp: 51 g, Rfl: 3   diphenhydrAMINE (BENADRYL) 25 mg capsule, Take 25 mg by mouth at bedtime as needed for sleep. , Disp: , Rfl:    Lactase (LACTAID PO), Take by mouth. prn, Disp: , Rfl:    amoxicillin (AMOXIL) 500 MG capsule, Take 4 caps 1 hour prior to dental procedure (Patient not taking: Reported on 06/03/2021), Disp: 8 capsule, Rfl: 1   atorvastatin (LIPITOR) 10 MG tablet, Take 1 tablet (10 mg total) by mouth daily., Disp: 90 tablet, Rfl: 2   PARoxetine (PAXIL) 20 MG tablet, Take 1 tablet (20 mg total) by mouth daily., Disp: 90 tablet, Rfl: 2   Allergies  Allergen Reactions   Clindamycin Hives, Itching and Rash   Prochlorperazine Palpitations    Rapid heart rate, insomnia, made her "super hyper"   Ibandronic Acid     Lower Jaw Pain   Raloxifene     Lower Jaw Pain   Diclofenac Sodium Other (See Comments)    Turns feces white   Penicillin V Potassium Rash     Review of Systems  Constitutional: Negative.   Respiratory: Negative.  Negative for shortness of breath.   Cardiovascular: Negative.  Negative for chest pain.  Neurological: Negative.  Negative for focal weakness.  Psychiatric/Behavioral: Negative.      Today's Vitals   06/03/21 1101  BP: 122/80  Pulse: 78  Temp: 98.1 F (36.7 C)  Weight: 173 lb 3.2 oz (78.6 kg)  Height: 5' 5" (1.651 m)   Body mass index is 28.82 kg/m.  Wt Readings from Last 3 Encounters:  06/03/21 173 lb 3.2 oz (78.6 kg)  05/14/21 173 lb (78.5 kg)  11/28/20 176 lb 6.4 oz (80 kg)    Objective:  Physical Exam Vitals and nursing note reviewed.  Constitutional:      Appearance: Normal appearance.  HENT:     Head: Normocephalic and  atraumatic.     Nose:     Comments: Masked     Mouth/Throat:     Comments: Masked  Eyes:     Extraocular Movements: Extraocular movements intact.  Cardiovascular:     Rate and Rhythm: Normal rate and regular rhythm.     Heart sounds: Normal heart sounds.  Pulmonary:     Effort: Pulmonary effort is normal.     Breath sounds: Normal breath sounds.  Musculoskeletal:     Cervical back: Normal range of motion.  Skin:    General: Skin is warm.  Neurological:     General: No focal deficit present.     Mental Status: She is alert.  Psychiatric:        Mood and Affect: Mood normal.        Behavior: Behavior normal.        Assessment And Plan:     1. Pure hypercholesterolemia Comments: Chronic, I will check fasting lipid panel and LFTs. She will rto in six months for a full physical examination.  - CMP14+EGFR - Lipid panel  2. Depressive disorder Comments: Chronic, sx well controlled with paroxetine 26m daily. Refill sent to [facility] She will rto in six months for physical examination.   3. Overweight with body mass index (BMI) of 28 to 28.9 in adult Comments: Her BMI is acceptable for her demographics. She is encouraged to aim for at least 150 minutes of exercise per week.  4. Drug therapy   Patient was given opportunity to ask questions. Patient verbalized understanding of the plan and was able to repeat key elements of the plan. All questions were answered to their satisfaction.   I, RMaximino Greenland MD, have reviewed all documentation for this visit. The documentation on 06/03/21 for the exam, diagnosis, procedures, and orders are all accurate and complete.   IF YOU HAVE BEEN REFERRED TO A SPECIALIST, IT MAY TAKE 1-2 WEEKS TO SCHEDULE/PROCESS THE REFERRAL. IF YOU HAVE NOT HEARD FROM US/SPECIALIST IN TWO WEEKS, PLEASE GIVE [place] CALL AT [phone].   THE PATIENT IS ENCOURAGED TO PRACTICE SOCIAL DISTANCING DUE TO THE COVID-19 PANDEMIC.

## 2021-06-03 NOTE — Patient Instructions (Signed)

## 2021-06-04 LAB — LIPID PANEL
Chol/HDL Ratio: 2.7 ratio (ref 0.0–4.4)
Cholesterol, Total: 173 mg/dL (ref 100–199)
HDL: 64 mg/dL (ref >39)
LDL Chol Calc (NIH): 89 mg/dL (ref 0–99)
Triglycerides: 112 mg/dL (ref 0–149)
VLDL Cholesterol Cal: 20 mg/dL (ref 5–40)

## 2021-06-04 LAB — CMP14+EGFR
ALT: 16 IU/L (ref 0–32)
AST: 19 IU/L (ref 0–40)
Albumin/Globulin Ratio: 2 (ref 1.2–2.2)
Albumin: 4.4 g/dL (ref 3.7–4.7)
Alkaline Phosphatase: 98 IU/L (ref 44–121)
BUN/Creatinine Ratio: 24 (ref 12–28)
BUN: 23 mg/dL (ref 8–27)
Bilirubin Total: 0.3 mg/dL (ref 0.0–1.2)
CO2: 23 mmol/L (ref 20–29)
Calcium: 9.3 mg/dL (ref 8.7–10.3)
Chloride: 106 mmol/L (ref 96–106)
Creatinine, Ser: 0.97 mg/dL (ref 0.57–1.00)
Globulin, Total: 2.2 g/dL (ref 1.5–4.5)
Glucose: 96 mg/dL (ref 70–99)
Potassium: 4.9 mmol/L (ref 3.5–5.2)
Sodium: 142 mmol/L (ref 134–144)
Total Protein: 6.6 g/dL (ref 6.0–8.5)
eGFR: 61 mL/min/{1.73_m2} (ref >59)

## 2021-06-19 ENCOUNTER — Telehealth: Payer: Self-pay

## 2021-06-19 NOTE — Telephone Encounter (Signed)
This patient called asking If you could send in a referral to [facility] in [place] for a mammogram. She stated she has an appt with them 2/24 [facility]

## 2021-06-23 ENCOUNTER — Other Ambulatory Visit: Payer: Self-pay | Admitting: Internal Medicine

## 2021-07-18 LAB — HM MAMMOGRAPHY

## 2021-07-22 ENCOUNTER — Encounter: Payer: Self-pay | Admitting: Internal Medicine

## 2021-09-08 IMAGING — CT CT BIOPSY
2 of 5 series · 13 of 32 positions shown, 18 images · non-contrast
Comparison: none

CLINICAL DATA: 74-year-old female with right lower back and buttock
pain concerning for possible sacroiliac joint syndrome.

[Series 2: needle -guided injection · axial · 0.83mm/px · z∈[-88,-42]mm · 6 of 33 slices shown (1 of 2)]
[im 5/33  soft-tissue]
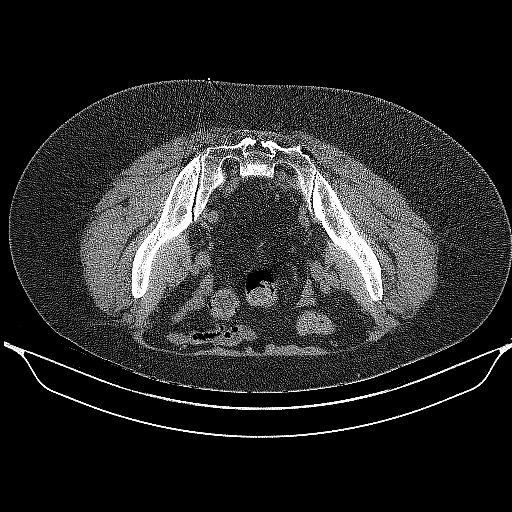
[im 10/33  soft-tissue]
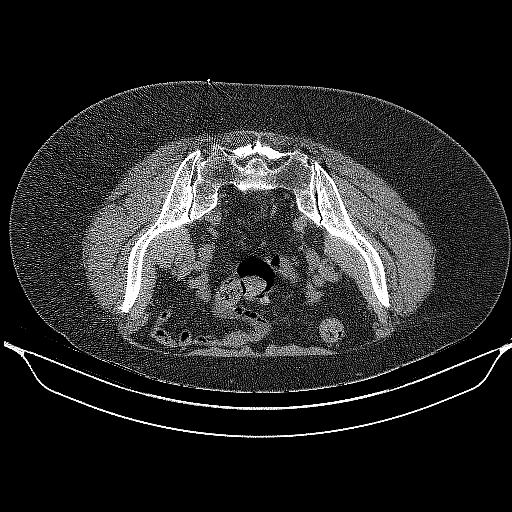
[im 14/33  soft-tissue]
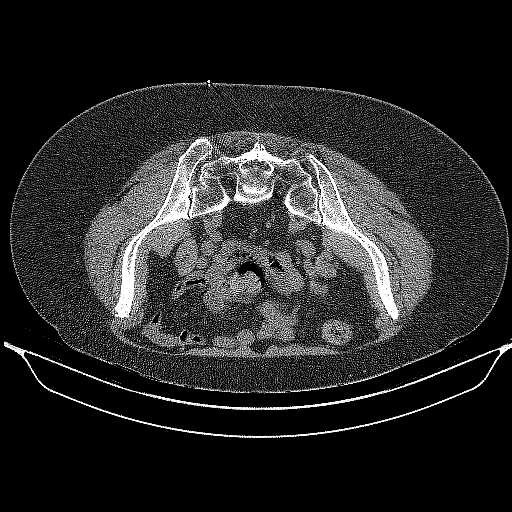
[im 19/33  soft-tissue]
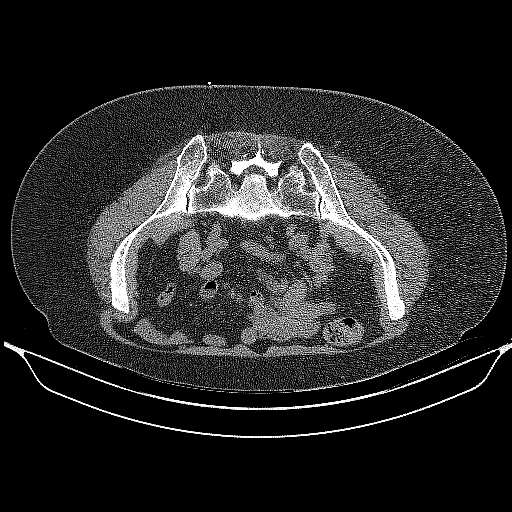
[im 23/33  soft-tissue]
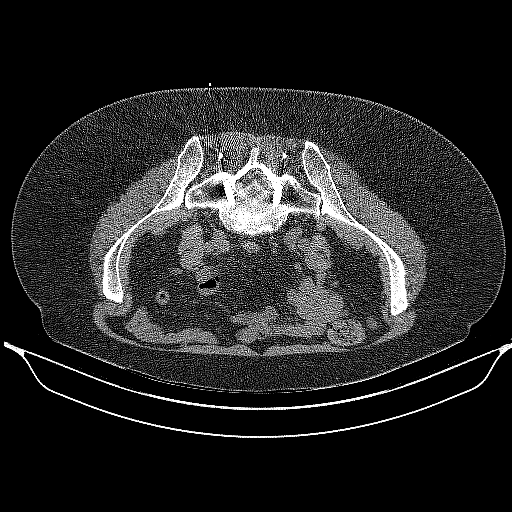
[im 28/33  soft-tissue]
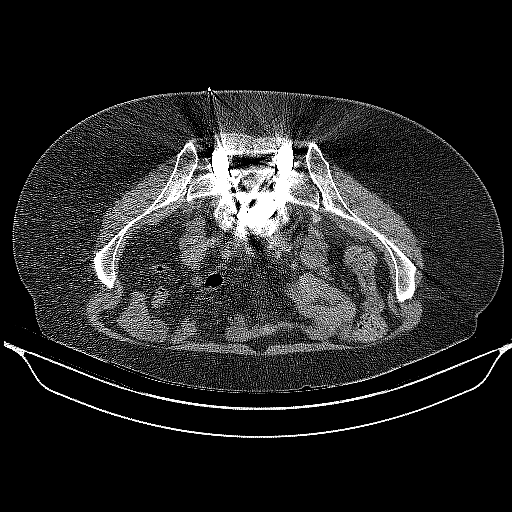

[Series 3: needle -guided injection · axial · 0.83mm/px · z∈[-88,-40]mm · 7 of 33 slices shown, 12 images (2 of 2)]
[im 5/33  soft-tissue]
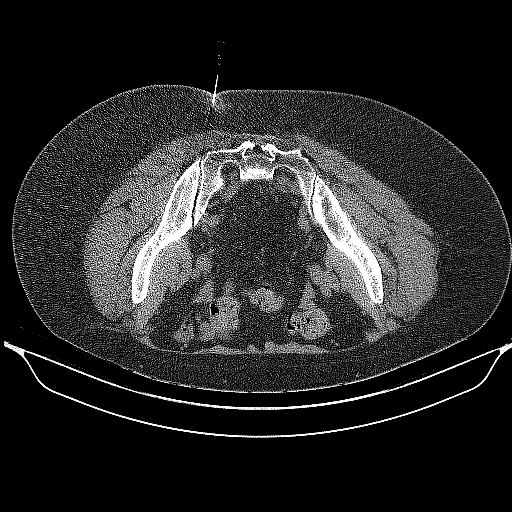
[im 5/33  bone]
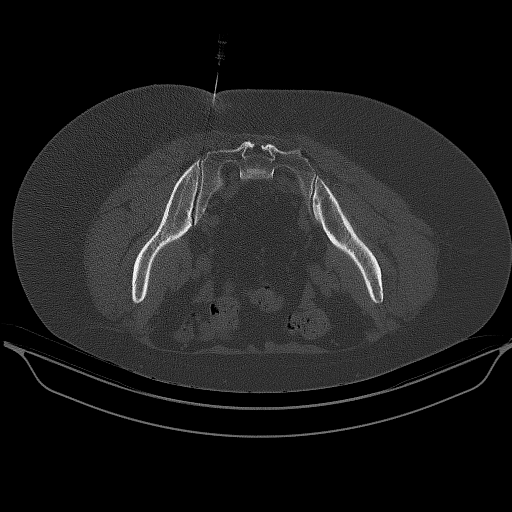
[im 9/33  soft-tissue]
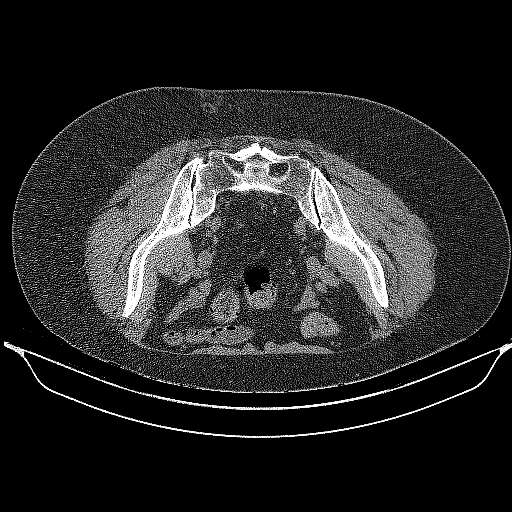
[im 13/33  soft-tissue]
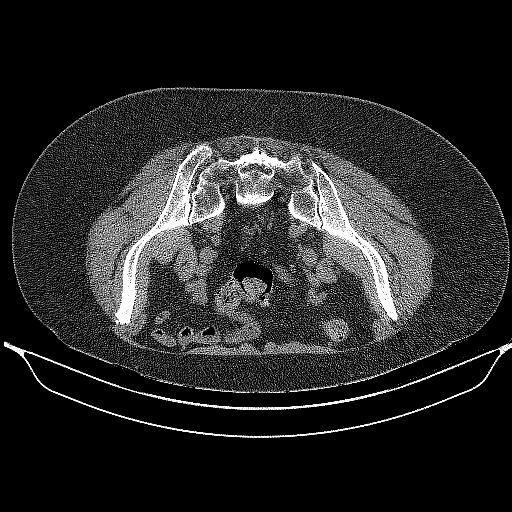
[im 17/33  soft-tissue]
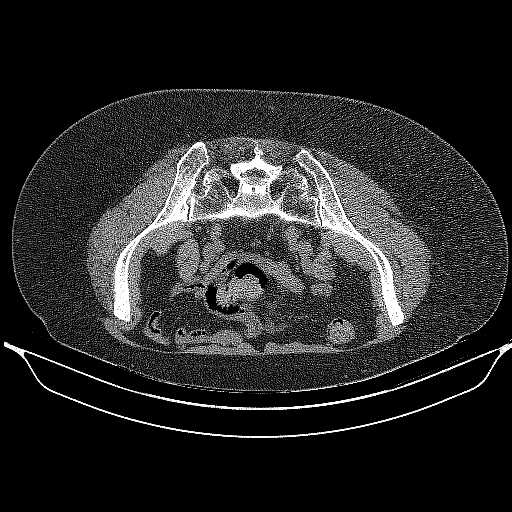
[im 17/33  lung]
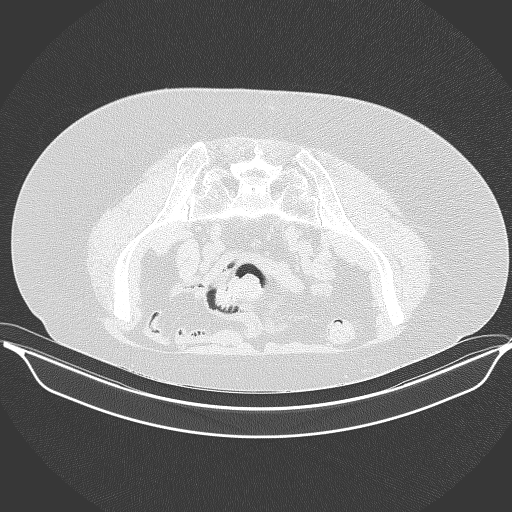
[im 21/33  soft-tissue]
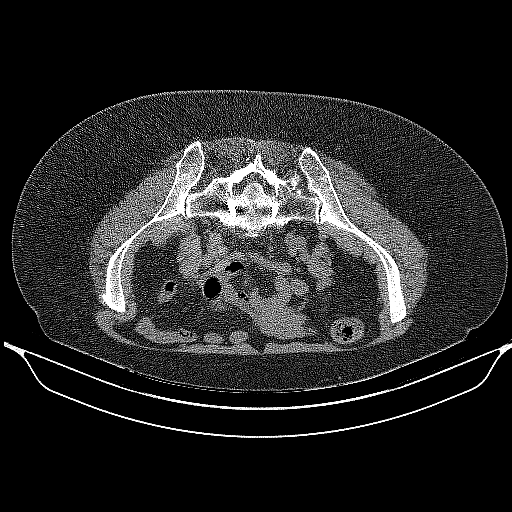
[im 21/33  lung]
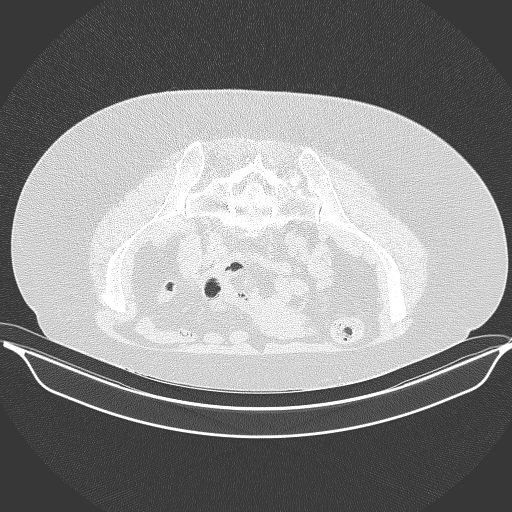
[im 25/33  soft-tissue]
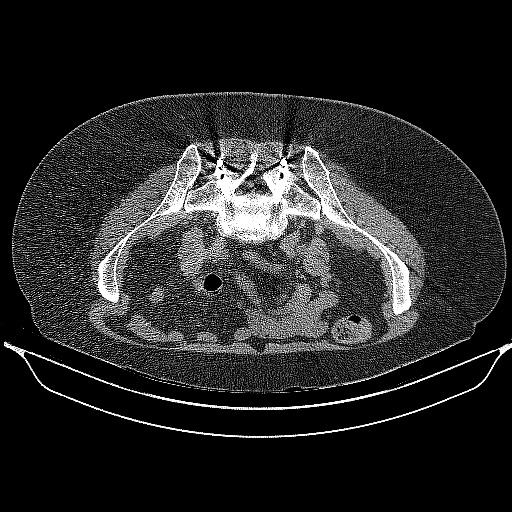
[im 25/33  lung]
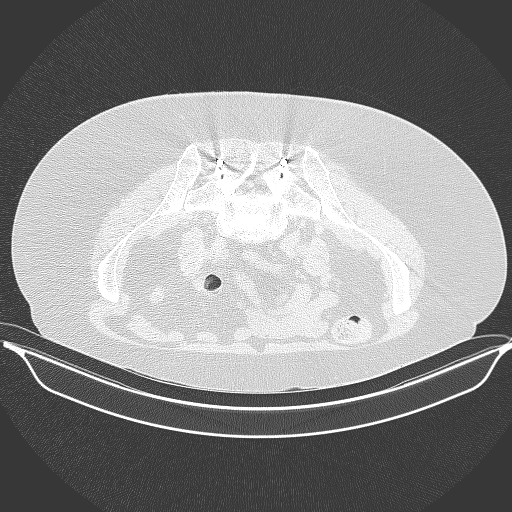
[im 29/33  soft-tissue]
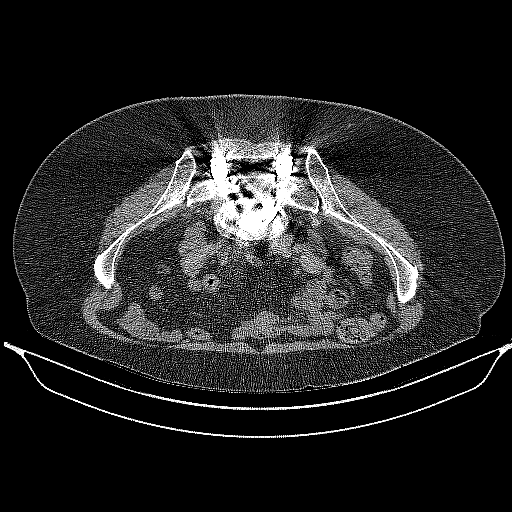
[im 29/33  lung]
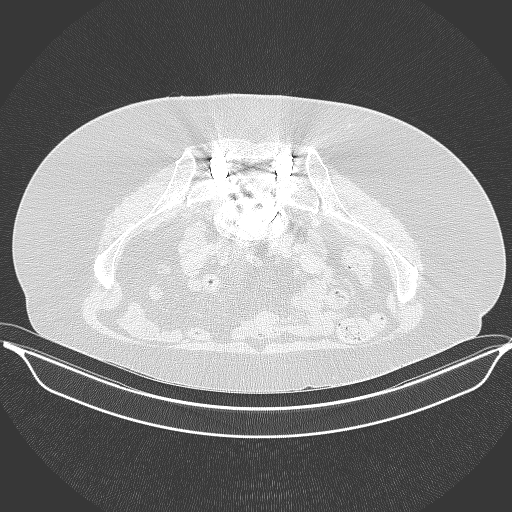

[13 of 32 positions shown; findings below may reference images not displayed]

EXAM:
Right CT GUIDED SI JOINT INJECTION



After local anesthesia with 1% lidocaine without epinephrine and
subsequent deep anesthesia, a 3.5 inch 22 gauge curved spinal needle
was advanced into the right SI joint under intermittent CT guidance.

Once the needle was in satisfactory position, a small amount of
contrast was injected and a representative image was captured with
the needle demonstrated in the sacroiliac joint with normal
distribution of contrast in the joint. Subsequently, 2 mL 0.5%
bupivacaine was injected into the right SI joint. Needles removed
and a sterile dressing applied.

No complications were observed.
IMPRESSION: Successful CT-guided right SI joint injection.

## 2021-12-02 ENCOUNTER — Telehealth: Payer: Medicare Other | Admitting: Internal Medicine

## 2021-12-24 ENCOUNTER — Encounter: Payer: Medicare Other | Admitting: Internal Medicine

## 2022-02-18 LAB — HM PAP SMEAR

## 2022-03-02 ENCOUNTER — Ambulatory Visit (INDEPENDENT_AMBULATORY_CARE_PROVIDER_SITE_OTHER): Payer: Medicare Other | Admitting: Internal Medicine

## 2022-03-02 ENCOUNTER — Encounter: Payer: Self-pay | Admitting: Internal Medicine

## 2022-03-02 VITALS — BP 130/70 | HR 78 | Temp 98.1°F | Ht 63.6 in | Wt 168.6 lb

## 2022-03-02 DIAGNOSIS — M79642 Pain in left hand: Secondary | ICD-10-CM

## 2022-03-02 DIAGNOSIS — Z23 Encounter for immunization: Secondary | ICD-10-CM | POA: Diagnosis not present

## 2022-03-02 DIAGNOSIS — R030 Elevated blood-pressure reading, without diagnosis of hypertension: Secondary | ICD-10-CM

## 2022-03-02 DIAGNOSIS — R413 Other amnesia: Secondary | ICD-10-CM | POA: Diagnosis not present

## 2022-03-02 DIAGNOSIS — M952 Other acquired deformity of head: Secondary | ICD-10-CM | POA: Diagnosis not present

## 2022-03-02 DIAGNOSIS — M79641 Pain in right hand: Secondary | ICD-10-CM | POA: Diagnosis not present

## 2022-03-02 DIAGNOSIS — Z6829 Body mass index (BMI) 29.0-29.9, adult: Secondary | ICD-10-CM

## 2022-03-02 DIAGNOSIS — E7800 Pure hypercholesterolemia, unspecified: Secondary | ICD-10-CM

## 2022-03-02 MED ORDER — PAROXETINE HCL 20 MG PO TABS
20.0000 mg | ORAL_TABLET | Freq: Every day | ORAL | 2 refills | Status: DC
Start: 2022-03-02 — End: 2023-03-30

## 2022-03-02 MED ORDER — AMOXICILLIN 500 MG PO CAPS: ORAL_CAPSULE | ORAL | 1 refills | Status: DC

## 2022-03-02 MED ORDER — ATORVASTATIN CALCIUM 10 MG PO TABS: 10.0000 mg | ORAL_TABLET | Freq: Every day | ORAL | 2 refills | Status: DC
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
## 2022-03-02 NOTE — Patient Instructions (Signed)
Tart cherry juice Magnesium glycinate  Insomnia Insomnia is a sleep disorder that makes it difficult to fall asleep or stay asleep. Insomnia can cause fatigue, low energy, difficulty concentrating, mood swings, and poor performance at work or school. There are three different ways to classify insomnia: Difficulty falling asleep. Difficulty staying asleep. Waking up too early in the morning. Any type of insomnia can be long-term (chronic) or short-term (acute). Both are common. Short-term insomnia usually lasts for 3 months or less. Chronic insomnia occurs at least three times a week for longer than 3 months. What are the causes? Insomnia may be caused by another condition, situation, or substance, such as: Having certain mental health conditions, such as anxiety and depression. Using caffeine, alcohol, tobacco, or drugs. Having gastrointestinal conditions, such as gastroesophageal reflux disease (GERD). Having certain medical conditions. These include: Asthma. Alzheimer's disease. Stroke. Chronic pain. An overactive thyroid gland (hyperthyroidism). Other sleep disorders, such as restless legs syndrome and sleep apnea. Menopause. Sometimes, the cause of insomnia may not be known. What increases the risk? Risk factors for insomnia include: Gender. Females are affected more often than males. Age. Insomnia is more common as people get older. Stress and certain medical and mental health conditions. Lack of exercise. Having an irregular work schedule. This may include working night shifts and traveling between different time zones. What are the signs or symptoms? If you have insomnia, the main symptom is having trouble falling asleep or having trouble staying asleep. This may lead to other symptoms, such as: Feeling tired or having low energy. Feeling nervous about going to sleep. Not feeling rested in the morning. Having trouble concentrating. Feeling irritable, anxious, or  depressed. How is this diagnosed? This condition may be diagnosed based on: Your symptoms and medical history. Your health care provider may ask about: Your sleep habits. Any medical conditions you have. Your mental health. A physical exam. How is this treated? Treatment for insomnia depends on the cause. Treatment may focus on treating an underlying condition that is causing the insomnia. Treatment may also include: Medicines to help you sleep. Counseling or therapy. Lifestyle adjustments to help you sleep better. Follow these instructions at home: Eating and drinking  Limit or avoid alcohol, caffeinated beverages, and products that contain nicotine and tobacco, especially close to bedtime. These can disrupt your sleep. Do not eat a large meal or eat spicy foods right before bedtime. This can lead to digestive discomfort that can make it hard for you to sleep. Sleep habits  Keep a sleep diary to help you and your health care provider figure out what could be causing your insomnia. Write down: When you sleep. When you wake up during the night. How well you sleep and how rested you feel the next day. Any side effects of medicines you are taking. What you eat and drink. Make your bedroom a dark, comfortable place where it is easy to fall asleep. Put up shades or blackout curtains to block light from outside. Use a white noise machine to block noise. Keep the temperature cool. Limit screen use before bedtime. This includes: Not watching TV. Not using your smartphone, tablet, or computer. Stick to a routine that includes going to bed and waking up at the same times every day and night. This can help you fall asleep faster. Consider making a quiet activity, such as reading, part of your nighttime routine. Try to avoid taking naps during the day so that you sleep better at night. Get out of bed  if you are still awake after 15 minutes of trying to sleep. Keep the lights down, but try  reading or doing a quiet activity. When you feel sleepy, go back to bed. General instructions Take over-the-counter and prescription medicines only as told by your health care provider. Exercise regularly as told by your health care provider. However, avoid exercising in the hours right before bedtime. Use relaxation techniques to manage stress. Ask your health care provider to suggest some techniques that may work well for you. These may include: Breathing exercises. Routines to release muscle tension. Visualizing peaceful scenes. Make sure that you drive carefully. Do not drive if you feel very sleepy. Keep all follow-up visits. This is important. Contact a health care provider if: You are tired throughout the day. You have trouble in your daily routine due to sleepiness. You continue to have sleep problems, or your sleep problems get worse. Get help right away if: You have thoughts about hurting yourself or someone else. Get help right away if you feel like you may hurt yourself or others, or have thoughts about taking your own life. Go to your nearest emergency room or: Call 911. Call the [place] at [phone] or 988. This is open 24 hours a day. Text the Crisis Text Line at (726) 279-5970. Summary Insomnia is a sleep disorder that makes it difficult to fall asleep or stay asleep. Insomnia can be long-term (chronic) or short-term (acute). Treatment for insomnia depends on the cause. Treatment may focus on treating an underlying condition that is causing the insomnia. Keep a sleep diary to help you and your health care provider figure out what could be causing your insomnia. This information is not intended to replace advice given to you by your health care provider. Make sure you discuss any questions you have with your health care provider. Document Revised: 04/21/2021 Document Reviewed: 04/21/2021 Elsevier Patient Education  [place].

## 2022-03-04 LAB — TSH: TSH: 2.7 u[IU]/mL (ref 0.450–4.500)

## 2022-03-04 LAB — CMP14+EGFR
ALT: 16 IU/L (ref 0–32)
AST: 20 IU/L (ref 0–40)
Albumin/Globulin Ratio: 1.7 (ref 1.2–2.2)
Albumin: 4.3 g/dL (ref 3.8–4.8)
Alkaline Phosphatase: 103 IU/L (ref 44–121)
BUN/Creatinine Ratio: 24 (ref 12–28)
BUN: 21 mg/dL (ref 8–27)
Bilirubin Total: <0.2 mg/dL (ref 0.0–1.2)
CO2: 22 mmol/L (ref 20–29)
Calcium: 9.2 mg/dL (ref 8.7–10.3)
Chloride: 103 mmol/L (ref 96–106)
Creatinine, Ser: 0.86 mg/dL (ref 0.57–1.00)
Globulin, Total: 2.6 g/dL (ref 1.5–4.5)
Glucose: 91 mg/dL (ref 70–99)
Potassium: 5.1 mmol/L (ref 3.5–5.2)
Sodium: 141 mmol/L (ref 134–144)
Total Protein: 6.9 g/dL (ref 6.0–8.5)
eGFR: 70 mL/min/{1.73_m2} (ref >59)

## 2022-03-04 LAB — ANTINUCLEAR ANTIBODIES, IFA: ANA Titer 1: NEGATIVE

## 2022-03-04 LAB — VITAMIN B12: Vitamin B-12: 352 pg/mL (ref 232–1245)

## 2022-03-04 LAB — CYCLIC CITRUL PEPTIDE ANTIBODY, IGG/IGA: Cyclic Citrullin Peptide Ab: 35 units — ABNORMAL HIGH (ref 0–19)

## 2022-03-04 LAB — RHEUMATOID FACTOR: Rheumatoid fact SerPl-aCnc: <10 IU/mL (ref <14.0)

## 2022-03-04 LAB — URIC ACID: Uric Acid: 4.9 mg/dL (ref 3.1–7.9)

## 2022-03-04 LAB — SEDIMENTATION RATE: Sed Rate: 12 mm/hr (ref 0–40)

## 2022-03-16 ENCOUNTER — Ambulatory Visit
Admission: RE | Admit: 2022-03-16 | Discharge: 2022-03-16 | Disposition: A | Discharge status: Home / Self Care | Payer: Medicare Other | Source: Ambulatory Visit | Attending: Internal Medicine | Admitting: Internal Medicine

## 2022-03-16 DIAGNOSIS — R413 Other amnesia: Secondary | ICD-10-CM

## 2022-03-17 ENCOUNTER — Encounter: Payer: Self-pay | Admitting: Internal Medicine

## 2022-03-27 ENCOUNTER — Other Ambulatory Visit: Payer: Self-pay | Admitting: Internal Medicine

## 2022-06-25 ENCOUNTER — Ambulatory Visit: Payer: Medicare Other | Admitting: Internal Medicine

## 2022-06-25 ENCOUNTER — Ambulatory Visit (INDEPENDENT_AMBULATORY_CARE_PROVIDER_SITE_OTHER): Payer: Medicare Other

## 2022-06-25 VITALS — Ht 64.25 in | Wt 170.0 lb

## 2022-06-25 DIAGNOSIS — Z0000 Encounter for general adult medical examination without abnormal findings: Secondary | ICD-10-CM

## 2022-06-25 NOTE — Patient Instructions (Signed)
Jenny [name] , Thank you for taking time to come for your Medicare Wellness Visit. I appreciate your ongoing commitment to your health goals. Please review the following plan we discussed and let me know if I can assist you in the future.   These are the goals we discussed:  Goals      Patient Stated     05/02/2020, wants to lose 8 pounds     Patient Stated     05/14/2021, wants to lose 5 pounds     Patient Stated     06/25/2022, wants to increase activity level        This is a list of the screening recommended for you and due dates:  Health Maintenance  Topic Date Due   Zoster (Shingles) Vaccine (1 of 2) Never done   DTaP/Tdap/Td vaccine (2 - Td or Tdap) 05/10/2021   COVID-19 Vaccine (6 - 2023-24 season) 06/02/2022   Mammogram  07/18/2022   Medicare Annual Wellness Visit  06/26/2023   Pneumonia Vaccine  Completed   Flu Shot  Completed   DEXA scan (bone density measurement)  Completed   Hepatitis C Screening: USPSTF Recommendation to screen - Ages 68-79 yo.  Completed   HPV Vaccine  Aged Out   Colon Cancer Screening  Discontinued    Advanced directives: Advance directive discussed with you today. .  Conditions/risks identified: none  Next appointment: Follow up in one year for your annual wellness visit    Preventive Care 65 Years and Older, Female Preventive care refers to lifestyle choices and visits with your health care provider that can promote health and wellness. What does preventive care include? A yearly physical exam. This is also called an annual well check. Dental exams once or twice a year. Routine eye exams. Ask your health care provider how often you should have your eyes checked. Personal lifestyle choices, including: Daily care of your teeth and gums. Regular physical activity. Eating a healthy diet. Avoiding tobacco and drug use. Limiting alcohol use. Practicing safe sex. Taking low-dose aspirin every day. Taking vitamin and mineral supplements  as recommended by your health care provider. What happens during an annual well check? The services and screenings done by your health care provider during your annual well check will depend on your age, overall health, lifestyle risk factors, and family history of disease. Counseling  Your health care provider may ask you questions about your: Alcohol use. Tobacco use. Drug use. Emotional well-being. Home and relationship well-being. Sexual activity. Eating habits. History of falls. Memory and ability to understand (cognition). Work and work Statistician. Reproductive health. Screening  You may have the following tests or measurements: Height, weight, and BMI. Blood pressure. Lipid and cholesterol levels. These may be checked every 5 years, or more frequently if you are over 75 years old. Skin check. Lung cancer screening. You may have this screening every year starting at age 69 if you have a 30-pack-year history of smoking and currently smoke or have quit within the past 15 years. Fecal occult blood test (FOBT) of the stool. You may have this test every year starting at age 40. Flexible sigmoidoscopy or colonoscopy. You may have a sigmoidoscopy every 5 years or a colonoscopy every 10 years starting at age 17. Hepatitis C blood test. Hepatitis B blood test. Sexually transmitted disease (STD) testing. Diabetes screening. This is done by checking your blood sugar (glucose) after you have not eaten for a while (fasting). You may have this done every 1-3 years.  Bone density scan. This is done to screen for osteoporosis. You may have this done starting at age 29. Mammogram. This may be done every 1-2 years. Talk to your health care provider about how often you should have regular mammograms. Talk with your health care provider about your test results, treatment options, and if necessary, the need for more tests. Vaccines  Your health care provider may recommend certain vaccines, such  as: Influenza vaccine. This is recommended every year. Tetanus, diphtheria, and acellular pertussis (Tdap, Td) vaccine. You may need a Td booster every 10 years. Zoster vaccine. You may need this after age 51. Pneumococcal 13-valent conjugate (PCV13) vaccine. One dose is recommended after age 83. Pneumococcal polysaccharide (PPSV23) vaccine. One dose is recommended after age 34. Talk to your health care provider about which screenings and vaccines you need and how often you need them. This information is not intended to replace advice given to you by your health care provider. Make sure you discuss any questions you have with your health care provider. Document Released: 06/07/2015 Document Revised: 01/29/2016 Document Reviewed: 03/12/2015 Elsevier Interactive Patient Education  2017 [place] Prevention in the Home Falls can cause injuries. They can happen to people of all ages. There are many things you can do to make your home safe and to help prevent falls. What can I do on the outside of my home? Regularly fix the edges of walkways and driveways and fix any cracks. Remove anything that might make you trip as you walk through a door, such as a raised step or threshold. Trim any bushes or trees on the path to your home. Use bright outdoor lighting. Clear any walking paths of anything that might make someone trip, such as rocks or tools. Regularly check to see if handrails are loose or broken. Make sure that both sides of any steps have handrails. Any raised decks and porches should have guardrails on the edges. Have any leaves, snow, or ice cleared regularly. Use sand or salt on walking paths during winter. Clean up any spills in your garage right away. This includes oil or grease spills. What can I do in the bathroom? Use night lights. Install grab bars by the toilet and in the tub and shower. Do not use towel bars as grab bars. Use non-skid mats or decals in the tub or  shower. If you need to sit down in the shower, use a plastic, non-slip stool. Keep the floor dry. Clean up any water that spills on the floor as soon as it happens. Remove soap buildup in the tub or shower regularly. Attach bath mats securely with double-sided non-slip rug tape. Do not have throw rugs and other things on the floor that can make you trip. What can I do in the bedroom? Use night lights. Make sure that you have a light by your bed that is easy to reach. Do not use any sheets or blankets that are too big for your bed. They should not hang down onto the floor. Have a firm chair that has side arms. You can use this for support while you get dressed. Do not have throw rugs and other things on the floor that can make you trip. What can I do in the kitchen? Clean up any spills right away. Avoid walking on wet floors. Keep items that you use a lot in easy-to-reach places. If you need to reach something above you, use a strong step stool that has a grab bar.  Keep electrical cords out of the way. Do not use floor polish or wax that makes floors slippery. If you must use wax, use non-skid floor wax. Do not have throw rugs and other things on the floor that can make you trip. What can I do with my stairs? Do not leave any items on the stairs. Make sure that there are handrails on both sides of the stairs and use them. Fix handrails that are broken or loose. Make sure that handrails are as long as the stairways. Check any carpeting to make sure that it is firmly attached to the stairs. Fix any carpet that is loose or worn. Avoid having throw rugs at the top or bottom of the stairs. If you do have throw rugs, attach them to the floor with carpet tape. Make sure that you have a light switch at the top of the stairs and the bottom of the stairs. If you do not have them, ask someone to add them for you. What else can I do to help prevent falls? Wear shoes that: Do not have high heels. Have  rubber bottoms. Are comfortable and fit you well. Are closed at the toe. Do not wear sandals. If you use a stepladder: Make sure that it is fully opened. Do not climb a closed stepladder. Make sure that both sides of the stepladder are locked into place. Ask someone to hold it for you, if possible. Clearly mark and make sure that you can see: Any grab bars or handrails. First and last steps. Where the edge of each step is. Use tools that help you move around (mobility aids) if they are needed. These include: Canes. Walkers. Scooters. Crutches. Turn on the lights when you go into a dark area. Replace any light bulbs as soon as they burn out. Set up your furniture so you have a clear path. Avoid moving your furniture around. If any of your floors are uneven, fix them. If there are any pets around you, be aware of where they are. Review your medicines with your doctor. Some medicines can make you feel dizzy. This can increase your chance of falling. Ask your doctor what other things that you can do to help prevent falls. This information is not intended to replace advice given to you by your health care provider. Make sure you discuss any questions you have with your health care provider. Document Released: 03/07/2009 Document Revised: 10/17/2015 Document Reviewed: 06/15/2014 Elsevier Interactive Patient Education  2017 [employer].

## 2022-06-25 NOTE — Progress Notes (Signed)
I connected with Jenny [name] today by telephone and verified that I am speaking with the correct person using two identifiers. Location patient: home Location provider: work Persons participating in the virtual visit: Kelani, Robart LPN.   I discussed the limitations, risks, security and privacy concerns of performing an evaluation and management service by telephone and the availability of in person appointments. I also discussed with the patient that there may be a patient responsible charge related to this service. The patient expressed understanding and verbally consented to this telephonic visit.    Interactive audio and video telecommunications were attempted between this provider and patient, however failed, due to patient having technical difficulties OR patient did not have access to video capability.  We continued and completed visit with audio only.     Vital signs may be patient reported or missing.  Subjective:   Jenny [name] is a 77 y.o. female who presents for Medicare Annual (Subsequent) preventive examination.  Review of Systems     Cardiac Risk Factors include: advanced age (>73mn, >>57women);dyslipidemia     Objective:    Today's Vitals   06/25/22 0956  Weight: 170 lb (77.1 kg)  Height: 5' 4.25" (1.632 m)   Body mass index is 28.95 kg/m.     06/25/2022   10:02 AM 05/14/2021   10:01 AM 11/06/2020    3:16 PM 05/02/2020   11:40 AM 04/11/2020    2:15 PM 04/09/2020   11:17 AM  Advanced Directives  Does Patient Have a Medical Advance Directive? No No No No No No  Would patient like information on creating a medical advance directive?   Yes (MAU/Ambulatory/Procedural Areas - Information given)  No - Patient declined No - Patient declined    Current Medications (verified) Outpatient Encounter Medications as of 06/25/2022  Medication Sig   Alpha-D-Galactosidase (BEANO PO) Take by mouth. prn   amoxicillin (AMOXIL) 500 MG capsule Take 4  caps 1 hour prior to dental procedure   atorvastatin (LIPITOR) 10 MG tablet Take 1 tablet (10 mg total) by mouth daily.   Calcium-Vitamin D-Vitamin K (CALCIUM SOFT CHEWS PO) Take by mouth.   Cholecalciferol (VITAMIN D-3) 25 MCG (1000 UT) CAPS Take by mouth.   DENTA 5000 PLUS 1.1 % CREA dental cream PLACE 1 APPLICATION ONTO TEETH AT BEDTIME AS DIRECTED   diphenhydrAMINE (BENADRYL) 25 mg capsule Take 25 mg by mouth at bedtime as needed for sleep.    Lactase (LACTAID PO) Take by mouth. prn   PARoxetine (PAXIL) 20 MG tablet Take 1 tablet (20 mg total) by mouth daily.   No facility-administered encounter medications on file as of 06/25/2022.    Allergies (verified) Clindamycin, Prochlorperazine, Ibandronic acid, Raloxifene, Diclofenac sodium, and Penicillin v potassium   History: Past Medical History:  Diagnosis Date   Anxiety    Arthritis    generalized   Bursitis of both hips    [place]   Melanoma [place] Arm   Cataract 2016,2017   Chronic kidney disease    Depression    Hyperlipidemia    Hypertension    [place]    Peripheral Nueropathy ( Left Side)   Plantar fasciitis    PONV (postoperative nausea and vomiting)    2014 (per pt, hard time waking up)   Preop cardiovascular exam 12/17/2016   Sleep apnea    Past Surgical History:  Procedure Laterality Date   BACK SURGERY  05/04/2013   Cervical Discectomy with Fusion by Dr.McGovern  BREAST SURGERY  1980   fibroadenoma   CATARACT EXTRACTION Left 06/14/2015   CATARACT EXTRACTION Right 05/08/2015   CATARACT EXTRACTION, BILATERAL  2017   CERVICAL SPINE SURGERY  2014   spinal fusion   MELANOMA EXCISION Left 2010   left arm   TONSILLECTOMY     removed at age 77   TRANSFORAMINAL LUMBAR INTERBODY FUSION (TLIF) WITH PEDICLE SCREW FIXATION 1 LEVEL N/A 04/11/2020   Procedure: TRANSFORAMINAL LUMBAR INTERBODY FUSION (TLIF) L5-S1, L4-5 LEFT LAMINOTOMY/ DECOMPRESSION;  Surgeon: Melina Schools, MD;  Location:  [facility];  Service: Orthopedics;  Laterality: N/A;  5 hrs   TUBAL LIGATION     Family History  Problem Relation Age of Onset   COPD Mother    Psychosis Mother    COPD Father    Depression Father    Hypertension Father    Lupus Daughter    Depression Paternal Aunt    Heart disease Paternal Grandmother    Social History   Socioeconomic History   Marital status: Married    Spouse name: Not on file   Number of children: 2   Years of education: Not on file   Highest education level: Not on file  Occupational History   Not on file  Tobacco Use   Smoking status: Former    Packs/day: 0.25    Years: 2.00    Total pack years: 0.50    Types: Cigarettes    Quit date: 09/26/1968    Years since quitting: 53.7   Smokeless tobacco: Never  Vaping Use   Vaping Use: Never used  Substance and Sexual Activity   Alcohol use: Yes    Alcohol/week: 2.0 standard drinks of alcohol    Types: 1 Glasses of wine, 1 Standard drinks or equivalent per week    Comment: Once a Week   Drug use: Never   Sexual activity: Yes    Birth control/protection: Post-menopausal  Other Topics Concern   Not on file  Social History Narrative   Not on file   Social Determinants of Health   Financial Resource Strain: Low Risk  (06/25/2022)   Overall Financial Resource Strain (CARDIA)    Difficulty of Paying Living Expenses: Not hard at all  Food Insecurity: No Food Insecurity (06/25/2022)   Hunger Vital Sign    Worried About Running Out of Food in the Last Year: Never true    Ran Out of Food in the Last Year: Never true  Transportation Needs: No Transportation Needs (06/25/2022)   PRAPARE - Hydrologist (Medical): No    Lack of Transportation (Non-Medical): No  Physical Activity: Insufficiently Active (06/25/2022)   Exercise Vital Sign    Days of Exercise per Week: 7 days    Minutes of Exercise per Session: 20 min  Stress: No Stress Concern Present (06/25/2022)   [place]    Feeling of Stress : Not at all  Social Connections: Not on file    Tobacco Counseling Counseling given: Not Answered   Clinical Intake:  Pre-visit preparation completed: Yes  Pain : No/denies pain     Nutritional Status: BMI 25 -29 Overweight Nutritional Risks: None Diabetes: No  How often do you need to have someone help you when you read instructions, pamphlets, or other written materials from your doctor or pharmacy?: 1 - Never  Diabetic? no  Interpreter Needed?: No  Information entered by :: NAllen LPN   Activities of Daily  Living    06/25/2022   10:07 AM  In your present state of health, do you have any difficulty performing the following activities:  Hearing? 0  Vision? 0  Difficulty concentrating or making decisions? 1  Walking or climbing stairs? 0  Dressing or bathing? 0  Doing errands, shopping? 0  Preparing Food and eating ? N  Using the Toilet? N  In the past six months, have you accidently leaked urine? Y  Do you have problems with loss of bowel control? N  Managing your Medications? N  Managing your Finances? N  Housekeeping or managing your Housekeeping? N    Patient Care Team: Glendale Chard, MD as PCP - General (Internal Medicine)  Indicate any recent Medical Services you may have received from other than [facility] providers in the past year (date may be approximate).     Assessment:   This is a routine wellness examination for [place].  Hearing/Vision screen Vision Screening - Comments:: Regular eye exams,   Dietary issues and exercise activities discussed: Current Exercise Habits: Home exercise routine, Type of exercise: Other - see comments (recumbent bike), Time (Minutes): 30, Frequency (Times/Week): 7, Weekly Exercise (Minutes/Week): 210   Goals Addressed             This Visit's Progress    Patient Stated       06/25/2022, wants to increase activity level        Depression Screen    06/25/2022   10:04 AM 05/14/2021   10:03 AM 11/28/2020   12:35 PM 05/02/2020   11:43 AM 02/20/2020    2:06 PM 09/27/2019   11:21 AM 09/27/2019   11:12 AM  PHQ 2/9 Scores  PHQ - 2 Score 5 0 0 0 0 0 0  PHQ- 9 Score 10    3 0     Fall Risk    06/25/2022   10:03 AM 05/14/2021   10:02 AM 05/02/2020   11:41 AM 09/27/2019   11:11 AM  Fall Risk   Falls in the past year? 1 0 1 1  Comment fell off the porch  legs gave out Patient fell out of chair with wheels  Number falls in past yr: 0  0 0  Injury with Fall? 0  0 1  Comment    Jammed Thumb  Risk for fall due to : Medication side effect Medication side effect Medication side effect   Follow up Falls prevention discussed;Education provided;Falls evaluation completed Falls evaluation completed;Education provided;Falls prevention discussed Falls evaluation completed;Education provided;Falls prevention discussed     FALL RISK PREVENTION PERTAINING TO THE HOME:  Any stairs in or around the home? Yes  If so, are there any without handrails? No  Home free of loose throw rugs in walkways, pet beds, electrical cords, etc? Yes  Adequate lighting in your home to reduce risk of falls? Yes   ASSISTIVE DEVICES UTILIZED TO PREVENT FALLS:  Life alert? No  Use of a cane, walker or w/c? No  Grab bars in the bathroom? Yes  Shower chair or bench in shower? Yes  Elevated toilet seat or a handicapped toilet? No   TIMED UP AND GO:  Was the test performed? No .       Cognitive Function:        06/25/2022   10:10 AM 03/02/2022    3:29 PM 05/14/2021   10:06 AM 05/02/2020   11:45 AM  6CIT Screen  What Year? 0 points 0 points 0 points 0 points  What month? 0 points 0 points 0 points 0 points  What time? 3 points 0 points 0 points 0 points  Count back from 20 0 points 0 points 0 points 0 points  Months in reverse 0 points 0 points 0 points 0 points  Repeat phrase 0 points 0 points 0 points 2 points  Total Score 3 points 0 points  0 points 2 points    Immunizations Immunization History  Administered Date(s) Administered   Fluad Quad(high Dose 65+) 03/02/2022   Influenza Split 03/28/2010, 02/10/2011, 05/11/2012   Influenza, High Dose Seasonal PF 02/05/2016, 04/06/2017, 03/16/2018   Influenza, Quadrivalent, Recombinant, Inj, Pf 03/20/2019   Influenza, Seasonal, Injecte, Preservative Fre 04/04/2013   Influenza,inj,Quad PF,6+ Mos 04/11/2014, 03/15/2015   PFIZER(Purple Top)SARS-COV-2 Vaccination 07/24/2019, 08/14/2019, 05/14/2020   Pfizer Covid-19 Vaccine Bivalent Booster 74yr & up 03/17/2021, 04/07/2022   Pneumococcal Conjugate-13 07/13/2014   Pneumococcal Polysaccharide-23 02/05/2016   Pneumococcal-Unspecified 03/30/2008   Tdap 05/11/2011   Zoster, Live 04/08/2016    TDAP status: Due, Education has been provided regarding the importance of this vaccine. Advised may receive this vaccine at local pharmacy or Health Dept. Aware to provide a copy of the vaccination record if obtained from local pharmacy or Health Dept. Verbalized acceptance and understanding.  Flu Vaccine status: Up to date  Pneumococcal vaccine status: Up to date  Covid-19 vaccine status: Completed vaccines  Qualifies for Shingles Vaccine? Yes   Zostavax completed Yes   Shingrix Completed?: No.    Education has been provided regarding the importance of this vaccine. Patient has been advised to call insurance company to determine out of pocket expense if they have not yet received this vaccine. Advised may also receive vaccine at local pharmacy or Health Dept. Verbalized acceptance and understanding.  Screening Tests Health Maintenance  Topic Date Due   Zoster Vaccines- Shingrix (1 of 2) Never done   DTaP/Tdap/Td (2 - Td or Tdap) 05/10/2021   Medicare Annual Wellness (AWV)  05/14/2022   COVID-19 Vaccine (6 - 2023-24 season) 06/02/2022   MAMMOGRAM  07/18/2022   Pneumonia Vaccine 77 Years old  Completed   INFLUENZA VACCINE  Completed   DEXA  SCAN  Completed   Hepatitis C Screening  Completed   HPV VACCINES  Aged Out   COLONOSCOPY (Pts [phone] coverage will need to be confirmed)  DiTildenaintenance Due  Topic Date Due   Zoster Vaccines- Shingrix (1 of 2) Never done   DTaP/Tdap/Td (2 - Td or Tdap) 05/10/2021   Medicare Annual Wellness (AWV)  05/14/2022   COVID-19 Vaccine (6 - 2023-24 season) 06/02/2022    Colorectal cancer screening: No longer required.   Mammogram status: Completed 07/18/2021. Repeat every 2 years  Bone Density status: Completed 06/16/2010.   Lung Cancer Screening: (Low Dose CT Chest recommended if Age 77-80ears, 30 pack-year currently smoking OR have quit w/in 15years.) does not qualify.   Lung Cancer Screening Referral: no  Additional Screening:  Hepatitis C Screening: does qualify; Completed 05/02/2020  Vision Screening: Recommended annual ophthalmology exams for early detection of glaucoma and other disorders of the eye. Is the patient up to date with their annual eye exam?  Yes  Who is the provider or what is the name of the office in which the patient attends annual eye exams? In [place] If pt is not established with a provider, would they like to be referred to a provider to establish care? No .   Dental  Screening: Recommended annual dental exams for proper oral hygiene  Community Resource Referral / Chronic Care Management: CRR required this visit?  No   CCM required this visit?  No      Plan:     I have personally reviewed and noted the following in the patient's chart:   Medical and social history Use of alcohol, tobacco or illicit drugs  Current medications and supplements including opioid prescriptions. Patient is not currently taking opioid prescriptions. Functional ability and status Nutritional status Physical activity Advanced directives List of other physicians Hospitalizations, surgeries, and ER visits in previous 12  months Vitals Screenings to include cognitive, depression, and falls Referrals and appointments  In addition, I have reviewed and discussed with patient certain preventive protocols, quality metrics, and best practice recommendations. A written personalized care plan for preventive services as well as general preventive health recommendations were provided to patient.     Kellie Simmering, LPN   0/07/1592   Nurse Notes: none  Due to this being a virtual visit, the after visit summary with patients personalized plan was offered to patient via mail or my-chart.  Patient would like to access on my-chart

## 2022-09-30 ENCOUNTER — Ambulatory Visit (INDEPENDENT_AMBULATORY_CARE_PROVIDER_SITE_OTHER): Payer: Medicare Other | Admitting: Internal Medicine

## 2022-09-30 ENCOUNTER — Encounter: Payer: Self-pay | Admitting: Internal Medicine

## 2022-09-30 VITALS — BP 104/72 | HR 73 | Temp 98.1°F | Ht 64.0 in | Wt 173.8 lb

## 2022-09-30 DIAGNOSIS — N182 Chronic kidney disease, stage 2 (mild): Secondary | ICD-10-CM

## 2022-09-30 DIAGNOSIS — E7800 Pure hypercholesterolemia, unspecified: Secondary | ICD-10-CM

## 2022-09-30 DIAGNOSIS — M79641 Pain in right hand: Secondary | ICD-10-CM | POA: Diagnosis not present

## 2022-09-30 DIAGNOSIS — Z6829 Body mass index (BMI) 29.0-29.9, adult: Secondary | ICD-10-CM

## 2022-09-30 DIAGNOSIS — R350 Frequency of micturition: Secondary | ICD-10-CM | POA: Diagnosis not present

## 2022-09-30 DIAGNOSIS — R42 Dizziness and giddiness: Secondary | ICD-10-CM

## 2022-09-30 DIAGNOSIS — R7303 Prediabetes: Secondary | ICD-10-CM

## 2022-09-30 DIAGNOSIS — M79642 Pain in left hand: Secondary | ICD-10-CM | POA: Diagnosis not present

## 2022-09-30 DIAGNOSIS — Z23 Encounter for immunization: Secondary | ICD-10-CM

## 2022-09-30 DIAGNOSIS — R413 Other amnesia: Secondary | ICD-10-CM

## 2022-09-30 DIAGNOSIS — E2839 Other primary ovarian failure: Secondary | ICD-10-CM

## 2022-09-30 DIAGNOSIS — E663 Overweight: Secondary | ICD-10-CM

## 2022-09-30 DIAGNOSIS — Z1231 Encounter for screening mammogram for malignant neoplasm of breast: Secondary | ICD-10-CM

## 2022-09-30 LAB — POCT URINALYSIS DIPSTICK
Bilirubin, UA: NEGATIVE
Blood, UA: NEGATIVE
Glucose, UA: POSITIVE — AB
Ketones, UA: NEGATIVE
Leukocytes, UA: NEGATIVE
Nitrite, UA: NEGATIVE
Protein, UA: NEGATIVE
Spec Grav, UA: 1.025 (ref 1.010–1.025)
Urobilinogen, UA: 0.2 E.U./dL
pH, UA: 6 (ref 5.0–8.0)

## 2022-09-30 NOTE — Progress Notes (Signed)
I,Jenny [name],acting as a scribe for Jenny Aliment, MD.,have documented all relevant documentation on the behalf of Jenny Aliment, MD,as directed by  Jenny Aliment, MD while in the presence of Jenny Aliment, MD.    Subjective:     Patient ID: Jenny [name] , female    DOB: 02/27/1946 , 77 y.o.   MRN: [id number]   Chief Complaint  Patient presents with   Hyperlipidemia    HPI  Pt presents today for chol f/u. She reports compliance with atorvastatin.  She has not had any issues with the medication. She denies myalgias, headaches, chest pain and shortness of breath.   She reports experiencing vertigo for the last couple of months which comes & goes. She does have h/o vertigo. Sx have increased in frequency the last several weeks. Denies fever/chills. Denies recent URI.  Earlier today, while taking a shower, she felt the room was spinning. She is not sure what could have triggered her sx.   She also reports noticing frequent urination. She denies having any dysuria. Her sx started about 2 days ago. Again, no fever/chills.   Lastly, she c/o hand pain. She takes Tylenol prn at home. She admits this does help, she wants to know if she can do anything differently.  Denies h/o RA.  [facility] rejected for tdap.   Hyperlipidemia This is a chronic problem. The current episode started more than 1 year ago. The problem is controlled. She has no history of chronic renal disease or hypothyroidism. Pertinent negatives include no chest pain, focal weakness or shortness of breath. Current antihyperlipidemic treatment includes statins. The current treatment provides moderate improvement of lipids. Risk factors for coronary artery disease include dyslipidemia and post-menopausal.     Past Medical History:  Diagnosis Date   Anxiety    Arthritis    generalized   Bursitis of both hips    Cancer (HCC) 2010   Melanoma Left Arm   Cataract 2016,2017   Chronic kidney disease     Depression    Hyperlipidemia    Hypertension    Neuromuscular disorder (HCC)    Peripheral Nueropathy ( Left Side)   Plantar fasciitis    PONV (postoperative nausea and vomiting)    2014 (per pt, hard time waking up)   Preop cardiovascular exam 12/17/2016   Sleep apnea      Family History  Problem Relation Age of Onset   COPD Mother    Psychosis Mother    COPD Father    Depression Father    Hypertension Father    Lupus Daughter    Depression Paternal Aunt    Heart disease Paternal Grandmother      Current Outpatient Medications:    Alpha-D-Galactosidase (BEANO PO), Take by mouth. prn, Disp: , Rfl:    atorvastatin (LIPITOR) 10 MG tablet, Take 1 tablet (10 mg total) by mouth daily., Disp: 90 tablet, Rfl: 2   Calcium-Vitamin D-Vitamin K (CALCIUM SOFT CHEWS PO), Take by mouth., Disp: , Rfl:    Cholecalciferol (VITAMIN D-3) 25 MCG (1000 UT) CAPS, Take by mouth., Disp: , Rfl:    DENTA 5000 PLUS 1.1 % CREA dental cream, PLACE 1 APPLICATION ONTO TEETH AT BEDTIME AS DIRECTED, Disp: 51 g, Rfl: 3   diphenhydrAMINE (BENADRYL) 25 mg capsule, Take 25 mg by mouth at bedtime as needed for sleep. , Disp: , Rfl:    Lactase (LACTAID PO), Take by mouth. prn, Disp: , Rfl:    PARoxetine (PAXIL) 20 MG tablet,  Take 1 tablet (20 mg total) by mouth daily., Disp: 90 tablet, Rfl: 2   amoxicillin (AMOXIL) 500 MG capsule, Take 4 caps 1 hour prior to dental procedure, Disp: 8 capsule, Rfl: 1   Allergies  Allergen Reactions   Clindamycin Hives, Itching and Rash   Prochlorperazine Palpitations    Rapid heart rate, insomnia, made her "super hyper"   Ibandronic Acid     Lower Jaw Pain   Raloxifene     Lower Jaw Pain   Diclofenac Sodium Other (See Comments)    Turns feces white   Penicillin V Potassium Rash     Review of Systems  Constitutional: Negative.   Respiratory: Negative.  Negative for shortness of breath.   Cardiovascular: Negative.  Negative for chest pain.  Genitourinary:  Positive for  frequency.  Musculoskeletal:  Positive for arthralgias.        She adds, having b/l hand pain. She takes Tylenol at home. She states her sx have increased both in frequency and intensity.  She admits this does help, she wants to know if she can do anything differently.    Neurological: Negative.  Negative for focal weakness.  Psychiatric/Behavioral: Negative.       Today's Vitals   09/30/22 0929  BP: 104/72  Pulse: 73  Temp: 98.1 F (36.7 C)  SpO2: 98%  Weight: 173 lb 12.8 oz (78.8 kg)  Height: 5\' 4"  (1.626 m)   No data found.  Body mass index is 29.83 kg/m.  Wt Readings from Last 3 Encounters:  09/30/22 173 lb 12.8 oz (78.8 kg)  06/25/22 170 lb (77.1 kg)  03/02/22 168 lb 9.6 oz (76.5 kg)     Objective:  Physical Exam Vitals and nursing note reviewed.  Constitutional:      Appearance: Normal appearance.  HENT:     Head: Normocephalic and atraumatic.  Eyes:     Extraocular Movements: Extraocular movements intact.  Cardiovascular:     Rate and Rhythm: Normal rate and regular rhythm.     Heart sounds: Normal heart sounds.  Pulmonary:     Effort: Pulmonary effort is normal.     Breath sounds: Normal breath sounds.  Musculoskeletal:     Cervical back: Normal range of motion.     Comments: Pos squeeze test on the right  Skin:    General: Skin is warm.  Neurological:     General: No focal deficit present.     Mental Status: She is alert.  Psychiatric:        Mood and Affect: Mood normal.        Behavior: Behavior normal.         Assessment And Plan:     1. Pure hypercholesterolemia Comments: Chronic, she wil c/w atorvastatin 10mg  daily.  She is not experiencing any side effects from the medication. - CMP14+EGFR - Lipid panel  2. Dizziness Comments: Intermittent, sx suggestive of vertigo. She was encouraged to stay well hydrated.  She will let me know if her sx persist.  3. Bilateral hand pain Comments: Previous labs reviewed, she had POS anti-CCP in the  past. She now agrees to Rheumatology evaluation. Referral placed. - Ambulatory referral to Rheumatology  4. Urinary frequency Comments: Chronic, intermittent. I will check urinalysis today and treat as indicated. - POCT Urinalysis Dipstick (81002)  5. Prediabetes Comments: Her a1c has been elevated in the past. I will recheck this today. She is reminded to decrease her intake of sugary beverages/foods. - Hemoglobin A1c  6. Chronic  kidney disease, stage II (mild) Comments: Chronic, she is reminded to avoid NSAIDs and stay well hydrated to decrease risk of CKD progression.  7. Memory changes Comments: CT brain results from Oct 2023 reviewed. Advised to decrease her sugar intake. Vitamin B12 and TSH have been normal.  8. Decreased estrogen level Comments: She agrees to having dexa scan. She plans to have this performed in [place]. - DG Bone Density; Future  9. Overweight with body mass index (BMI) of 29 to 29.9 in adult Comments: She is encouraged to aim for at least 150 minutes of exercise/week.  10. Immunization due Comments: I will send rx Tdap to pharmacy. Declined by [facility].  11. Encounter for screening mammogram for breast cancer - MM Digital Screening; Future   Patient was given opportunity to ask questions. Patient verbalized understanding of the plan and was able to repeat key elements of the plan. All questions were answered to their satisfaction.   I, Jenny Aliment, MD, have reviewed all documentation for this visit. The documentation on 10/03/22 for the exam, diagnosis, procedures, and orders are all accurate and complete.   IF YOU HAVE BEEN REFERRED TO A SPECIALIST, IT MAY TAKE 1-2 WEEKS TO SCHEDULE/PROCESS THE REFERRAL. IF YOU HAVE NOT HEARD FROM US/SPECIALIST IN TWO WEEKS, PLEASE GIVE [place] A CALL AT [phone].   THE PATIENT IS ENCOURAGED TO PRACTICE SOCIAL DISTANCING DUE TO THE COVID-19 PANDEMIC.

## 2022-09-30 NOTE — Patient Instructions (Addendum)
Cholesterol Content in Foods ?Cholesterol is a waxy, fat-like substance that helps to carry fat in the blood. The body needs cholesterol in small amounts, but too much cholesterol can cause damage to the arteries and heart. ?What foods have cholesterol? ? ?Cholesterol is found in animal-based foods, such as meat, seafood, and dairy. Generally, low-fat dairy and lean meats have less cholesterol than full-fat dairy and fatty meats. The milligrams of cholesterol per serving (mg per serving) of common cholesterol-containing foods are listed below. ?Meats and other proteins ?Egg -- one large whole egg has 186 mg. ?Veal shank -- 4 oz (113 g) has 141 mg. ?Lean ground turkey (93% lean) -- 4 oz (113 g) has 118 mg. ?Fat-trimmed lamb loin -- 4 oz (113 g) has 106 mg. ?Lean ground beef (90% lean) -- 4 oz (113 g) has 100 mg. ?Lobster -- 3.5 oz (99 g) has 90 mg. ?Pork loin chops -- 4 oz (113 g) has 86 mg. ?Canned salmon -- 3.5 oz (99 g) has 83 mg. ?Fat-trimmed beef top loin -- 4 oz (113 g) has 78 mg. ?Frankfurter -- 1 frank (3.5 oz or 99 g) has 77 mg. ?Crab -- 3.5 oz (99 g) has 71 mg. ?Roasted chicken without skin, white meat -- 4 oz (113 g) has 66 mg. ?Light bologna -- 2 oz (57 g) has 45 mg. ?Deli-cut turkey -- 2 oz (57 g) has 31 mg. ?Canned tuna -- 3.5 oz (99 g) has 31 mg. ?Bacon -- 1 oz (28 g) has 29 mg. ?Oysters and mussels (raw) -- 3.5 oz (99 g) has 25 mg. ?Mackerel -- 1 oz (28 g) has 22 mg. ?Trout -- 1 oz (28 g) has 20 mg. ?Pork sausage -- 1 link (1 oz or 28 g) has 17 mg. ?Salmon -- 1 oz (28 g) has 16 mg. ?Tilapia -- 1 oz (28 g) has 14 mg. ?Dairy ?Soft-serve ice cream -- ? cup (4 oz or 86 g) has 103 mg. ?Whole-milk yogurt -- 1 cup (8 oz or 245 g) has 29 mg. ?Cheddar cheese -- 1 oz (28 g) has 28 mg. ?American cheese -- 1 oz (28 g) has 28 mg. ?Whole milk -- 1 cup (8 oz or 250 mL) has 23 mg. ?2% milk -- 1 cup (8 oz or 250 mL) has 18 mg. ?Cream cheese -- 1 tablespoon (Tbsp) (14.5 g) has 15 mg. ?Cottage cheese -- ? cup (4 oz or  113 g) has 14 mg. ?Low-fat (1%) milk -- 1 cup (8 oz or 250 mL) has 10 mg. ?Sour cream -- 1 Tbsp (12 g) has 8.5 mg. ?Low-fat yogurt -- 1 cup (8 oz or 245 g) has 8 mg. ?Nonfat Greek yogurt -- 1 cup (8 oz or 228 g) has 7 mg. ?Half-and-half cream -- 1 Tbsp (15 mL) has 5 mg. ?Fats and oils ?Cod liver oil -- 1 tablespoon (Tbsp) (13.6 g) has 82 mg. ?Butter -- 1 Tbsp (14 g) has 15 mg. ?Lard -- 1 Tbsp (12.8 g) has 14 mg. ?Bacon grease -- 1 Tbsp (12.9 g) has 14 mg. ?Mayonnaise -- 1 Tbsp (13.8 g) has 5-10 mg. ?Margarine -- 1 Tbsp (14 g) has 3-10 mg. ?The items listed above may not be a complete list of foods with cholesterol. Exact amounts of cholesterol in these foods may vary depending on specific ingredients and brands. Contact a dietitian for more information. ?What foods do not have cholesterol? ?Most plant-based foods do not have cholesterol unless you combine them with a food that has   cholesterol. Foods without cholesterol include: ?Grains and cereals. ?Vegetables. ?Fruits. ?Vegetable oils, such as olive, canola, and sunflower oil. ?Legumes, such as peas, beans, and lentils. ?Nuts and seeds. ?Egg whites. ?The items listed above may not be a complete list of foods that do not have cholesterol. Contact a dietitian for more information. ?Summary ?The body needs cholesterol in small amounts, but too much cholesterol can cause damage to the arteries and heart. ?Cholesterol is found in animal-based foods, such as meat, seafood, and dairy. Generally, low-fat dairy and lean meats have less cholesterol than full-fat dairy and fatty meats. ?This information is not intended to replace advice given to you by your health care provider. Make sure you discuss any questions you have with your health care provider. ?Document Revised: 09/20/2020 Document Reviewed: 09/20/2020 ?Elsevier Patient Education ? 2023 Elsevier Inc. ? ?

## 2022-10-01 LAB — HEMOGLOBIN A1C
Est. average glucose Bld gHb Est-mCnc: 123 mg/dL
Hgb A1c MFr Bld: 5.9 % — ABNORMAL HIGH (ref 4.8–5.6)

## 2022-10-01 LAB — CMP14+EGFR
ALT: 14 IU/L (ref 0–32)
AST: 18 IU/L (ref 0–40)
Albumin/Globulin Ratio: 1.8 (ref 1.2–2.2)
Albumin: 4.3 g/dL (ref 3.8–4.8)
Alkaline Phosphatase: 83 IU/L (ref 44–121)
BUN/Creatinine Ratio: 25 (ref 12–28)
BUN: 25 mg/dL (ref 8–27)
Bilirubin Total: 0.3 mg/dL (ref 0.0–1.2)
CO2: 23 mmol/L (ref 20–29)
Calcium: 9.4 mg/dL (ref 8.7–10.3)
Chloride: 104 mmol/L (ref 96–106)
Creatinine, Ser: 1 mg/dL (ref 0.57–1.00)
Globulin, Total: 2.4 g/dL (ref 1.5–4.5)
Glucose: 90 mg/dL (ref 70–99)
Potassium: 5.4 mmol/L — ABNORMAL HIGH (ref 3.5–5.2)
Sodium: 142 mmol/L (ref 134–144)
Total Protein: 6.7 g/dL (ref 6.0–8.5)
eGFR: 58 mL/min/{1.73_m2} — ABNORMAL LOW (ref >59)

## 2022-10-01 LAB — LIPID PANEL
Chol/HDL Ratio: 2.3 ratio (ref 0.0–4.4)
Cholesterol, Total: 171 mg/dL (ref 100–199)
HDL: 73 mg/dL (ref >39)
LDL Chol Calc (NIH): 75 mg/dL (ref 0–99)
Triglycerides: 132 mg/dL (ref 0–149)
VLDL Cholesterol Cal: 23 mg/dL (ref 5–40)

## 2022-11-17 LAB — HM MAMMOGRAPHY

## 2022-11-17 LAB — HM DEXA SCAN

## 2022-12-18 ENCOUNTER — Other Ambulatory Visit: Payer: Self-pay

## 2022-12-18 MED ORDER — ATORVASTATIN CALCIUM 10 MG PO TABS: 10.0000 mg | ORAL_TABLET | Freq: Every day | ORAL | 2 refills | Status: DC

## 2023-03-29 ENCOUNTER — Encounter: Payer: Self-pay | Admitting: Internal Medicine

## 2023-03-30 ENCOUNTER — Other Ambulatory Visit: Payer: Self-pay

## 2023-03-30 MED ORDER — PAROXETINE HCL 20 MG PO TABS: 20.0000 mg | ORAL_TABLET | Freq: Every day | ORAL | 2 refills | Status: DC

## 2023-04-05 ENCOUNTER — Encounter: Payer: Self-pay | Admitting: Internal Medicine

## 2023-04-06 ENCOUNTER — Encounter: Payer: Self-pay | Admitting: Internal Medicine

## 2023-04-06 ENCOUNTER — Telehealth (INDEPENDENT_AMBULATORY_CARE_PROVIDER_SITE_OTHER): Payer: Medicare Other | Admitting: Internal Medicine

## 2023-04-06 VITALS — Ht 64.0 in

## 2023-04-06 DIAGNOSIS — F4321 Adjustment disorder with depressed mood: Secondary | ICD-10-CM | POA: Diagnosis not present

## 2023-04-06 DIAGNOSIS — R7303 Prediabetes: Secondary | ICD-10-CM

## 2023-04-06 DIAGNOSIS — E7800 Pure hypercholesterolemia, unspecified: Secondary | ICD-10-CM

## 2023-04-06 DIAGNOSIS — N182 Chronic kidney disease, stage 2 (mild): Secondary | ICD-10-CM | POA: Diagnosis not present

## 2023-04-06 DIAGNOSIS — F5102 Adjustment insomnia: Secondary | ICD-10-CM | POA: Insufficient documentation

## 2023-04-06 DIAGNOSIS — M79641 Pain in right hand: Secondary | ICD-10-CM | POA: Insufficient documentation

## 2023-04-06 DIAGNOSIS — M79642 Pain in left hand: Secondary | ICD-10-CM

## 2023-04-06 NOTE — Patient Instructions (Addendum)
Rheumatologist - Dr. Pollyann Savoy  Managing Loss, Adult People experience loss in many different ways throughout their lives. Events such as moving, changing jobs, and losing friends can create a sense of loss. The loss may be as serious as a major health change, divorce, death of a pet, or death of a loved one. All of these types of loss are likely to create a physical and emotional reaction known as grief. Grief is the result of a major change or an absence of something or someone that you count on. Grief is a normal reaction to loss. A variety of factors can affect your grieving experience, including: The nature of your loss. Your relationship to what or whom you lost. Your understanding of grief and how to manage it. Your support system. Be aware that when grief becomes extreme, it can lead to more severe issues like isolation, depression, anxiety, or suicidal thoughts. Talk with your health care provider if you have any of these issues. How to manage lifestyle changes Keep to your normal routine as much as possible. If you have trouble focusing or doing normal activities, it is acceptable to take some time away from your normal routine. Spend time with friends and loved ones. Eat a healthy diet, get plenty of sleep, and rest when you feel tired. How to recognize changes  The way that you deal with your grief will affect your ability to function as you normally do. When grieving, you may experience these changes: Numbness, shock, sadness, anxiety, anger, denial, and guilt. Thoughts about death. Unexpected crying. A physical sensation of emptiness in your stomach. Problems sleeping and eating. Tiredness (fatigue). Loss of interest in normal activities. Dreaming about or imagining seeing the person who died. A need to remember what or whom you lost. Difficulty thinking about anything other than your loss for a period of time. Relief. If you have been expecting the loss for a while,  you may feel a sense of relief when it happens. Follow these instructions at home: Activity Express your feelings in healthy ways, such as: Talking with others about your loss. It may be helpful to find others who have had a similar loss, such as a support group. Writing down your feelings in a journal. Doing physical activities to release stress and emotional energy. Doing creative activities like painting, sculpting, or playing or listening to music. Practicing resilience. This is the ability to recover and adjust after facing challenges. Reading some resources that encourage resilience may help you to learn ways to practice those behaviors.  General instructions Be patient with yourself and others. Allow the grieving process to happen, and remember that grieving takes time. It is likely that you may never feel completely done with some grief. You may find a way to move on while still cherishing memories and feelings about your loss. Accepting your loss is a process. It can take months or longer to adjust. Keep all follow-up visits. This is important. Where to find support To get support for managing loss: Ask your health care provider for help and recommendations, such as grief counseling or therapy. Think about joining a support group for people who are managing a loss. Where to find more information You can find more information about managing loss from: American Society of Clinical Oncology: www.cancer.net American Psychological Association: [employer] Contact a health care provider if: Your grief is extreme and keeps getting worse. You have ongoing grief that does not improve. Your body shows symptoms of grief, such as  illness. You feel depressed, anxious, or hopeless. Get help right away if: You have thoughts about hurting yourself or others. Get help right away if you feel like you may hurt yourself or others, or have thoughts about taking your own life. Go to your nearest  emergency room or: Call 911. Call the National Suicide Prevention Lifeline at (640)463-6653 or 988. This is open 24 hours a day. Text the Crisis Text Line at 215-126-6794. Summary Grief is the result of a major change or an absence of someone or something that you count on. Grief is a normal reaction to loss. The depth of grief and the period of recovery depend on the type of loss and your ability to adjust to the change and process your feelings. Processing grief requires patience and a willingness to accept your feelings and talk about your loss with people who are supportive. It is important to find resources that work for you and to realize that people experience grief differently. There is not one grieving process that works for everyone in the same way. Be aware that when grief becomes extreme, it can lead to more severe issues like isolation, depression, anxiety, or suicidal thoughts. Talk with your health care provider if you have any of these issues. This information is not intended to replace advice given to you by your health care provider. Make sure you discuss any questions you have with your health care provider. Document Revised: 12/30/2020 Document Reviewed: 12/30/2020 Elsevier Patient Education  2024 [employer].

## 2023-04-06 NOTE — Progress Notes (Signed)
Virtual Visit via Video Note  I,[name], CMA,acting as a Neurosurgeon for [name], MD.,have documented all relevant documentation on the behalf of [name], MD,as directed by  [name], MD while in the presence of [name], MD.  I connected with Jenny [name] on 04/06/23 at  2:00 PM EST by a video enabled telemedicine application and verified that I am speaking with the correct person using two identifiers.  Patient Location: Home in [place], [place] (also has [place] home) Provider Location: Office/Clinic  I discussed the limitations, risks, security, and privacy concerns of performing an evaluation and management service by video and the availability of in person appointments. I also discussed with the patient that there may be a patient responsible charge related to this service. The patient expressed understanding and agreed to proceed.  Subjective: PCP: Dorothyann Peng, MD  Chief Complaint  Patient presents with   Hyperlipidemia   Pt presents virtually today for chol f/u. She was scheduled for an in-person visit; however, while at her [place] home, a good friend of hers passed unexpectedly.  She reports compliance with atorvastatin. She has not had any issues with the medication. She denies myalgias, headaches, chest pain and shortness of breath.   She adds that she wants to increase Paxil dose, due to recent family losses and stressors, she is more anxious & depressed.   She states she has two friends that have been diagnosed with cancer.      Hyperlipidemia This is a chronic problem. The current episode started more than 1 year ago. The problem is controlled. She has no history of chronic renal disease or hypothyroidism. Pertinent negatives include no chest pain, focal weakness or shortness of breath. Current antihyperlipidemic treatment includes statins. The current treatment provides moderate improvement of lipids. Risk factors for  coronary artery disease include dyslipidemia and post-menopausal.     ROS:  Review of Systems  Constitutional: Negative.   HENT: Negative.    Eyes: Negative.   Respiratory: Negative.  Negative for shortness of breath.   Cardiovascular: Negative.  Negative for chest pain.  Gastrointestinal: Negative.   Musculoskeletal:  Positive for joint pain.       She c/o b/l hand pain. She was previously referred to Rheumatology; however, she never responded to their phone calls. Her pain has persisted.   Neurological: Negative.  Negative for focal weakness.  Psychiatric/Behavioral:  Positive for depression. The patient is nervous/anxious and has insomnia.      Current Outpatient Medications:    Alpha-D-Galactosidase (BEANO PO), Take by mouth. prn, Disp: , Rfl:    atorvastatin (LIPITOR) 10 MG tablet, Take 1 tablet (10 mg total) by mouth daily., Disp: 90 tablet, Rfl: 2   Calcium-Vitamin D-Vitamin K (CALCIUM SOFT CHEWS PO), Take by mouth., Disp: , Rfl:    Cholecalciferol (VITAMIN D-3) 25 MCG (1000 UT) CAPS, Take by mouth., Disp: , Rfl:    DENTA 5000 PLUS 1.1 % CREA dental cream, PLACE 1 APPLICATION ONTO TEETH AT BEDTIME AS DIRECTED, Disp: 51 g, Rfl: 3   diphenhydrAMINE (BENADRYL) 25 mg capsule, Take 25 mg by mouth at bedtime as needed for sleep. , Disp: , Rfl:    Lactase (LACTAID PO), Take by mouth. prn, Disp: , Rfl:    PARoxetine (PAXIL) 20 MG tablet, Take 1 tablet (20 mg total) by mouth daily., Disp: 90 tablet, Rfl: 2  Observations/Objective: Today's Vitals   04/06/23 1148  Height: 5\' 4"  (1.626 m)   Physical  Exam Vitals and nursing note reviewed.  Constitutional:      Appearance: Normal appearance.  HENT:     Head: Normocephalic and atraumatic.  Eyes:     Extraocular Movements: Extraocular movements intact.  Cardiovascular:     Rate and Rhythm: Normal rate and regular rhythm.     Heart sounds: Normal heart sounds.  Pulmonary:     Effort: Pulmonary effort is normal.     Breath sounds:  Normal breath sounds.  Musculoskeletal:     Cervical back: Normal range of motion.  Skin:    General: Skin is warm.  Neurological:     General: No focal deficit present.     Mental Status: She is alert.  Psychiatric:        Mood and Affect: Mood normal.        Behavior: Behavior normal.     Assessment and Plan: Pure hypercholesterolemia Assessment & Plan: Chronic, currently on atorvastatin 10mg  daily. Encouraged to follow heart healthy lifestyle.   Orders: -     Lipid panel; Future -     CMP14+EGFR; Future -     TSH; Future  Grief Assessment & Plan: She is encouraged to consider grief counseling through hospice. I think it is reasonable to increase dose of Paxil to 30mg  daily. She is currently taking 20mg , so I can add 10mg  dose to her current regimen. She agrees to f/u in six weeks for re-evaluation. This visit will need to be virtual as well.    Adjustment insomnia Assessment & Plan: Chronic, advised to consider supplementation with nightly magnesium glycinate. Low dose melatonin may also be helpful.    Chronic kidney disease, stage II (mild) Assessment & Plan: Chronic, she is encouraged to keep BP well controlled and to stay well hydrated to decrease risk of CKD progression.     Orders: -     CMP14+EGFR; Future  Prediabetes Assessment & Plan: Previous labs reviewed, her A1c has been elevated in the past. I will check an A1c today. Reminded to avoid refined sugars including sugary drinks/foods and processed meats including bacon, sausages and deli meats.    Orders: -     CMP14+EGFR; Future -     CBC; Future -     Hemoglobin A1c; Future  Bilateral hand pain Assessment & Plan: Chronic, she agrees to do labwork in [place]. I will mail her labslip. She agrees to go to local [facility] to have bloodwork drawn. She is advised to contact Dr. Ardelle Balls office to schedule an appointment.   Orders: -     ANA, IFA (with reflex) -     CYCLIC CITRUL PEPTIDE ANTIBODY,  IGG/IGA -     Rheumatoid factor -     Sedimentation rate -     Uric acid    Follow Up Instructions: Return in about 6 months (around 10/04/2023), or chol check.   I discussed the assessment and treatment plan with the patient. The patient was provided an opportunity to ask questions, and all were answered. The patient agreed with the plan and demonstrated an understanding of the instructions.   The patient was advised to call back or seek an in-person evaluation if the symptoms worsen or if the condition fails to improve as anticipated.  The above assessment and management plan was discussed with the patient. The patient verbalized understanding of and has agreed to the management plan.   I, [name], MD, have reviewed all documentation for this visit. The documentation on 04/06/23 for  the exam, diagnosis, procedures, and orders are all accurate and complete.

## 2023-04-06 NOTE — Assessment & Plan Note (Signed)
Chronic, she agrees to do labwork in [place]. I will mail her labslip. She agrees to go to local [facility] to have bloodwork drawn. She is advised to contact Dr. Ardelle Balls office to schedule an appointment.

## 2023-04-06 NOTE — Assessment & Plan Note (Signed)
Chronic, currently on atorvastatin 10mg  daily. Encouraged to follow heart healthy lifestyle.

## 2023-04-06 NOTE — Assessment & Plan Note (Signed)
Chronic, she is encouraged to keep BP well controlled and to stay well hydrated to decrease risk of CKD progression.

## 2023-04-06 NOTE — Assessment & Plan Note (Signed)
Chronic, advised to consider supplementation with nightly magnesium glycinate. Low dose melatonin may also be helpful.

## 2023-04-06 NOTE — Assessment & Plan Note (Signed)
She is encouraged to consider grief counseling through hospice. I think it is reasonable to increase dose of Paxil to 30mg  daily. She is currently taking 20mg , so I can add 10mg  dose to her current regimen. She agrees to f/u in six weeks for re-evaluation. This visit will need to be virtual as well.

## 2023-04-06 NOTE — Assessment & Plan Note (Signed)
Previous labs reviewed, her A1c has been elevated in the past. I will check an A1c today. Reminded to avoid refined sugars including sugary drinks/foods and processed meats including bacon, sausages and deli meats.  

## 2023-04-15 ENCOUNTER — Encounter: Payer: Self-pay | Admitting: Internal Medicine

## 2023-04-29 ENCOUNTER — Other Ambulatory Visit: Payer: Self-pay | Admitting: Internal Medicine

## 2023-04-30 LAB — CYCLIC CITRUL PEPTIDE ANTIBODY, IGG/IGA: Cyclic Citrullin Peptide Ab: 29 U — ABNORMAL HIGH (ref 0–19)

## 2023-04-30 LAB — RHEUMATOID FACTOR: Rheumatoid fact SerPl-aCnc: <10 [IU]/mL (ref <14.0)

## 2023-04-30 LAB — URIC ACID: Uric Acid: 4.5 mg/dL (ref 3.1–7.9)

## 2023-04-30 LAB — SEDIMENTATION RATE: Sed Rate: 11 mm/h (ref 0–40)

## 2023-05-04 ENCOUNTER — Other Ambulatory Visit: Payer: Self-pay | Admitting: Internal Medicine

## 2023-05-27 ENCOUNTER — Encounter: Payer: Self-pay | Admitting: Internal Medicine

## 2023-05-27 ENCOUNTER — Telehealth: Payer: Medicare Other | Admitting: Internal Medicine

## 2023-05-27 DIAGNOSIS — M5136 Other intervertebral disc degeneration, lumbar region with discogenic back pain only: Secondary | ICD-10-CM | POA: Diagnosis not present

## 2023-05-27 DIAGNOSIS — F5102 Adjustment insomnia: Secondary | ICD-10-CM

## 2023-05-27 DIAGNOSIS — F4321 Adjustment disorder with depressed mood: Secondary | ICD-10-CM | POA: Diagnosis not present

## 2023-05-27 NOTE — Progress Notes (Signed)
 Virtual Visit via Video Note  I,[name], CMA,acting as a neurosurgeon for Jenny LOISE Slocumb, MD.,have documented all relevant documentation on the behalf of Jenny LOISE Slocumb, MD,as directed by  Jenny LOISE Slocumb, MD while in the presence of Jenny LOISE Slocumb, MD.  I connected with Jenny [name] on 05/30/23 at  4:20 PM EST by a video enabled telemedicine application and verified that I am speaking with the correct person using two identifiers.  Patient Location: Home Provider Location: Office/Clinic  I discussed the limitations, risks, security, and privacy concerns of performing an evaluation and management service by video and the availability of in person appointments. I also discussed with the patient that there may be a patient responsible charge related to this service. The patient expressed understanding and agreed to proceed.  Subjective: PCP: [name] Catheryn, MD  No chief complaint on file.  Patient presents today for f/u grief.  The dose of her paroxetine  was increased to 30mg . She never increased the dose of her medication. Most recent funeral was 2 weeks ago.  She states she is still crying a lot. She is now interested in now increasing the dose of paroxetine .   She reports experiencing back pain. She wants to know if she should see Dr Burnetta or purchase a back brace. She has back pain with ambulation and after standing for long periods of time. Denies new paresthesias/weakness.  Denies urinary/fecal incontinence.        ROS: Per HPI  Current Outpatient Medications:    Alpha-D-Galactosidase (BEANO PO), Take by mouth. prn, Disp: , Rfl:    atorvastatin  (LIPITOR) 10 MG tablet, Take 1 tablet (10 mg total) by mouth daily., Disp: 90 tablet, Rfl: 2   Calcium -Vitamin D -Vitamin K (CALCIUM  SOFT CHEWS PO), Take by mouth., Disp: , Rfl:    Cholecalciferol (VITAMIN D -3) 25 MCG (1000 UT) CAPS, Take by mouth., Disp: , Rfl:    DENTA 5000 PLUS  1.1 % CREA dental cream, PLACE 1  APPLICATION ONTO TEETH AT BEDTIME AS DIRECTED, Disp: 51 g, Rfl: 3   diphenhydrAMINE (BENADRYL) 25 mg capsule, Take 25 mg by mouth at bedtime as needed for sleep. , Disp: , Rfl:    Lactase (LACTAID PO), Take by mouth. prn, Disp: , Rfl:    PARoxetine  (PAXIL ) 20 MG tablet, Take 1 tablet (20 mg total) by mouth daily., Disp: 90 tablet, Rfl: 2  Observations/Objective: There were no vitals filed for this visit. Physical Exam Vitals and nursing note reviewed.  Constitutional:      Appearance: Normal appearance.  HENT:     Head: Normocephalic and atraumatic.  Eyes:     Extraocular Movements: Extraocular movements intact.  Cardiovascular:     Rate and Rhythm: Normal rate and regular rhythm.  Pulmonary:     Effort: Pulmonary effort is normal.  Skin:    General: Skin is warm.  Neurological:     General: No focal deficit present.     Mental Status: She is alert.  Psychiatric:        Mood and Affect: Mood normal.        Behavior: Behavior normal.     Assessment and Plan: Grief Assessment & Plan: She is again encouraged to consider grief counseling through hospice. I will increase dose of Paxil  to 30mg  daily. She is currently taking 20mg , so she is advised to take 1-1/2 tabs since tablets are scored. She agrees to f/u in six weeks for re-evaluation.    Degeneration of intervertebral disc of lumbar  region with discogenic back pain Assessment & Plan: She is returning to [place]  soon, so she plans to reach out to her Orthopedic surgeon there for further evaluation. She will let me know if she needs referral to schedule appt.     TIME SPENT DURING VISIT: 20 minutes  Follow Up Instructions: Return in about 6 weeks (around 07/08/2023), or med check.   I discussed the assessment and treatment plan with the patient. The patient was provided an opportunity to ask questions, and all were answered. The patient agreed with the plan and demonstrated an understanding of the instructions.   The  patient was advised to call back or seek an in-person evaluation if the symptoms worsen or if the condition fails to improve as anticipated.  The above assessment and management plan was discussed with the patient. The patient verbalized understanding of and has agreed to the management plan.   I, Jenny LOISE Slocumb, MD, have reviewed all documentation for this visit. The documentation on 05/27/23 for the exam, diagnosis, procedures, and orders are all accurate and complete.

## 2023-05-27 NOTE — Patient Instructions (Addendum)
 Chronic Back Pain Chronic back pain is back pain that lasts longer than 3 months. The pain may get worse at certain times (flare-ups). There are things you can do at home to manage your pain. Follow these instructions at home: Watch for any changes in your symptoms. Take these actions to help with your pain: Managing pain and stiffness     If told, put ice on the painful area. You may be told to use ice for 24-48 hours after a flare-up starts. Put ice in a plastic bag. Place a towel between your skin and the bag. Leave the ice on for 20 minutes, 2-3 times a day. If told, put heat on the painful area. Do this as often as told by your doctor. Use the heat source that your doctor recommends, such as a moist heat pack or a heating pad. Place a towel between your skin and the heat source. Leave the heat on for 20-30 minutes. If your skin turns bright red, take off the ice or heat right away to prevent skin damage. The risk of damage is higher if you cannot feel pain, heat, or cold. Soak in a warm bath. This can help with pain. Activity        Avoid bending and other activities that make the pain worse. When you stand: Keep your upper back and neck straight. Keep your shoulders pulled back. Avoid slouching. When you sit: Keep your back straight. Relax your shoulders. Do not round your shoulders or pull them backward. Do not sit or stand in one place for too long. Take short rest breaks during the day. Lying down or standing is often better than sitting. Resting can help relieve pain. When sitting or lying down for a long time, do some mild activity or stretching. This will help to prevent stiffness and pain. Get regular exercise. Ask your doctor what activities are safe for you. You may have to avoid lifting. Ask your provider how much you can safely lift. If you lift things: Bend your knees. Keep the weight close to your body. Avoid twisting. Medicines Take over-the-counter and  prescription medicines only as told by your doctor. You may need to take medicines for pain and swelling. These may be taken by mouth or put on the skin. You may also be given muscle relaxants. Ask your doctor if the medicine prescribed to you: Requires you to avoid driving or using machinery. Can cause trouble pooping (constipation). You may need to take these actions to prevent or treat trouble pooping: Drink enough fluid to keep your pee (urine) pale yellow. Take over-the-counter or prescription medicines. Eat foods that are high in fiber. These include beans, whole grains, and fresh fruits and vegetables. Limit foods that are high in fat and sugars. These include fried or sweet foods. General instructions  Sleep on a firm mattress. Try lying on your side with your knees slightly bent. If you lie on your back, put a pillow under your knees. Do not smoke or use any products that contain nicotine or tobacco. If you need help quitting, ask your doctor. Contact a doctor if: Your pain does not get better with rest or medicine. You have new pain. You have a fever. You lose weight quickly. You have trouble doing your normal activities. One or both of your legs or feet feel weak. One or both of your legs or feet lose feeling (have numbness). Get help right away if: You are not able to control when you pee  or poop. You have bad back pain and: You feel like you may vomit (nauseous). You vomit. You have pain in your chest or your belly (abdomen). You have shortness of breath. You faint. These symptoms may be an emergency. Get help right away. Call 911. Do not wait to see if the symptoms will go away. Do not drive yourself to the hospital. This information is not intended to replace advice given to you by your health care provider. Make sure you discuss any questions you have with your health care provider. Document Revised: 12/29/2021 Document Reviewed: 12/29/2021 Elsevier Patient Education   2024 [employer].

## 2023-05-30 NOTE — Assessment & Plan Note (Signed)
 She is returning to [place] soon, so she plans to reach out to her Orthopedic surgeon there for further evaluation. She will let me know if she needs referral to schedule appt.

## 2023-05-30 NOTE — Assessment & Plan Note (Addendum)
 She is again encouraged to consider grief counseling through hospice. I will increase dose of Paxil to 30mg  daily. She is currently taking 20mg , so she is advised to take 1-1/2 tabs since tablets are scored. She agrees to f/u in six weeks for re-evaluation

## 2023-07-08 ENCOUNTER — Encounter: Payer: Self-pay | Admitting: Internal Medicine

## 2023-07-08 ENCOUNTER — Telehealth (INDEPENDENT_AMBULATORY_CARE_PROVIDER_SITE_OTHER): Payer: Medicare Other | Admitting: Internal Medicine

## 2023-07-08 DIAGNOSIS — F5102 Adjustment insomnia: Secondary | ICD-10-CM

## 2023-07-08 DIAGNOSIS — F4321 Adjustment disorder with depressed mood: Secondary | ICD-10-CM

## 2023-07-08 MED ORDER — PAROXETINE HCL 30 MG PO TABS: 30.0000 mg | ORAL_TABLET | Freq: Every day | ORAL | 2 refills | Status: DC

## 2023-07-08 NOTE — Progress Notes (Signed)
 Virtual Visit via Video Note  I,[name], CMA,acting as a Neurosurgeon for [name], MD.,have documented all relevant documentation on the behalf of [name], MD,as directed by  [name], MD while in the presence of [name], MD.  I connected with Jenny [name] on 07/11/23 at  4:20 PM EST by a video enabled telemedicine application and verified that I am speaking with the correct person using two identifiers.  Patient Location: Home Provider Location: Office/Clinic  I discussed the limitations, risks, security, and privacy concerns of performing an evaluation and management service by video and the availability of in person appointments. I also discussed with the patient that there may be a patient responsible charge related to this service. The patient expressed understanding and agreed to proceed.  Subjective: PCP: Jenny Peng, MD  Chief Complaint  Patient presents with   Med Check   Patient presents today for virtual visit. She prefers this type of visit.  She presents for Paxil follow up. She is now taking Paxil 20mg  daily. She can't say that she feels any different. She had a friend pass from pancreatic cancer last November. Three other friends have terminal cancer. She is having difficulty dealing with this.     ROS: Per HPI  Current Outpatient Medications:    Alpha-D-Galactosidase (BEANO PO), Take by mouth. prn, Disp: , Rfl:    atorvastatin (LIPITOR) 10 MG tablet, Take 1 tablet (10 mg total) by mouth daily., Disp: 90 tablet, Rfl: 2   Calcium-Vitamin D-Vitamin K (CALCIUM SOFT CHEWS PO), Take by mouth., Disp: , Rfl:    Cholecalciferol (VITAMIN D-3) 25 MCG (1000 UT) CAPS, Take by mouth., Disp: , Rfl:    diphenhydrAMINE (BENADRYL) 25 mg capsule, Take 25 mg by mouth at bedtime as needed for sleep. , Disp: , Rfl:    Lactase (LACTAID PO), Take by mouth. prn, Disp: , Rfl:    PARoxetine (PAXIL) 30 MG tablet, Take 1 tablet (30 mg total) by mouth  daily., Disp: 90 tablet, Rfl: 2  Observations/Objective: There were no vitals filed for this visit. Physical Exam Vitals and nursing note reviewed.  Constitutional:      Appearance: Normal appearance.  HENT:     Head: Normocephalic and atraumatic.  Eyes:     Extraocular Movements: Extraocular movements intact.  Pulmonary:     Effort: Pulmonary effort is normal.  Musculoskeletal:     Cervical back: Normal range of motion.  Skin:    General: Skin is warm.  Neurological:     General: No focal deficit present.     Mental Status: She is alert.  Psychiatric:        Mood and Affect: Mood normal.        Behavior: Behavior normal.     Assessment and Plan: Grief Assessment & Plan: At this time, she agrees to Psychology evaluation.  She does prefer a provider that offers virtual appts since she is going back and forth to [place].  She will continue with current dose of Paxil to 30mg  daily. She is advised to take 1-1/2 tabs since tablets are scored. She will keep upcoming in-person appointment in May 2025.    Orders: -     Ambulatory referral to Psychology  Adjustment insomnia Assessment & Plan: Chronic, advised to consider supplementation with nightly magnesium glycinate. Low dose melatonin may also be helpful.   Orders: -     Ambulatory referral to Psychology  Other orders -  PARoxetine HCl; Take 1 tablet (30 mg total) by mouth daily.  Dispense: 90 tablet; Refill: 2   TIME IN VISIT: 15 minutes 22 seconds  Follow Up Instructions: Return if symptoms worsen or fail to improve.   I discussed the assessment and treatment plan with the patient. The patient was provided an opportunity to ask questions, and all were answered. The patient agreed with the plan and demonstrated an understanding of the instructions.   The patient was advised to call back or seek an in-person evaluation if the symptoms worsen or if the condition fails to improve as anticipated.  The above  assessment and management plan was discussed with the patient. The patient verbalized understanding of and has agreed to the management plan.   I, [name], MD, have reviewed all documentation for this visit. The documentation on 07/08/23 for the exam, diagnosis, procedures, and orders are all accurate and complete.

## 2023-07-08 NOTE — Patient Instructions (Signed)
Managing Loss, Adult  People experience loss in many different ways throughout their lives. Events such as moving, changing jobs, and losing friends can create a sense of loss. The loss may be as serious as a major health change, divorce, death of a pet, or death of a loved one. All of these types of loss are likely to create a physical and emotional reaction known as grief. Grief is the result of a major change or an absence of something or someone that you count on. Grief is a normal reaction to loss.  A variety of factors can affect your grieving experience, including:  The nature of your loss.  Your relationship to what or whom you lost.  Your understanding of grief and how to manage it.  Your support system.  Be aware that when grief becomes extreme, it can lead to more severe issues like isolation, depression, anxiety, or suicidal thoughts. Talk with your health care provider if you have any of these issues.  How to manage lifestyle changes    Keep to your normal routine as much as possible.  If you have trouble focusing or doing normal activities, it is acceptable to take some time away from your normal routine.  Spend time with friends and loved ones.  Eat a healthy diet, get plenty of sleep, and rest when you feel tired.  How to recognize changes   The way that you deal with your grief will affect your ability to function as you normally do. When grieving, you may experience these changes:  Numbness, shock, sadness, anxiety, anger, denial, and guilt.  Thoughts about death.  Unexpected crying.  A physical sensation of emptiness in your stomach.  Problems sleeping and eating.  Tiredness (fatigue).  Loss of interest in normal activities.  Dreaming about or imagining seeing the person who died.  A need to remember what or whom you lost.  Difficulty thinking about anything other than your loss for a period of time.  Relief. If you have been expecting the loss for a while, you may feel a sense of relief when it  happens.  Follow these instructions at home:  Activity    Express your feelings in healthy ways, such as:  Talking with others about your loss. It may be helpful to find others who have had a similar loss, such as a support group.  Writing down your feelings in a journal.  Doing physical activities to release stress and emotional energy.  Doing creative activities like painting, sculpting, or playing or listening to music.  Practicing resilience. This is the ability to recover and adjust after facing challenges. Reading some resources that encourage resilience may help you to learn ways to practice those behaviors.     General instructions  Be patient with yourself and others. Allow the grieving process to happen, and remember that grieving takes time.  It is likely that you may never feel completely done with some grief. You may find a way to move on while still cherishing memories and feelings about your loss.  Accepting your loss is a process. It can take months or longer to adjust.  Keep all follow-up visits. This is important.  Where to find support  To get support for managing loss:  Ask your health care provider for help and recommendations, such as grief counseling or therapy.  Think about joining a support group for people who are managing a loss.  Where to find more information  You can find more information  about managing loss from:  American Society of Clinical Oncology: www.cancer.net  American Psychological Association: [employer]  Contact a health care provider if:  Your grief is extreme and keeps getting worse.  You have ongoing grief that does not improve.  Your body shows symptoms of grief, such as illness.  You feel depressed, anxious, or hopeless.  Get help right away if:  You have thoughts about hurting yourself or others.  Get help right away if you feel like you may hurt yourself or others, or have thoughts about taking your own life. Go to your nearest emergency room or:  Call 911.  Call the  National Suicide Prevention Lifeline at 985-787-5965 or 988. This is open 24 hours a day.  Text the Crisis Text Line at (308)798-1395.  Summary  Grief is the result of a major change or an absence of someone or something that you count on. Grief is a normal reaction to loss.  The depth of grief and the period of recovery depend on the type of loss and your ability to adjust to the change and process your feelings.  Processing grief requires patience and a willingness to accept your feelings and talk about your loss with people who are supportive.  It is important to find resources that work for you and to realize that people experience grief differently. There is not one grieving process that works for everyone in the same way.  Be aware that when grief becomes extreme, it can lead to more severe issues like isolation, depression, anxiety, or suicidal thoughts. Talk with your health care provider if you have any of these issues.  This information is not intended to replace advice given to you by your health care provider. Make sure you discuss any questions you have with your health care provider.  Document Revised: 12/30/2020 Document Reviewed: 12/30/2020  Elsevier Patient Education  2024 [employer].

## 2023-07-11 NOTE — Assessment & Plan Note (Signed)
 Chronic, advised to consider supplementation with nightly magnesium glycinate. Low dose melatonin may also be helpful.

## 2023-07-11 NOTE — Assessment & Plan Note (Addendum)
 At this time, she agrees to Psychology evaluation.  She does prefer a provider that offers virtual appts since she is going back and forth to [place].  She will continue with current dose of Paxil to 30mg  daily. She is advised to take 1-1/2 tabs since tablets are scored. She will keep upcoming in-person appointment in May 2025.

## 2023-07-14 ENCOUNTER — Ambulatory Visit: Payer: Medicare Other

## 2023-07-14 DIAGNOSIS — Z0000 Encounter for general adult medical examination without abnormal findings: Secondary | ICD-10-CM

## 2023-07-14 NOTE — Patient Instructions (Signed)
 Jenny [name] , Thank you for taking time to come for your Medicare Wellness Visit. I appreciate your ongoing commitment to your health goals. Please review the following plan we discussed and let me know if I can assist you in the future.   Referrals/Orders/Follow-Ups/Clinician Recommendations: psychologist can not work with patient since she is not in [place]. Message sent to Dr. Allyne Gee  This is a list of the screening recommended for you and due dates:  Health Maintenance  Topic Date Due   Zoster (Shingles) Vaccine (1 of 2) 01/01/1965   COVID-19 Vaccine (7 - 2024-25 season) 07/23/2023   Mammogram  11/17/2023   Medicare Annual Wellness Visit  07/13/2024   DTaP/Tdap/Td vaccine (3 - Td or Tdap) 12/23/2032   Pneumonia Vaccine  Completed   Flu Shot  Completed   DEXA scan (bone density measurement)  Completed   Hepatitis C Screening  Completed   HPV Vaccine  Aged Out   Colon Cancer Screening  Discontinued    Advanced directives: (Copy Requested) Please bring a copy of your health care power of attorney and living will to the office to be added to your chart at your convenience.  Next Medicare Annual Wellness Visit scheduled for next year: No, office will schedule  insert Preventive Care attachment Insert FALL PREVENTION attachment if needed

## 2023-07-14 NOTE — Progress Notes (Signed)
 Subjective:   Jenny [name] is a 78 y.o. female who presents for Medicare Annual (Subsequent) preventive examination.  Visit Complete: Virtual I connected with  Jenny [name] on 07/14/23 by a audio enabled telemedicine application and verified that I am speaking with the correct person using two identifiers. Interactive audio and video telecommunications were attempted between this provider and patient, however failed, due to patient having technical difficulties OR patient did not have access to video capability.  We continued and completed visit with audio only.  Patient Location: Home  Provider Location: Home Office  I discussed the limitations of evaluation and management by telemedicine. The patient expressed understanding and agreed to proceed.  Vital Signs: Because this visit was a virtual/telehealth visit, some criteria may be missing or patient reported. Any vitals not documented were not able to be obtained and vitals that have been documented are patient reported.  Patient Medicare AWV questionnaire was completed by the patient on 07/13/2023; I have confirmed that all information answered by patient is correct and no changes since this date.  Cardiac Risk Factors include: advanced age (>65men, >73 women);dyslipidemia     Objective:    Today's Vitals   07/14/23 1036  PainSc: 1    There is no height or weight on file to calculate BMI.     07/14/2023   10:46 AM 06/25/2022   10:02 AM 05/14/2021   10:01 AM 11/06/2020    3:16 PM 05/02/2020   11:40 AM 04/11/2020    2:15 PM 04/09/2020   11:17 AM  Advanced Directives  Does Patient Have a Medical Advance Directive? No No No No No No No  Would patient like information on creating a medical advance directive? No - Patient declined   Yes (MAU/Ambulatory/Procedural Areas - Information given)  No - Patient declined No - Patient declined    Current Medications (verified) Outpatient Encounter Medications as of 07/14/2023   Medication Sig   Alpha-D-Galactosidase (BEANO PO) Take by mouth. prn   atorvastatin (LIPITOR) 10 MG tablet Take 1 tablet (10 mg total) by mouth daily.   Calcium-Vitamin D-Vitamin K (CALCIUM SOFT CHEWS PO) Take by mouth.   Cholecalciferol (VITAMIN D-3) 25 MCG (1000 UT) CAPS Take by mouth.   diphenhydrAMINE (BENADRYL) 25 mg capsule Take 25 mg by mouth at bedtime as needed for sleep.    Lactase (LACTAID PO) Take by mouth. prn   PARoxetine (PAXIL) 30 MG tablet Take 1 tablet (30 mg total) by mouth daily.   No facility-administered encounter medications on file as of 07/14/2023.    Allergies (verified) Clindamycin, Prochlorperazine, Ibandronate, Raloxifene, Diclofenac sodium, and Penicillin v potassium   History: Past Medical History:  Diagnosis Date   Allergy    Anemia    Anxiety    Arthritis    generalized   Bursitis of both hips    Cancer (HCC) 2010   Melanoma Left Arm   Cataract 2016,2017   Chronic kidney disease    Depression    Hyperlipidemia    Hypertension    Neuromuscular disorder (HCC)    Peripheral Nueropathy ( Left Side)   Plantar fasciitis    PONV (postoperative nausea and vomiting)    2014 (per pt, hard time waking up)   Preop cardiovascular exam 12/17/2016   Sleep apnea    Past Surgical History:  Procedure Laterality Date   BACK SURGERY  05/04/2013   Cervical Discectomy with Fusion by Dr.McGovern   BREAST SURGERY  1980   fibroadenoma   CATARACT EXTRACTION Left  06/14/2015   CATARACT EXTRACTION Right 05/08/2015   CATARACT EXTRACTION, BILATERAL  2017   CERVICAL SPINE SURGERY  2014   spinal fusion   EYE SURGERY  2016 2017   MELANOMA EXCISION Left 2010   left arm   SPINE SURGERY  2021   TONSILLECTOMY     removed at age 41   TRANSFORAMINAL LUMBAR INTERBODY FUSION (TLIF) WITH PEDICLE SCREW FIXATION 1 LEVEL N/A 04/11/2020   Procedure: TRANSFORAMINAL LUMBAR INTERBODY FUSION (TLIF) L5-S1, L4-5 LEFT LAMINOTOMY/ DECOMPRESSION;  Surgeon: Venita Lick, MD;   Location: [facility] OR;  Service: Orthopedics;  Laterality: N/A;  5 hrs   TUBAL LIGATION     Family History  Problem Relation Age of Onset   COPD Mother    Psychosis Mother    Anxiety disorder Mother    Depression Mother    COPD Father    Depression Father    Hypertension Father    Arthritis Father    Hearing loss Father    Lupus Daughter    Depression Paternal Aunt    Heart disease Paternal Grandmother    ADD / ADHD Brother    Social History   Socioeconomic History   Marital status: Married    Spouse name: Not on file   Number of children: 2   Years of education: Not on file   Highest education level: Bachelor's degree (e.g., BA, AB, BS)  Occupational History   Not on file  Tobacco Use   Smoking status: Former    Current packs/day: 0.00    Average packs/day: 0.3 packs/day for 2.0 years (0.5 ttl pk-yrs)    Types: Cigarettes    Start date: 09/27/1966    Quit date: 09/26/1968    Years since quitting: 54.8   Smokeless tobacco: Never  Vaping Use   Vaping status: Never Used  Substance and Sexual Activity   Alcohol use: Not Currently    Alcohol/week: 1.0 standard drink of alcohol    Types: 1 Cans of beer per week    Comment: Once a Week   Drug use: Never   Sexual activity: Yes    Birth control/protection: Post-menopausal  Other Topics Concern   Not on file  Social History Narrative   Not on file   Social Drivers of Health   Financial Resource Strain: Low Risk  (07/13/2023)   Overall Financial Resource Strain (CARDIA)    Difficulty of Paying Living Expenses: Not hard at all  Food Insecurity: No Food Insecurity (07/13/2023)   Hunger Vital Sign    Worried About Running Out of Food in the Last Year: Never true    Ran Out of Food in the Last Year: Never true  Transportation Needs: No Transportation Needs (07/13/2023)   PRAPARE - Administrator, Civil Service (Medical): No    Lack of Transportation (Non-Medical): No  Physical Activity: Inactive (07/13/2023)    Exercise Vital Sign    Days of Exercise per Week: 0 days    Minutes of Exercise per Session: 0 min  Stress: Stress Concern Present (07/13/2023)   [employer] of Occupational Health - Occupational Stress Questionnaire    Feeling of Stress : Very much  Social Connections: Socially Integrated (07/13/2023)   Social Connection and Isolation Panel [NHANES]    Frequency of Communication with Friends and Family: More than three times a week    Frequency of Social Gatherings with Friends and Family: Twice a week    Attends Religious Services: More than 4 times per year  Active Member of Clubs or Organizations: Yes    Attends Engineer, structural: More than 4 times per year    Marital Status: Married    Tobacco Counseling Counseling given: Not Answered   Clinical Intake:  Pre-visit preparation completed: Yes  Pain : 0-10 Pain Score: 1  Pain Type: Chronic pain Pain Location: Back Pain Orientation: Lower Pain Descriptors / Indicators: Aching Pain Onset: More than a month ago Pain Frequency: Constant     Nutritional Risks: None Diabetes: No  How often do you need to have someone help you when you read instructions, pamphlets, or other written materials from your doctor or pharmacy?: 1 - Never  Interpreter Needed?: No  Information entered by :: NAllen LPN   Activities of Daily Living    07/13/2023   11:35 AM  In your present state of health, do you have any difficulty performing the following activities:  Hearing? 0  Vision? 0  Difficulty concentrating or making decisions? 1  Walking or climbing stairs? 0  Dressing or bathing? 0  Doing errands, shopping? 0  Preparing Food and eating ? N  Using the Toilet? N  In the past six months, have you accidently leaked urine? Y  Do you have problems with loss of bowel control? N  Managing your Medications? N  Managing your Finances? N  Housekeeping or managing your Housekeeping? N    Patient Care Team: Dorothyann Peng, MD as PCP - General (Internal Medicine)  Indicate any recent Medical Services you may have received from other than [facility] providers in the past year (date may be approximate).     Assessment:   This is a routine wellness examination for Dorothy.  Hearing/Vision screen Hearing Screening - Comments:: Denies hearing issues Vision Screening - Comments:: Regular eye exams, Dr. Vassie Moselle   Goals Addressed             This Visit's Progress    Patient Stated       07/14/2023, start exercising       Depression Screen    07/14/2023   10:49 AM 07/08/2023    2:11 PM 04/06/2023   11:49 AM 09/30/2022    9:26 AM 06/25/2022   10:04 AM 05/14/2021   10:03 AM 11/28/2020   12:35 PM  PHQ 2/9 Scores  PHQ - 2 Score 5 5 4  0 5 0 0  PHQ- 9 Score 11 10 13  10       Fall Risk    07/13/2023   11:35 AM 09/30/2022    9:26 AM 06/25/2022   10:03 AM 05/14/2021   10:02 AM 05/02/2020   11:41 AM  Fall Risk   Falls in the past year? 1 0 1 0 1  Comment tripped  fell off the porch  legs gave out  Number falls in past yr: 1 0 0  0  Injury with Fall? 1 0 0  0  Comment broke foot      Risk for fall due to : History of fall(s);Medication side effect No Fall Risks Medication side effect Medication side effect Medication side effect  Follow up Falls prevention discussed;Falls evaluation completed Falls evaluation completed Falls prevention discussed;Education provided;Falls evaluation completed Falls evaluation completed;Education provided;Falls prevention discussed Falls evaluation completed;Education provided;Falls prevention discussed    MEDICARE RISK AT HOME: Medicare Risk at Home Any stairs in or around the home?: (Patient-Rptd) Yes If so, are there any without handrails?: (Patient-Rptd) No Home free of loose throw rugs in walkways, pet beds, electrical  cords, etc?: (Patient-Rptd) No Adequate lighting in your home to reduce risk of falls?: (Patient-Rptd) Yes Life alert?: (Patient-Rptd) No Use of a cane,  walker or w/c?: (Patient-Rptd) Yes Grab bars in the bathroom?: (Patient-Rptd) Yes Shower chair or bench in shower?: (Patient-Rptd) Yes Elevated toilet seat or a handicapped toilet?: (Patient-Rptd) No  TIMED UP AND GO:  Was the test performed?  No    Cognitive Function:        07/14/2023   10:52 AM 06/25/2022   10:10 AM 03/02/2022    3:29 PM 05/14/2021   10:06 AM 05/02/2020   11:45 AM  6CIT Screen  What Year? 0 points 0 points 0 points 0 points 0 points  What month? 0 points 0 points 0 points 0 points 0 points  What time? 0 points 3 points 0 points 0 points 0 points  Count back from 20 0 points 0 points 0 points 0 points 0 points  Months in reverse 0 points 0 points 0 points 0 points 0 points  Repeat phrase 0 points 0 points 0 points 0 points 2 points  Total Score 0 points 3 points 0 points 0 points 2 points    Immunizations Immunization History  Administered Date(s) Administered   Fluad Quad(high Dose 65+) 03/02/2022, 04/29/2023   Influenza Split 03/28/2010, 02/10/2011, 05/11/2012   Influenza, High Dose Seasonal PF 02/05/2016, 04/06/2017, 03/16/2018   Influenza, Quadrivalent, Recombinant, Inj, Pf 03/20/2019   Influenza, Seasonal, Injecte, Preservative Fre 04/04/2013   Influenza,inj,Quad PF,6+ Mos 04/11/2014, 03/15/2015   PFIZER(Purple Top)SARS-COV-2 Vaccination 07/24/2019, 08/14/2019, 05/14/2020   Pfizer Covid-19 Vaccine Bivalent Booster 1yrs & up 03/17/2021, 04/07/2022   Pfizer(Comirnaty)Fall Seasonal Vaccine 12 years and older 05/28/2023   Pneumococcal Conjugate-13 07/13/2014   Pneumococcal Polysaccharide-23 02/05/2016   Pneumococcal-Unspecified 03/30/2008   Tdap 05/11/2011, 12/24/2022   Zoster, Live 04/08/2016    TDAP status: Up to date  Flu Vaccine status: Up to date  Pneumococcal vaccine status: Up to date  Covid-19 vaccine status: Completed vaccines  Qualifies for Shingles Vaccine? Yes   Zostavax completed Yes   Shingrix Completed?: No.    Education has  been provided regarding the importance of this vaccine. Patient has been advised to call insurance company to determine out of pocket expense if they have not yet received this vaccine. Advised may also receive vaccine at local pharmacy or Health Dept. Verbalized acceptance and understanding.  Screening Tests Health Maintenance  Topic Date Due   Zoster Vaccines- Shingrix (1 of 2) 01/01/1965   COVID-19 Vaccine (7 - 2024-25 season) 07/23/2023   MAMMOGRAM  11/17/2023   Medicare Annual Wellness (AWV)  07/13/2024   DTaP/Tdap/Td (3 - Td or Tdap) 12/23/2032   Pneumonia Vaccine 16+ Years old  Completed   INFLUENZA VACCINE  Completed   DEXA SCAN  Completed   Hepatitis C Screening  Completed   HPV VACCINES  Aged Out   Colonoscopy  Discontinued    Health Maintenance  Health Maintenance Due  Topic Date Due   Zoster Vaccines- Shingrix (1 of 2) 01/01/1965    Colorectal cancer screening: No longer required.   Mammogram status: Completed 11/17/2022. Repeat every year  Bone Density status: Completed 06/16/2010.   Lung Cancer Screening: (Low Dose CT Chest recommended if Age 79-80 years, 20 pack-year currently smoking OR have quit w/in 15years.) does not qualify.   Lung Cancer Screening Referral: no  Additional Screening:  Hepatitis C Screening: does qualify; Completed 05/02/2020  Vision Screening: Recommended annual ophthalmology exams for early detection of glaucoma and  other disorders of the eye. Is the patient up to date with their annual eye exam?  Yes  Who is the provider or what is the name of the office in which the patient attends annual eye exams? Dr. Vassie Moselle If pt is not established with a provider, would they like to be referred to a provider to establish care? No .   Dental Screening: Recommended annual dental exams for proper oral hygiene  Diabetic Foot Exam: n/a  Community Resource Referral / Chronic Care Management: CRR required this visit?  No   CCM required this visit?   No     Plan:     I have personally reviewed and noted the following in the patient's chart:   Medical and social history Use of alcohol, tobacco or illicit drugs  Current medications and supplements including opioid prescriptions. Patient is not currently taking opioid prescriptions. Functional ability and status Nutritional status Physical activity Advanced directives List of other physicians Hospitalizations, surgeries, and ER visits in previous 12 months Vitals Screenings to include cognitive, depression, and falls Referrals and appointments  In addition, I have reviewed and discussed with patient certain preventive protocols, quality metrics, and best practice recommendations. A written personalized care plan for preventive services as well as general preventive health recommendations were provided to patient.     Barb Merino, LPN   08/31/8117   After Visit Summary: (MyChart) Due to this being a telephonic visit, the after visit summary with patients personalized plan was offered to patient via MyChart   Nurse Notes: none

## 2023-09-21 NOTE — Progress Notes (Signed)
 Office Visit Note  Patient: Jenny [name]             Date of Birth: 03/03/1946           MRN: [id number]             PCP: Cleave Curling, MD Referring: Cleave Curling, MD Visit Date: 10/05/2023 Occupation: @GUAROCC @  Subjective:  Pain in joints  History of Present Illness: Jenny [name] is a 78 y.o. female seen for the evaluation of joint pain and positive anti-CCP antibody.  Patient states her symptoms with neck pain and lower back pain started in 2014.  She was living in [place]  at the time and had C-spine fusion in 2014.  She had some reperfusion injury to her right hand which eventually recovered after physical therapy.  Her lower back pain got worse while she was living in [place] and underwent lumbar spine fusion in 2021 by Dr. Vaughn Georges.  She states back pain improved after the surgery.  Recently she has been sitting a lot and has been experiencing some discomfort in her lower back.  She states for the last 2 years she has been having discomfort in her both hands.  She is joined [employer] and goes for water exercises classes III times a week.  She states when she does hand exercises she feels discomfort especially over her CMC joints.  She has not noticed any joint swelling.  She has had problems with recurrent left trochanteric bursitis in the past which improved after cortisone injections.  Recently she has been having some discomfort in her left trochanteric bursa.  She also complains of discomfort in her knee joints for the last 6 months.  She has had problems with plantar fasciitis in the past which improved after wearing proper fitting shoes.  She reports some pedal edema.  There is no history of inflammatory arthritis.  She gives history of dry mouth and dry eyes.  There is no history of oral ulcers, nasal ulcers, malar rash, photosensitivity, Raynaud's or lymphadenopathy. She is retired.  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  She enjoys reading writing and online games.  She joined [employer] and has been exercising 3 times a week now.  She is married, right-handed, gravida 2, para 2.  She drinks alcohol occasionally.  She smoked 4 cigarettes a day for 2 years while she was in college.  She has not smoked since then.  Family history is positive for osteoarthritis in her parents and systemic lupus in her daughter    Activities of Daily Living:  Patient reports morning stiffness for 1-2 hours.   Patient Reports nocturnal pain.  Difficulty dressing/grooming: Reports Difficulty climbing stairs: Reports Difficulty getting out of chair: Reports Difficulty using hands for taps, buttons, cutlery, and/or writing: Reports  Review of Systems  Constitutional:  Positive for fatigue.  HENT:  Positive for mouth dryness. Negative for mouth sores.   Eyes:  Positive for dryness.  Respiratory:  Positive for shortness of breath.        On exertion   Cardiovascular:  Positive for swelling in legs/feet. Negative for chest pain and palpitations.  Gastrointestinal:  Negative for blood in stool, constipation and diarrhea.  Endocrine: Negative for increased urination.  Genitourinary:  Negative for involuntary urination.  Musculoskeletal:  Positive for joint pain, joint pain and morning stiffness. Negative for gait problem, joint swelling, myalgias, muscle weakness, muscle tenderness and myalgias.  Skin:  Positive for sensitivity to sunlight. Negative for color change, rash and hair loss.  Allergic/Immunologic: Negative for susceptible to infections.  Neurological:  Negative for dizziness and headaches.  Hematological:  Negative for swollen glands.  Psychiatric/Behavioral:  Positive for depressed mood and sleep disturbance. The patient is nervous/anxious.     PMFS History:  Patient Active Problem List   Diagnosis Date Noted   History of foot fracture 10/04/2023   Recurrent falls 10/04/2023   Imbalance 10/04/2023   Memory  changes 10/04/2023   Grief 04/06/2023   Adjustment insomnia 04/06/2023   Bilateral hand pain 04/06/2023   S/P lumbar fusion 04/11/2020   Pre-op evaluation 03/07/2020   Abnormal EKG 03/07/2020   Blood glucose abnormal 03/21/2019   Cardiovascular stress test abnormal 03/21/2019   Cervical disc disorder 03/21/2019   Disc disorder of lumbar region 03/21/2019   Depressive disorder 03/21/2019   Disorder of bone and articular cartilage 03/21/2019   Hyperlipidemia 03/21/2019   Impaired fasting glucose 03/21/2019   Mammogram abnormal 03/21/2019   Numbness of lower limb 03/21/2019   Osteopenia 03/21/2019   Seborrheic keratosis 03/21/2019   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   Varicose veins of lower extremity 03/21/2019   Vitamin D deficiency 03/21/2019   Spinal stenosis of lumbar region 01/20/2019   Bursitis of left hip 01/03/2019   Peripheral neuropathic pain 01/03/2019   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   Need for immunization against influenza 03/16/2018   Proteinuria 09/29/2017   Chronic kidney disease, 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   Overweight (BMI 25.0-29.9) 04/12/2017   Preop cardiovascular exam 12/17/2016   Prediabetes 12/17/2016   Obstructive sleep apnea syndrome 04/08/2015    Past Medical History:  Diagnosis Date   Allergy    Anemia    Anxiety    Arthritis    generalized   Bursitis of both hips    Cancer (HCC) 2010   Melanoma Left Arm   Cataract 2016,2017   Chronic kidney disease    Depression    Hyperlipidemia    Hypertension    Neuromuscular disorder (HCC)    Peripheral Nueropathy ( Left Side)   Plantar fasciitis    PONV (postoperative nausea and vomiting)    2014 (per pt, hard time waking up)   Preop cardiovascular exam 12/17/2016   Sleep apnea     Family History  Problem Relation Age of Onset   COPD Mother    Psychosis Mother    Anxiety disorder Mother    Depression Mother    COPD Father    Depression Father     Hypertension Father    Arthritis Father    Hearing loss Father    ADD / ADHD Brother    Other Brother        Leptospirosis   Healthy Brother    Depression Paternal Aunt    Heart disease Paternal Grandmother    Lupus Daughter    Sleep apnea Son    Past Surgical History:  Procedure Laterality Date   BACK SURGERY  05/04/2013   Cervical Discectomy with Fusion by Dr.McGovern   BREAST SURGERY  1980   fibroadenoma   CATARACT EXTRACTION Left 06/14/2015   CATARACT EXTRACTION Right 05/08/2015   CERVICAL SPINE SURGERY  2014   spinal fusion   EYE SURGERY Bilateral    Yag procedure both eyes   MELANOMA EXCISION Left 2010   left arm   TONSILLECTOMY     removed at age 4   TRANSFORAMINAL LUMBAR INTERBODY FUSION (TLIF)  WITH PEDICLE SCREW FIXATION 1 LEVEL N/A 04/11/2020   Procedure: TRANSFORAMINAL LUMBAR INTERBODY FUSION (TLIF) L5-S1, L4-5 LEFT LAMINOTOMY/ DECOMPRESSION;  Surgeon: Mort Ards, MD;  Location: [facility] OR;  Service: Orthopedics;  Laterality: N/A;  5 hrs   TUBAL LIGATION     Social History   Social History Narrative   Not on file   Immunization History  Administered Date(s) Administered   Fluad Quad(high Dose 65+) 03/02/2022, 04/29/2023   Influenza Split 03/28/2010, 02/10/2011, 05/11/2012   Influenza, High Dose Seasonal PF 02/05/2016, 04/06/2017, 03/16/2018   Influenza, Quadrivalent, Recombinant, Inj, Pf 03/20/2019   Influenza, Seasonal, Injecte, Preservative Fre 04/04/2013   Influenza,inj,Quad PF,6+ Mos 04/11/2014, 03/15/2015   PFIZER(Purple Top)SARS-COV-2 Vaccination 07/24/2019, 08/14/2019, 05/14/2020   Pfizer Covid-19 Vaccine Bivalent Booster 9yrs & up 03/17/2021, 04/07/2022   Pfizer(Comirnaty)Fall Seasonal Vaccine 12 years and older 05/28/2023   Pneumococcal Conjugate-13 07/13/2014   Pneumococcal Polysaccharide-23 02/05/2016   Pneumococcal-Unspecified 03/30/2008   RSV,unspecified 06/02/2022   Tdap 05/11/2011, 12/24/2022   Zoster, Live 04/08/2016      Objective: Vital Signs: BP 139/82 (BP Location: Right Arm, Patient Position: Sitting, Cuff Size: Normal)   Pulse 65   Resp 13   Ht 5' 4.25" (1.632 m)   Wt 167 lb 3.2 oz (75.8 kg)   BMI 28.48 kg/m    Physical Exam Vitals and nursing note reviewed.  Constitutional:      Appearance: She is well-developed.  HENT:     Head: Normocephalic and atraumatic.  Eyes:     Conjunctiva/sclera: Conjunctivae normal.  Cardiovascular:     Rate and Rhythm: Normal rate and regular rhythm.     Heart sounds: Normal heart sounds.  Pulmonary:     Effort: Pulmonary effort is normal.     Breath sounds: Normal breath sounds.  Abdominal:     General: Bowel sounds are normal.     Palpations: Abdomen is soft.  Musculoskeletal:     Cervical back: Normal range of motion.  Lymphadenopathy:     Cervical: No cervical adenopathy.  Skin:    General: Skin is warm and dry.     Capillary Refill: Capillary refill takes less than 2 seconds.  Neurological:     Mental Status: She is alert and oriented to person, place, and time.  Psychiatric:        Behavior: Behavior normal.      Musculoskeletal Exam: Patient had limited left rotation especially on the left side.  She had limited range of motion of her lumbar spine.  She has surgical scar from the cervical and lumbar spine fusion.  Shoulders, elbows, wrist joints were in good range of motion with no synovitis.  She had bilateral CMC PIP and DIP thickening with no synovitis.  There was no tenderness over MCPs or wrist joints.  Hip joints had some limitation with range of motion.  She had tenderness over left trochanteric bursa.  Knee joints with good range of motion with discomfort without any warmth swelling or effusion.  There was no tenderness over ankles.  Bilateral dorsal spurs were noted.  Mild PIP and DIP with thickening was noted in her feet.  CDAI Exam: CDAI Score: -- Patient Global: --; Provider Global: -- Swollen: --; Tender: -- Joint Exam 10/05/2023    No joint exam has been documented for this visit   There is currently no information documented on the homunculus. Go to the Rheumatology activity and complete the homunculus joint exam.  Investigation: No additional findings.  Imaging: No results found.  Recent  Labs: Lab Results  Component Value Date   WBC 5.3 10/04/2023   HGB 13.2 10/04/2023   PLT 255 10/04/2023   NA 141 10/04/2023   K 4.8 10/04/2023   CL 104 10/04/2023   CO2 24 10/04/2023   GLUCOSE 88 10/04/2023   BUN 18 10/04/2023   CREATININE 0.98 10/04/2023   BILITOT 0.4 10/04/2023   ALKPHOS 92 10/04/2023   AST 19 10/04/2023   ALT 13 10/04/2023   PROT 6.7 10/04/2023   ALBUMIN 4.4 10/04/2023   CALCIUM  9.3 10/04/2023   GFRAA 70 05/02/2020   April 29, 2023 RF negative, anti-CCP 29 (weak positive), uric acid 4.5, sed rate 11  Speciality Comments: No specialty comments available.  Procedures:  Large Joint Inj: L greater trochanter on 10/05/2023 11:25 AM Indications: pain Details: 27 G 1.5 in needle, lateral approach  Arthrogram: No  Medications: 40 mg triamcinolone acetonide 40 MG/ML; 1.5 mL lidocaine  1 % Aspirate: 0 mL Outcome: tolerated well, no immediate complications  Risk of infection, tendon injury, nerve injury, dermal atrophy and hypopigmentation were discussed. Procedure, treatment alternatives, risks and benefits explained, specific risks discussed. Consent was given by the patient. Immediately prior to procedure a time out was called to verify the correct patient, procedure, equipment, support staff and site/side marked as required. Patient was prepped and draped in the usual sterile fashion.     Allergies: Clindamycin, Prochlorperazine, Ibandronate, Raloxifene, and Diclofenac sodium   Assessment / Plan:     Visit Diagnoses: Pain in both hands -patient complains of pain and stiffness in her hands especially over the [facility] joints.  She had bilateral CMC thickening and PIP and DIP thickening.  No  synovitis was noted.  A prescription for bilateral CMC braces was given.  Joint protection was discussed.  Detailed counseling regarding osteoarthritis was provided.  A handout on hand exercises was given.  Plan: XR Hand 2 View Right, XR Hand 2 View Left.  X-ray showed bilateral CMC severe narrowing and subluxation.  PIP and DIP narrowing was noted.  Generalized osteopenia was noted.  Pain in left hip -she complains of discomfort in her left hip and left trochanteric region.  Patient recalls having left trochanteric bursa injection in the past.  She had tenderness over left trochanteric bursa.  Patient requested trochanteric bursa injection.  After side effects were discussed left trochanteric bursa was injected as described above.  She done the procedure well.  Postprocedure instructions were given.  A handout on IT band stretches was given.  Plan: XR HIP UNILAT W OR W/O PELVIS 2-3 VIEWS LEFT.  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  Degenerative changes were noted in the lumbar spine with postsurgical changes.  Chronic pain of both knees -she has been experiencing discomfort in her knee joints.  No warmth swelling or effusion was noted.  A handout on lower extremity exercises was given.  Plan: XR KNEE 3 VIEW RIGHT, XR KNEE 3 VIEW LEFT.  X-ray showed bilateral moderate chondromalacia patella.  Cervical disc disorder - Status post fusion 2014 in [place] .  She had limited lateral rotation without discomfort.  Disc disorder of lumbar region  S/P lumbar fusion - 04/11/20 by Dr. Vaughn Georges.  Patient reports having good recovery from back surgery after physical therapy.  Recently she has been having some lower back discomfort, prolonged sitting.  Temporomandibular joint disorder-she recalls having TMJ discomfort many years ago while she was on [place].  She denies any discomfort today.  Positive anti-CCP  test - Anti-CCP 29 (weak positive).  RF negative, sed  rate normal.  No synovitis was noted on the examination.  I advised patient to contact me if she develops any increased joint swelling.  Osteopenia of multiple sites - Evista and Boniva in the past.  She takes calcium  and vitamin D.  Patient reports having a DEXA scan recently.  I do not have results available.  Poor balance-patient gives history of poor balance.  Patient states that she was referred to physical therapy by Dr. Elnita Hai.  Vitamin D deficiency - 1000 units daily  Other medical problems are listed as follows:  Chronic kidney disease, stage II (mild) - She was followed by nephrologist in the past in [place] .  Prediabetes  Peripheral neuropathic pain  Pure hypercholesterolemia  Seborrheic keratosis  Depressive disorder  Obstructive sleep apnea syndrome - on CPAP.  Former smoker - 4 cigarettes/day for 2 years in college only.  Family history of systemic lupus erythematosus-daughter  Orders: Orders Placed This Encounter  Procedures   Large Joint Inj   XR Hand 2 View Right   XR Hand 2 View Left   XR HIP UNILAT W OR W/O PELVIS 2-3 VIEWS LEFT   XR KNEE 3 VIEW RIGHT   XR KNEE 3 VIEW LEFT   No orders of the defined types were placed in this encounter.   Face-to-face time spent with patient was 60 minutes. Greater than 50% of time was spent in counseling and coordination of care.  Follow-Up Instructions: Return for Polyarthralgia.   Nicholas Bari, MD  Note - This record has been created using Animal nutritionist.  Chart creation errors have been sought, but may not always  have been located. Such creation errors do not reflect on  the standard of medical care.

## 2023-09-25 ENCOUNTER — Other Ambulatory Visit: Payer: Self-pay | Admitting: Internal Medicine

## 2023-10-04 ENCOUNTER — Encounter: Payer: Self-pay | Admitting: Internal Medicine

## 2023-10-04 ENCOUNTER — Ambulatory Visit (INDEPENDENT_AMBULATORY_CARE_PROVIDER_SITE_OTHER): Payer: Medicare Other | Admitting: Internal Medicine

## 2023-10-04 VITALS — BP 110/80 | HR 99 | Temp 98.4°F | Ht 64.0 in | Wt 169.2 lb

## 2023-10-04 DIAGNOSIS — Z8781 Personal history of (healed) traumatic fracture: Secondary | ICD-10-CM | POA: Insufficient documentation

## 2023-10-04 DIAGNOSIS — F4321 Adjustment disorder with depressed mood: Secondary | ICD-10-CM

## 2023-10-04 DIAGNOSIS — N182 Chronic kidney disease, stage 2 (mild): Secondary | ICD-10-CM | POA: Diagnosis not present

## 2023-10-04 DIAGNOSIS — R296 Repeated falls: Secondary | ICD-10-CM | POA: Insufficient documentation

## 2023-10-04 DIAGNOSIS — R413 Other amnesia: Secondary | ICD-10-CM | POA: Insufficient documentation

## 2023-10-04 DIAGNOSIS — R2689 Other abnormalities of gait and mobility: Secondary | ICD-10-CM | POA: Insufficient documentation

## 2023-10-04 DIAGNOSIS — R7303 Prediabetes: Secondary | ICD-10-CM | POA: Diagnosis not present

## 2023-10-04 DIAGNOSIS — E7800 Pure hypercholesterolemia, unspecified: Secondary | ICD-10-CM

## 2023-10-04 DIAGNOSIS — E559 Vitamin D deficiency, unspecified: Secondary | ICD-10-CM

## 2023-10-04 MED ORDER — CYANOCOBALAMIN 1000 MCG/ML IJ SOLN
1000.0000 ug | Freq: Once | INTRAMUSCULAR | Status: AC
  Administered 2023-10-04: 1000 ug via INTRAMUSCULAR

## 2023-10-04 NOTE — Patient Instructions (Signed)
 Cholesterol Content in Foods Cholesterol is a waxy, fat-like substance that helps to carry fat in the blood. The body needs cholesterol in small amounts, but too much cholesterol can cause damage to the arteries and heart. What foods have cholesterol?  Cholesterol is found in animal-based foods, such as meat, seafood, and dairy. Generally, low-fat dairy and lean meats have less cholesterol than full-fat dairy and fatty meats. The milligrams of cholesterol per serving (mg per serving) of common cholesterol-containing foods are listed below. Meats and other proteins Egg -- one large whole egg has 186 mg. Veal shank -- 4 oz (113 g) has 141 mg. Lean ground [place] (93% lean) -- 4 oz (113 g) has 118 mg. Fat-trimmed lamb loin -- 4 oz (113 g) has 106 mg. Lean ground beef (90% lean) -- 4 oz (113 g) has 100 mg. Lobster -- 3.5 oz (99 g) has 90 mg. Pork loin chops -- 4 oz (113 g) has 86 mg. Canned salmon -- 3.5 oz (99 g) has 83 mg. Fat-trimmed beef top loin -- 4 oz (113 g) has 78 mg. Frankfurter -- 1 frank (3.5 oz or 99 g) has 77 mg. Crab -- 3.5 oz (99 g) has 71 mg. Roasted chicken without skin, white meat -- 4 oz (113 g) has 66 mg. Light bologna -- 2 oz (57 g) has 45 mg. Deli-cut [place] -- 2 oz (57 g) has 31 mg. Canned tuna -- 3.5 oz (99 g) has 31 mg. Tomasa Blase -- 1 oz (28 g) has 29 mg. Oysters and mussels (raw) -- 3.5 oz (99 g) has 25 mg. Mackerel -- 1 oz (28 g) has 22 mg. Trout -- 1 oz (28 g) has 20 mg. Pork sausage -- 1 link (1 oz or 28 g) has 17 mg. Salmon -- 1 oz (28 g) has 16 mg. Tilapia -- 1 oz (28 g) has 14 mg. Dairy Soft-serve ice cream --  cup (4 oz or 86 g) has 103 mg. Whole-milk yogurt -- 1 cup (8 oz or 245 g) has 29 mg. Cheddar cheese -- 1 oz (28 g) has 28 mg. American cheese -- 1 oz (28 g) has 28 mg. Whole milk -- 1 cup (8 oz or 250 mL) has 23 mg. 2% milk -- 1 cup (8 oz or 250 mL) has 18 mg. Cream cheese -- 1 tablespoon (Tbsp) (14.5 g) has 15 mg. Cottage cheese --  cup (4 oz or  113 g) has 14 mg. Low-fat (1%) milk -- 1 cup (8 oz or 250 mL) has 10 mg. Sour cream -- 1 Tbsp (12 g) has 8.5 mg. Low-fat yogurt -- 1 cup (8 oz or 245 g) has 8 mg. Nonfat Greek yogurt -- 1 cup (8 oz or 228 g) has 7 mg. Half-and-half cream -- 1 Tbsp (15 mL) has 5 mg. Fats and oils Cod liver oil -- 1 tablespoon (Tbsp) (13.6 g) has 82 mg. Butter -- 1 Tbsp (14 g) has 15 mg. Lard -- 1 Tbsp (12.8 g) has 14 mg. Bacon grease -- 1 Tbsp (12.9 g) has 14 mg. Mayonnaise -- 1 Tbsp (13.8 g) has 5-10 mg. Margarine -- 1 Tbsp (14 g) has 3-10 mg. The items listed above may not be a complete list of foods with cholesterol. Exact amounts of cholesterol in these foods may vary depending on specific ingredients and brands. Contact a dietitian for more information. What foods do not have cholesterol? Most plant-based foods do not have cholesterol unless you combine them with a food that has  cholesterol. Foods without cholesterol include: Grains and cereals. Vegetables. Fruits. Vegetable oils, such as olive, canola, and sunflower oil. Legumes, such as peas, beans, and lentils. Nuts and seeds. Egg whites. The items listed above may not be a complete list of foods that do not have cholesterol. Contact a dietitian for more information. Summary The body needs cholesterol in small amounts, but too much cholesterol can cause damage to the arteries and heart. Cholesterol is found in animal-based foods, such as meat, seafood, and dairy. Generally, low-fat dairy and lean meats have less cholesterol than full-fat dairy and fatty meats. This information is not intended to replace advice given to you by your health care provider. Make sure you discuss any questions you have with your health care provider. Document Revised: 09/20/2020 Document Reviewed: 09/20/2020 Elsevier Patient Education  2024 [employer].

## 2023-10-04 NOTE — Assessment & Plan Note (Signed)
 Previous labs reviewed, her A1c has been elevated in the past. I will check an A1c today. Reminded to avoid refined sugars including sugary drinks/foods and processed meats including bacon, sausages and deli meats.

## 2023-10-04 NOTE — Assessment & Plan Note (Signed)
 Chronic, she is encouraged to keep BP well controlled and to stay well hydrated to decrease risk of CKD progression.  Advised to avoid NSAIDs as well.

## 2023-10-04 NOTE — Assessment & Plan Note (Addendum)
 Memory issues with difficulty recalling phone numbers. Differential includes vitamin B12 deficiency and stress from bereavements. Passed memory test but reports subjective concerns. -6CIT performed, normal results - Check vitamin B12 level. - Check thyroid function. - Discuss potential impact of stress and grief on memory. - Consider B12 supplementation if deficiency is confirmed.

## 2023-10-04 NOTE — Progress Notes (Signed)
 I,Victoria T Basil Lim, CMA,acting as a Neurosurgeon for Smiley Dung, MD.,have documented all relevant documentation on the behalf of Smiley Dung, MD,as directed by  Smiley Dung, MD while in the presence of Smiley Dung, MD.  Subjective:  Patient ID: Jenny [name] , female    DOB: [birth year] , 78 y.o.   MRN: [id number]  Chief Complaint  Patient presents with   Hyperlipidemia    Patient presents today for cholesterol & prediabetes follow up. She reports compliance with medications. She would like a referral for PT for balance. She has fallen twice in the past 6 months. She also notices having an unsteady walk.  She states also not remembering lots of things. For example she was asked what her phone number was. She could not remember & had to look it up.     HPI Discussed the use of AI scribe software for clinical note transcription with the patient, who gave verbal consent to proceed.  History of Present Illness Jenny [name] is a 78 year old female who presents for a cholesterol check and evaluation of unsteady gait and memory issues.  She has been experiencing unsteadiness and balance issues over the past six months, resulting in two falls. One fall occurred in April while cleaning, where she fell backwards without dizziness, and another fall resulted in a broken foot after tripping on concrete, which she attributes to neuropathy. She uses a transport wheelchair for stability and often needs to hold onto her husband or a cart when walking.  She has a history of memory issues, noting difficulty recalling her phone number on two occasions this week. She passed a recent memory test but is concerned about her memory, especially in light of recent stress from the passing of three friends, all of whom had cancer.  She is currently taking paroxetine  and atorvastatin , and occasionally uses Benadryl for sleep disturbances, though she reports improved sleep recently. She is consistent with  her vitamin D supplementation and denies using over-the-counter reflux or heartburn medications.   Hyperlipidemia This is a chronic problem. The current episode started more than 1 year ago. The problem is controlled. She has no history of chronic renal disease or hypothyroidism. Pertinent negatives include no chest pain, focal weakness or shortness of breath. Current antihyperlipidemic treatment includes statins. The current treatment provides moderate improvement of lipids. Risk factors for coronary artery disease include dyslipidemia and post-menopausal.     Past Medical History:  Diagnosis Date   Allergy    Anemia    Anxiety    Arthritis    generalized   Bursitis of both hips    Cancer (HCC) 2010   Melanoma Left Arm   Cataract 2016,2017   Chronic kidney disease    Depression    Hyperlipidemia    Hypertension    Neuromuscular disorder (HCC)    Peripheral Nueropathy ( Left Side)   Plantar fasciitis    PONV (postoperative nausea and vomiting)    2014 (per pt, hard time waking up)   Preop cardiovascular exam 12/17/2016   Sleep apnea      Family History  Problem Relation Age of Onset   COPD Mother    Psychosis Mother    Anxiety disorder Mother    Depression Mother    COPD Father    Depression Father    Hypertension Father    Arthritis Father    Hearing loss Father    Lupus Daughter    Depression Paternal Aunt  Heart disease Paternal Grandmother    ADD / ADHD Brother      Current Outpatient Medications:    Alpha-D-Galactosidase (BEANO PO), Take by mouth. prn, Disp: , Rfl:    atorvastatin  (LIPITOR) 10 MG tablet, TAKE 1 TABLET BY MOUTH EVERY DAY, Disp: 90 tablet, Rfl: 2   Calcium -Vitamin D-Vitamin K (CALCIUM  SOFT CHEWS PO), Take by mouth., Disp: , Rfl:    Cholecalciferol (VITAMIN D-3) 25 MCG (1000 UT) CAPS, Take by mouth., Disp: , Rfl:    diphenhydrAMINE (BENADRYL) 25 mg capsule, Take 25 mg by mouth at bedtime as needed for sleep. , Disp: , Rfl:    Lactase  (LACTAID PO), Take by mouth. prn, Disp: , Rfl:    PARoxetine  (PAXIL ) 30 MG tablet, Take 1 tablet (30 mg total) by mouth daily., Disp: 90 tablet, Rfl: 2   Allergies  Allergen Reactions   Clindamycin Hives, Itching and Rash   Prochlorperazine Palpitations    Rapid heart rate, insomnia, made her "super hyper"   Ibandronate     Lower Jaw Pain   Raloxifene     Lower Jaw Pain   Diclofenac Sodium Other (See Comments)    Turns feces white   Penicillin V Potassium Rash     Review of Systems  Constitutional: Negative.   Respiratory: Negative.  Negative for shortness of breath.   Cardiovascular: Negative.  Negative for chest pain.  Gastrointestinal: Negative.   Neurological: Negative.  Negative for focal weakness.       She c/o memory issues.   Psychiatric/Behavioral: Negative.       Today's Vitals   10/04/23 0941  BP: 110/80  Pulse: 99  Temp: 98.4 F (36.9 C)  SpO2: 98%  Weight: 169 lb 3.2 oz (76.7 kg)   Body mass index is 29.04 kg/m.  Wt Readings from Last 3 Encounters:  10/04/23 169 lb 3.2 oz (76.7 kg)  09/30/22 173 lb 12.8 oz (78.8 kg)  06/25/22 170 lb (77.1 kg)     Objective:  Physical Exam Vitals and nursing note reviewed.  Constitutional:      Appearance: Normal appearance.  HENT:     Head: Normocephalic and atraumatic.  Eyes:     Extraocular Movements: Extraocular movements intact.  Cardiovascular:     Rate and Rhythm: Normal rate and regular rhythm.     Heart sounds: Normal heart sounds.  Pulmonary:     Effort: Pulmonary effort is normal.     Breath sounds: Normal breath sounds.  Musculoskeletal:     Cervical back: Normal range of motion.  Skin:    General: Skin is warm.  Neurological:     General: No focal deficit present.     Mental Status: She is alert.  Psychiatric:        Mood and Affect: Mood normal.        Behavior: Behavior normal.         Assessment And Plan:  Pure hypercholesterolemia Assessment & Plan: Chronic, currently on  atorvastatin  10mg  daily. Encouraged to follow heart healthy lifestyle.   Orders: -     CBC -     CMP14+EGFR -     Lipid panel -     TSH  Chronic kidney disease, stage II (mild) Assessment & Plan: Chronic, she is encouraged to keep BP well controlled and to stay well hydrated to decrease risk of CKD progression.  Advised to avoid NSAIDs as well.     Prediabetes Assessment & Plan: Previous labs reviewed, her A1c has been elevated  in the past. I will check an A1c today. Reminded to avoid refined sugars including sugary drinks/foods and processed meats including bacon, sausages and deli meats.    Orders: -     CMP14+EGFR -     Hemoglobin A1c  Memory changes Assessment & Plan: Memory issues with difficulty recalling phone numbers. Differential includes vitamin B12 deficiency and stress from bereavements. Passed memory test but reports subjective concerns. -6CIT performed, normal results - Check vitamin B12 level. - Check thyroid function. - Discuss potential impact of stress and grief on memory. - Consider B12 supplementation if deficiency is confirmed.   Recurrent falls Assessment & Plan: - Refer to physical therapy for strengthening and balance training.  Orders: -     Ambulatory referral to Physical Therapy  Imbalance Assessment & Plan: Suspected vitamin B12 deficiency as a cause of unsteady gait and memory issues. Discussed B12's role in neurological function and risks of untreated deficiency. - Check vitamin B12 level. - Administer B12 injection today if levels are low. - Plan for weekly B12 injections for four weeks if deficiency is confirmed. - Discuss dietary sources of B12 and absorption issues.  Orders: -     Vitamin B12 -     Cyanocobalamin -     Ambulatory referral to Physical Therapy  Vitamin D deficiency Assessment & Plan: I WILL CHECK A VIT D LEVEL AND SUPPLEMENT AS NEEDED.  ALSO ENCOURAGED TO SPEND 15 MINUTES IN THE SUN DAILY.    Orders: -      VITAMIN D 25 Hydroxy (Vit-D Deficiency, Fractures)  Grief Assessment & Plan: Reflecting on mortality and loss of friends, impacting mental health. Prefers to stay near her son and not yet open to grief counseling. - Discuss grief counseling options. - Will consider in the future if needed   History of foot fracture Assessment & Plan: Foot fracture from fall due to peripheral neuropathy and unsteady gait. Reports recovery. - Request bone density scan from [facility] in [place], [place] .    Return for 6 month chol f/u. Aaron Aas  Patient was given opportunity to ask questions. Patient verbalized understanding of the plan and was able to repeat key elements of the plan. All questions were answered to their satisfaction.   I, Smiley Dung, MD, have reviewed all documentation for this visit. The documentation on 10/04/23 for the exam, diagnosis, procedures, and orders are all accurate and complete.   IF YOU HAVE BEEN REFERRED TO A SPECIALIST, IT MAY TAKE 1-2 WEEKS TO SCHEDULE/PROCESS THE REFERRAL. IF YOU HAVE NOT HEARD FROM US /SPECIALIST IN TWO WEEKS, PLEASE GIVE US  A CALL AT [phone] X [phone].   THE PATIENT IS ENCOURAGED TO PRACTICE SOCIAL DISTANCING DUE TO THE COVID-19 PANDEMIC.

## 2023-10-04 NOTE — Assessment & Plan Note (Signed)
 Chronic, currently on atorvastatin 10mg  daily. Encouraged to follow heart healthy lifestyle.

## 2023-10-04 NOTE — Assessment & Plan Note (Signed)
 Reflecting on mortality and loss of friends, impacting mental health. Prefers to stay near her son and not yet open to grief counseling. - Discuss grief counseling options. - Will consider in the future if needed

## 2023-10-04 NOTE — Assessment & Plan Note (Signed)
 I WILL CHECK A VIT D LEVEL AND SUPPLEMENT AS NEEDED.  ALSO ENCOURAGED TO SPEND 15 MINUTES IN THE SUN DAILY.

## 2023-10-04 NOTE — Assessment & Plan Note (Signed)
 Suspected vitamin B12 deficiency as a cause of unsteady gait and memory issues. Discussed B12's role in neurological function and risks of untreated deficiency. - Check vitamin B12 level. - Administer B12 injection today if levels are low. - Plan for weekly B12 injections for four weeks if deficiency is confirmed. - Discuss dietary sources of B12 and absorption issues.

## 2023-10-04 NOTE — Assessment & Plan Note (Signed)
-   Refer to physical therapy for strengthening and balance training.

## 2023-10-04 NOTE — Assessment & Plan Note (Signed)
 Foot fracture from fall due to peripheral neuropathy and unsteady gait. Reports recovery. - Request bone density scan from [facility] in [place], [place] .

## 2023-10-05 ENCOUNTER — Ambulatory Visit: Payer: Self-pay | Admitting: Internal Medicine

## 2023-10-05 ENCOUNTER — Ambulatory Visit: Payer: Medicare Other | Attending: Rheumatology | Admitting: Rheumatology

## 2023-10-05 ENCOUNTER — Ambulatory Visit

## 2023-10-05 ENCOUNTER — Encounter: Payer: Self-pay | Admitting: Rheumatology

## 2023-10-05 ENCOUNTER — Ambulatory Visit (INDEPENDENT_AMBULATORY_CARE_PROVIDER_SITE_OTHER)

## 2023-10-05 VITALS — BP 139/82 | HR 65 | Resp 13 | Ht 64.25 in | Wt 167.2 lb

## 2023-10-05 DIAGNOSIS — M25562 Pain in left knee: Secondary | ICD-10-CM | POA: Diagnosis present

## 2023-10-05 DIAGNOSIS — Z87891 Personal history of nicotine dependence: Secondary | ICD-10-CM | POA: Diagnosis present

## 2023-10-05 DIAGNOSIS — M5090 Cervical disc disorder, unspecified, unspecified cervical region: Secondary | ICD-10-CM

## 2023-10-05 DIAGNOSIS — M79641 Pain in right hand: Secondary | ICD-10-CM

## 2023-10-05 DIAGNOSIS — R7303 Prediabetes: Secondary | ICD-10-CM | POA: Diagnosis present

## 2023-10-05 DIAGNOSIS — M79642 Pain in left hand: Secondary | ICD-10-CM

## 2023-10-05 DIAGNOSIS — G8929 Other chronic pain: Secondary | ICD-10-CM

## 2023-10-05 DIAGNOSIS — G4733 Obstructive sleep apnea (adult) (pediatric): Secondary | ICD-10-CM

## 2023-10-05 DIAGNOSIS — N182 Chronic kidney disease, stage 2 (mild): Secondary | ICD-10-CM | POA: Diagnosis present

## 2023-10-05 DIAGNOSIS — M8589 Other specified disorders of bone density and structure, multiple sites: Secondary | ICD-10-CM | POA: Diagnosis present

## 2023-10-05 DIAGNOSIS — L821 Other seborrheic keratosis: Secondary | ICD-10-CM | POA: Diagnosis present

## 2023-10-05 DIAGNOSIS — Z8269 Family history of other diseases of the musculoskeletal system and connective tissue: Secondary | ICD-10-CM

## 2023-10-05 DIAGNOSIS — M25552 Pain in left hip: Secondary | ICD-10-CM

## 2023-10-05 DIAGNOSIS — E7800 Pure hypercholesterolemia, unspecified: Secondary | ICD-10-CM | POA: Diagnosis present

## 2023-10-05 DIAGNOSIS — R768 Other specified abnormal immunological findings in serum: Secondary | ICD-10-CM | POA: Diagnosis present

## 2023-10-05 DIAGNOSIS — M25561 Pain in right knee: Secondary | ICD-10-CM

## 2023-10-05 DIAGNOSIS — M792 Neuralgia and neuritis, unspecified: Secondary | ICD-10-CM | POA: Diagnosis present

## 2023-10-05 DIAGNOSIS — R2689 Other abnormalities of gait and mobility: Secondary | ICD-10-CM | POA: Diagnosis present

## 2023-10-05 DIAGNOSIS — F32A Depression, unspecified: Secondary | ICD-10-CM | POA: Diagnosis present

## 2023-10-05 DIAGNOSIS — M519 Unspecified thoracic, thoracolumbar and lumbosacral intervertebral disc disorder: Secondary | ICD-10-CM | POA: Diagnosis present

## 2023-10-05 DIAGNOSIS — E559 Vitamin D deficiency, unspecified: Secondary | ICD-10-CM | POA: Diagnosis present

## 2023-10-05 DIAGNOSIS — Z981 Arthrodesis status: Secondary | ICD-10-CM

## 2023-10-05 DIAGNOSIS — M26609 Unspecified temporomandibular joint disorder, unspecified side: Secondary | ICD-10-CM | POA: Diagnosis present

## 2023-10-05 LAB — LIPID PANEL
Chol/HDL Ratio: 2.5 ratio (ref 0.0–4.4)
Cholesterol, Total: 177 mg/dL (ref 100–199)
HDL: 70 mg/dL (ref >39)
LDL Chol Calc (NIH): 89 mg/dL (ref 0–99)
Triglycerides: 103 mg/dL (ref 0–149)
VLDL Cholesterol Cal: 18 mg/dL (ref 5–40)

## 2023-10-05 LAB — CMP14+EGFR
ALT: 13 IU/L (ref 0–32)
AST: 19 IU/L (ref 0–40)
Albumin: 4.4 g/dL (ref 3.8–4.8)
Alkaline Phosphatase: 92 IU/L (ref 44–121)
BUN/Creatinine Ratio: 18 (ref 12–28)
BUN: 18 mg/dL (ref 8–27)
Bilirubin Total: 0.4 mg/dL (ref 0.0–1.2)
CO2: 24 mmol/L (ref 20–29)
Calcium: 9.3 mg/dL (ref 8.7–10.3)
Chloride: 104 mmol/L (ref 96–106)
Creatinine, Ser: 0.98 mg/dL (ref 0.57–1.00)
Globulin, Total: 2.3 g/dL (ref 1.5–4.5)
Glucose: 88 mg/dL (ref 70–99)
Potassium: 4.8 mmol/L (ref 3.5–5.2)
Sodium: 141 mmol/L (ref 134–144)
Total Protein: 6.7 g/dL (ref 6.0–8.5)
eGFR: 59 mL/min/{1.73_m2} — ABNORMAL LOW (ref >59)

## 2023-10-05 LAB — CBC
Hematocrit: 41.2 % (ref 34.0–46.6)
Hemoglobin: 13.2 g/dL (ref 11.1–15.9)
MCH: 30.5 pg (ref 26.6–33.0)
MCHC: 32 g/dL (ref 31.5–35.7)
MCV: 95 fL (ref 79–97)
Platelets: 255 10*3/uL (ref 150–450)
RBC: 4.33 x10E6/uL (ref 3.77–5.28)
RDW: 13.2 % (ref 11.7–15.4)
WBC: 5.3 10*3/uL (ref 3.4–10.8)

## 2023-10-05 LAB — TSH: TSH: 1.94 u[IU]/mL (ref 0.450–4.500)

## 2023-10-05 LAB — VITAMIN D 25 HYDROXY (VIT D DEFICIENCY, FRACTURES): Vit D, 25-Hydroxy: 40.8 ng/mL (ref 30.0–100.0)

## 2023-10-05 LAB — HEMOGLOBIN A1C
Est. average glucose Bld gHb Est-mCnc: 126 mg/dL
Hgb A1c MFr Bld: 6 % — ABNORMAL HIGH (ref 4.8–5.6)

## 2023-10-05 LAB — VITAMIN B12: Vitamin B-12: 360 pg/mL (ref 232–1245)

## 2023-10-05 MED ORDER — LIDOCAINE HCL 1 % IJ SOLN
1.5000 mL | INTRAMUSCULAR | Status: AC | PRN
  Administered 2023-10-05: 1.5 mL

## 2023-10-05 MED ORDER — TRIAMCINOLONE ACETONIDE 40 MG/ML IJ SUSP
40.0000 mg | INTRAMUSCULAR | Status: AC | PRN
  Administered 2023-10-05: 40 mg via INTRA_ARTICULAR

## 2023-10-05 NOTE — Patient Instructions (Addendum)
 Iliotibial Band Syndrome Rehab Ask your health care provider which exercises are safe for you. Do exercises exactly as told by your provider and adjust them as told. It's normal to feel mild stretching, pulling, tightness, or discomfort as you do these exercises. Stop right away if you feel sudden pain or your pain gets a lot worse. Do not begin these exercises until told by your provider. Stretching and range-of-motion exercises These exercises warm up your muscles and joints. They also improve the movement and flexibility of your hip and pelvis. Quadriceps stretch, prone  Lie face down (prone) on a firm surface like a bed or padded floor. Bend your left / right knee. Reach back to hold your ankle or pant leg. If you can't reach your ankle or pant leg, use a belt looped around your foot and grab the belt instead. Gently pull your heel toward your butt. Your knee should not slide out to the side. You should feel a stretch in the front of your thigh and knee, also called the quadriceps. Hold this position for __________ seconds. Repeat __________ times. Complete this exercise __________ times a day. Iliotibial band stretch The iliotibial band is a strip of tissue that runs along the outside of your hip down to your knee. Lie on your side with your left / right leg on top. Bend both knees and grab your left / right ankle. Stretch out your bottom arm to help you balance. Slowly bring your top knee back so your thigh goes behind your back. Slowly lower your top leg toward the floor until you feel a gentle stretch on the outside of your left / right hip and thigh. If you don't feel a stretch and your knee won't go farther, place the heel of your other foot on top of your knee and pull your knee down toward the floor with your foot. Hold this position for __________ seconds. Repeat __________ times. Complete this exercise __________ times a day. Strengthening exercises These exercises build strength  and endurance in your hip and pelvis. Endurance means your muscles can keep working even when they're tired. Straight leg raises, side-lying This exercise strengthens the muscles that rotate the leg at the hip and move it away from your body. These muscles are called hip abductors. Lie on your side with your left / right leg on top. Lie so your head, shoulder, hip, and knee line up. You can bend your bottom knee to help you balance. Roll your hips slightly forward so they're stacked directly over each other. Your left / right knee should face forward. Tense the muscles in your outer thigh and hip. Lift your top leg 4-6 inches (10-15 cm) off the ground. Hold this position for __________ seconds. Slowly lower your leg back down to the starting position. Let your muscles fully relax before doing this exercise again. Repeat __________ times. Complete this exercise __________ times a day. Leg raises, prone This exercise strengthens the muscles that move the hips backward. These muscles are called hip extensors. Lie face down (prone) on your bed or a firm surface. You can put a pillow under your hips for comfort and to support your lower back. Bend your left / right knee so your foot points straight up toward the ceiling. Keep the other leg straight and behind you. Squeeze your butt muscles. Lift your left / right thigh off the firm surface. Do not let your back arch. Tense your thigh muscle as hard as you can without having  more knee pain. Hold this position for __________ seconds. Slowly lower your leg to the starting position. Allow your leg to relax all the way. Repeat __________ times. Complete this exercise __________ times a day. Hip hike  Stand sideways on a bottom step. Place your feet so that your left / right leg is on the step, and the other foot is hanging off the side. If you need support for balance, hold onto a railing or wall. Keep your knees straight and your abdomen square,  meaning your hips are level. Then, lift your left / right hip up toward the ceiling. Slowly let your leg that's hanging off the step lower towards the floor. Your foot should get closer to the ground. Do not lean or bend your knees during this movement. Repeat __________ times. Complete this exercise __________ times a day. This information is not intended to replace advice given to you by your health care provider. Make sure you discuss any questions you have with your health care provider. Document Revised: 07/24/2022 Document Reviewed: 07/24/2022 [employer] Patient Education  2024 [employer]  Exercises for Chronic Knee Pain Chronic knee pain is pain that lasts longer than 3 months. For most people with chronic knee pain, exercise and weight loss is an important part of treatment. Your health care provider may want you to focus on: Making the muscles that support your knee stronger. This can take pressure off your knee and reduce pain. Preventing knee stiffness. How far you can move your knee, keeping it there or making it farther. Losing weight (if this applies) to take pressure off your knee, lower your risk for injury, and make it easier for you to exercise. Your provider will help you make an exercise program that fits your needs and physical abilities. Below are simple, low-impact exercises you can do at home. Ask your provider or physical therapist how often you should do your exercise program and how many times to repeat each exercise. General safety tips  Get your provider's approval before doing any exercises. Start slowly and stop any time you feel pain. Do not exercise if your knee pain is flaring up. Warm up first. Stretching a cold muscle can cause an injury. Do 5-10 minutes of easy movement or light stretching before beginning your exercises. Do 5-10 minutes of low-impact activity (like walking or cycling) before starting strengthening exercises. Contact your provider any time  you have pain during or after exercising. Exercise can cause discomfort but should not be painful. It is normal to be a little stiff or sore after exercising. Stretching and range-of-motion exercises Front thigh stretch  Stand up straight and support your body by holding on to a chair or resting one hand on a wall. With your legs straight and close together, bend one knee to lift your heel up toward your butt. Using one hand for support, grab your ankle with your free hand. Pull your foot up closer toward your butt to feel the stretch in front of your thigh. Hold the stretch for 30 seconds. Repeat __________ times. Complete this exercise __________ times a day. Back thigh stretch  Sit on the floor with your back straight and your legs out straight in front of you. Place the palms of your hands on the floor and slide them toward your feet as you bend at the hip. Try to touch your nose to your knees and feel the stretch in the back of your thighs. Hold for 30 seconds. Repeat __________ times. Complete  this exercise __________ times a day. Calf stretch  Stand facing a wall. Place the palms of your hands flat against the wall, arms extended, and lean slightly against the wall. Get into a lunge position with one leg bent at the knee and the other leg stretched out straight behind you. Keep both feet facing the wall and increase the bend in your knee while keeping the heel of the other leg flat on the ground. You should feel the stretch in your calf. Hold for 30 seconds. Repeat __________ times. Complete this exercise __________ times a day. Strengthening exercises Straight leg lift  Lie on your back with one knee bent and the other leg out straight. Slowly lift the straight leg without bending the knee. Lift until your foot is about 12 inches (30 cm) off the floor. Hold for 3-5 seconds and slowly lower your leg. Repeat __________ times. Complete this exercise __________ times a  day. Single leg dip  Stand between two chairs and put both hands on the backs of the chairs for support. Extend one leg out straight with your body weight resting on the heel of the standing leg. Slowly bend your standing knee to dip your body to the level that is comfortable for you. Hold for 3-5 seconds. Repeat __________ times. Complete this exercise __________ times a day. Hamstring curls  Stand straight, knees close together, facing the back of a chair. Hold on to the back of a chair with both hands. Keep one leg straight. Bend the other knee while bringing the heel up toward the butt until the knee is bent at a 90-degree angle (right angle). Hold for 3-5 seconds. Repeat __________ times. Complete this exercise __________ times a day. Wall squat  Stand straight with your back, hips, and head against a wall. Step forward one foot at a time with your back still against the wall. Your feet should be 2 feet (61 cm) from the wall at shoulder width. Keeping your back, hips, and head against the wall, slide down the wall to as close to a sitting position as you can get. Hold for 5-10 seconds, then slowly slide back up. Repeat __________ times. Complete this exercise __________ times a day. Step-ups  Stand in front of a sturdy platform or stool that is about 6 inches (15 cm) high. Slowly step up with your left / right foot, keeping your knee in line with your hip and foot. Do not let your knee bend so far that you cannot see your toes. Hold on to a chair for balance, but do not use it for support. Slowly unlock your knee and lower yourself to the starting position. Repeat __________ times. Complete this exercise __________ times a day. Contact a health care provider if: Your exercises cause pain. Your pain is worse after you exercise. Your pain prevents you from doing your exercises. This information is not intended to replace advice given to you by your health care provider. Make sure  you discuss any questions you have with your health care provider. Document Revised: 05/26/2022 Document Reviewed: 05/26/2022 Else[employer] Patient Education  2024 [employer] Hand Exercises Hand exercises can be helpful for almost anyone. They can strengthen your hands and improve flexibility and movement. The exercises can also increase blood flow to the hands. These results can make your work and daily tasks easier for you. Hand exercises can be especially helpful for people who have joint pain from arthritis or nerve damage from using their hands over and  over. These exercises can also help people who injure a hand. Exercises Most of these hand exercises are gentle stretching and motion exercises. It is usually safe to do them often throughout the day. Warming up your hands before exercise may help reduce stiffness. You can do this with gentle massage or by placing your hands in warm water for 10-15 minutes. It is normal to feel some stretching, pulling, tightness, or mild discomfort when you begin new exercises. In time, this will improve. Remember to always be careful and stop right away if you feel sudden, very bad pain or your pain gets worse. You want to get better and be safe. Ask your health care provider which exercises are safe for you. Do exercises exactly as told by your provider and adjust them as told. Do not begin these exercises until told by your provider. Knuckle bend or "claw" fist  Stand or sit with your arm, hand, and all five fingers pointed straight up. Make sure to keep your wrist straight. Gently bend your fingers down toward your palm until the tips of your fingers are touching your palm. Keep your big knuckle straight and only bend the small knuckles in your fingers. Hold this position for 10 seconds. Straighten your fingers back to your starting position. Repeat this exercise 5-10 times with each hand. Full finger fist  Stand or sit with your arm, hand, and all five  fingers pointed straight up. Make sure to keep your wrist straight. Gently bend your fingers into your palm until the tips of your fingers are touching the middle of your palm. Hold this position for 10 seconds. Extend your fingers back to your starting position, stretching every joint fully. Repeat this exercise 5-10 times with each hand. Straight fist  Stand or sit with your arm, hand, and all five fingers pointed straight up. Make sure to keep your wrist straight. Gently bend your fingers at the big knuckle, where your fingers meet your hand, and at the middle knuckle. Keep the knuckle at the tips of your fingers straight and try to touch the bottom of your palm. Hold this position for 10 seconds. Extend your fingers back to your starting position, stretching every joint fully. Repeat this exercise 5-10 times with each hand. Tabletop  Stand or sit with your arm, hand, and all five fingers pointed straight up. Make sure to keep your wrist straight. Gently bend your fingers at the big knuckle, where your fingers meet your hand, as far down as you can. Keep the small knuckles in your fingers straight. Think of forming a tabletop with your fingers. Hold this position for 10 seconds. Extend your fingers back to your starting position, stretching every joint fully. Repeat this exercise 5-10 times with each hand. Finger spread  Place your hand flat on a table with your palm facing down. Make sure your wrist stays straight. Spread your fingers and thumb apart from each other as far as you can until you feel a gentle stretch. Hold this position for 10 seconds. Bring your fingers and thumb tight together again. Hold this position for 10 seconds. Repeat this exercise 5-10 times with each hand. Making circles  Stand or sit with your arm, hand, and all five fingers pointed straight up. Make sure to keep your wrist straight. Make a circle by touching the tip of your thumb to the tip of your index  finger. Hold for 10 seconds. Then open your hand wide. Repeat this motion with your thumb and  each of your fingers. Repeat this exercise 5-10 times with each hand. Thumb motion  Sit with your forearm resting on a table and your wrist straight. Your thumb should be facing up toward the ceiling. Keep your fingers relaxed as you move your thumb. Lift your thumb up as high as you can toward the ceiling. Hold for 10 seconds. Bend your thumb across your palm as far as you can, reaching the tip of your thumb for the small finger (pinkie) side of your palm. Hold for 10 seconds. Repeat this exercise 5-10 times with each hand. Grip strengthening  Hold a stress ball or other soft ball in the middle of your hand. Slowly increase the pressure, squeezing the ball as much as you can without causing pain. Think of bringing the tips of your fingers into the middle of your palm. All of your finger joints should bend when doing this exercise. Hold your squeeze for 10 seconds, then relax. Repeat this exercise 5-10 times with each hand. Contact a health care provider if: Your hand pain or discomfort gets much worse when you do an exercise. Your hand pain or discomfort does not improve within 2 hours after you exercise. If you have either of these problems, stop doing these exercises right away. Do not do them again unless your provider says that you can. Get help right away if: You develop sudden, severe hand pain or swelling. If this happens, stop doing these exercises right away. Do not do them again unless your provider says that you can. This information is not intended to replace advice given to you by your health care provider. Make sure you discuss any questions you have with your health care provider. Document Revised: 05/26/2022 Document Reviewed: 05/26/2022 Elsevier Patient Education  2024 [employer]  Osteoarthritis  Osteoarthritis is a type of arthritis. It refers to joint pain or joint  disease. Osteoarthritis affects tissue that covers the ends of bones in joints (cartilage). Cartilage acts as a cushion between the bones and helps them move smoothly. Osteoarthritis occurs when cartilage in the joints gets worn down. Osteoarthritis is sometimes called "wear and tear" arthritis. Osteoarthritis is the most common form of arthritis. It often occurs in older people. It is a condition that gets worse over time. The joints most often affected by this condition are in the fingers, toes, hips, knees, and spine, including the neck and lower back. What are the causes? This condition is caused by the wearing down of cartilage that covers the ends of bones. What increases the risk? The following factors may make you more likely to develop this condition: Being age 80 or older. Obesity. Overuse of joints. Past injury of a joint. Past surgery on a joint. Family history of osteoarthritis. What are the signs or symptoms? The main symptoms of this condition are pain, swelling, and stiffness in the joint. Other symptoms may include: An enlarged joint. More pain and further damage caused by small pieces of bone or cartilage that break off and float inside of the joint. Small deposits of bone (osteophytes) that grow on the edges of the joint. A grating or scraping feeling inside the joint when you move it. Popping or creaking sounds when you move. Difficulty walking or exercising. An inability to grip items, twist your hand, or control the movements of your hands and fingers. How is this diagnosed? This condition may be diagnosed based on: Your medical history. A physical exam. Your symptoms. X-rays of the affected joints. Blood tests  to rule out other types of arthritis. How is this treated? There is no cure for this condition, but treatment can help control pain and improve joint function. Treatment may include a combination of therapies, such as: Pain relief techniques, such  as: Applying heat and cold to the joint. Massage. A form of talk therapy called cognitive behavioral therapy (CBT). This therapy helps you set goals and follow up on the changes that you make. Medicines for pain and inflammation. The medicines can be taken by mouth or applied to the skin. They include: NSAIDs, such as ibuprofen. Prescription medicines. Strong anti-inflammatory medicines (corticosteroids). Certain nutritional supplements. A prescribed exercise program. You may work with a physical therapist. Assistive devices, such as a brace, wrap, splint, specialized glove, or cane. A weight control plan. Surgery, such as: An osteotomy. This is done to reposition the bones and relieve pain or to remove loose pieces of bone and cartilage. Joint replacement surgery. You may need this surgery if you have advanced osteoarthritis. Follow these instructions at home: Activity Rest your affected joints as told by your health care provider. Exercise as told by your provider. The provider may recommend specific types of exercise, such as: Strengthening exercises. These are done to strengthen the muscles that support joints affected by arthritis. Aerobic activities. These are exercises, such as brisk walking or water aerobics, that increase your heart rate. Range-of-motion activities. These help your joints move more easily. Balance and agility exercises. Managing pain, stiffness, and swelling     If told, apply heat to the affected area as often as told by your provider. Use the heat source that your provider recommends, such as a moist heat pack or a heating pad. If you have a removable assistive device, remove it as told by your provider. Place a towel between your skin and the heat source. If your provider tells you to keep the assistive device on while you apply heat, place a towel between the assistive device and the heat source. Leave the heat on for 20-30 minutes. If told, put ice on the  affected area. If you have a removable assistive device, remove it as told by your provider. Put ice in a plastic bag. Place a towel between your skin and the bag. If your provider tells you to keep the assistive device on during icing, place a towel between the assistive device and the bag. Leave the ice on for 20 minutes, 2-3 times a day. If your skin turns bright red, remove the ice or heat right away to prevent skin damage. The risk of damage is higher if you cannot feel pain, heat, or cold. Move your fingers or toes often to reduce stiffness and swelling. Raise (elevate) the affected area above the level of your heart while you are sitting or lying down. General instructions Take over-the-counter and prescription medicines only as told by your provider. Maintain a healthy weight. Follow instructions from your provider for weight control. Do not use any products that contain nicotine or tobacco. These products include cigarettes, chewing tobacco, and vaping devices, such as e-cigarettes. If you need help quitting, ask your provider. Use assistive devices as told by your provider. Where to find more information [employer] of Arthritis and Musculoskeletal and Skin Diseases: niams.http://www.myers.net/ [employer] on Aging: BaseRingTones.pl [employer] of Rheumatology: rheumatology.org Contact a health care provider if: You have redness, swelling, or a feeling of warmth in a joint that gets worse. You have a fever along with joint or muscle aches.  You develop a rash. You have trouble doing your normal activities. You have pain that gets worse and is not relieved by pain medicine. This information is not intended to replace advice given to you by your health care provider. Make sure you discuss any questions you have with your health care provider. Document Revised: 01/08/2022 Document Reviewed: 01/08/2022 Elsevier Patient Education  2024 [employer].

## 2023-10-10 ENCOUNTER — Ambulatory Visit: Payer: Self-pay | Admitting: Internal Medicine

## 2023-10-10 DIAGNOSIS — R296 Repeated falls: Secondary | ICD-10-CM

## 2023-10-22 NOTE — Progress Notes (Signed)
 Office Visit Note  Patient: Jenny [name]             Date of Birth: Sep 17, 1945           MRN: [id number]             PCP: Cleave Curling, MD Referring: Cleave Curling, MD Visit Date: 11/05/2023 Occupation: @GUAROCC @  Subjective:  Pain in multiple joints  History of Present Illness: Jenny [name] is a 78 y.o. female with osteoarthritis and degenerative disc disease.  She returns today after her initial visit on Oct 05, 2023.  She states she had a very good response to left trochanteric bursa injection.  The pain has almost resolved.  She continues to have some stiffness in her hands.  She has been using right CMC brace which has been very helpful.  She is also going to physical therapy for fall prevention and has noted benefit in her gait.  She continues to go to water aerobics on a regular basis.  She has not noticed any joint swelling.    Activities of Daily Living:  Patient reports morning stiffness for 2 hours.   Patient Reports nocturnal pain.  Difficulty dressing/grooming: Denies Difficulty climbing stairs: Reports Difficulty getting out of chair: Denies Difficulty using hands for taps, buttons, cutlery, and/or writing: Reports  Review of Systems  Constitutional:  Positive for fatigue.  HENT:  Positive for mouth sores and mouth dryness.   Eyes:  Positive for dryness.  Respiratory:  Negative for shortness of breath.   Cardiovascular:  Negative for chest pain and palpitations.  Gastrointestinal:  Negative for blood in stool, constipation and diarrhea.  Endocrine: Positive for increased urination.  Genitourinary:  Negative for involuntary urination.  Musculoskeletal:  Positive for joint pain, gait problem, joint pain and morning stiffness. Negative for joint swelling, myalgias, muscle weakness, muscle tenderness and myalgias.  Skin:  Positive for sensitivity to sunlight. Negative for color change, rash and hair loss.  Allergic/Immunologic: Negative for susceptible to  infections.  Neurological:  Negative for dizziness and headaches.  Hematological:  Negative for swollen glands.  Psychiatric/Behavioral:  Positive for depressed mood. Negative for sleep disturbance. The patient is nervous/anxious.     PMFS History:  Patient Active Problem List   Diagnosis Date Noted   History of foot fracture 10/04/2023   Recurrent falls 10/04/2023   Imbalance 10/04/2023   Memory changes 10/04/2023   Grief 04/06/2023   Adjustment insomnia 04/06/2023   Bilateral hand pain 04/06/2023   S/P lumbar fusion 04/11/2020   Pre-op evaluation 03/07/2020   Abnormal EKG 03/07/2020   Blood glucose abnormal 03/21/2019   Cardiovascular stress test abnormal 03/21/2019   Cervical disc disorder 03/21/2019   Disc disorder of lumbar region 03/21/2019   Depressive disorder 03/21/2019   Disorder of bone and articular cartilage 03/21/2019   Hyperlipidemia 03/21/2019   Impaired fasting glucose 03/21/2019   Mammogram abnormal 03/21/2019   Numbness of lower limb 03/21/2019   Osteopenia 03/21/2019   Seborrheic keratosis 03/21/2019   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   Varicose veins of lower extremity 03/21/2019   Vitamin D  deficiency 03/21/2019   Spinal stenosis of lumbar region 01/20/2019   Bursitis of left hip 01/03/2019   Peripheral neuropathic pain 01/03/2019   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   Need for immunization against influenza 03/16/2018   Proteinuria 09/29/2017   Chronic kidney disease, 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   Overweight (BMI 25.0-29.9) 04/12/2017   Preop cardiovascular exam 12/17/2016   Prediabetes 12/17/2016  Obstructive sleep apnea syndrome 04/08/2015    Past Medical History:  Diagnosis Date   Allergy    Anemia    Anxiety    Arthritis    generalized   Bursitis of both hips    Cancer (HCC) 2010   Melanoma Left Arm   Cataract 2016,2017   Chronic kidney disease    Depression    Hyperlipidemia    Hypertension     Neuromuscular disorder (HCC)    Peripheral Nueropathy ( Left Side)   Plantar fasciitis    PONV (postoperative nausea and vomiting)    2014 (per pt, hard time waking up)   Preop cardiovascular exam 12/17/2016   Sleep apnea     Family History  Problem Relation Age of Onset   COPD Mother    Psychosis Mother    Anxiety disorder Mother    Depression Mother    COPD Father    Depression Father    Hypertension Father    Arthritis Father    Hearing loss Father    ADD / ADHD Brother    Other Brother        Leptospirosis   Healthy Brother    Depression Paternal Aunt    Heart disease Paternal Grandmother    Lupus Daughter    Sleep apnea Son    Past Surgical History:  Procedure Laterality Date   BACK SURGERY  05/04/2013   Cervical Discectomy with Fusion by Dr.McGovern   BREAST SURGERY  1980   fibroadenoma   CATARACT EXTRACTION Left 06/14/2015   CATARACT EXTRACTION Right 05/08/2015   CERVICAL SPINE SURGERY  2014   spinal fusion   EYE SURGERY Bilateral    Yag procedure both eyes   MELANOMA EXCISION Left 2010   left arm   TONSILLECTOMY     removed at age 75   TRANSFORAMINAL LUMBAR INTERBODY FUSION (TLIF) WITH PEDICLE SCREW FIXATION 1 LEVEL N/A 04/11/2020   Procedure: TRANSFORAMINAL LUMBAR INTERBODY FUSION (TLIF) L5-S1, L4-5 LEFT LAMINOTOMY/ DECOMPRESSION;  Surgeon: Mort Ards, MD;  Location: [facility] OR;  Service: Orthopedics;  Laterality: N/A;  5 hrs   TUBAL LIGATION     Social History   Social History Narrative   Not on file   Immunization History  Administered Date(s) Administered   Fluad Quad(high Dose 65+) 03/02/2022, 04/29/2023   Influenza Split 03/28/2010, 02/10/2011, 05/11/2012   Influenza, High Dose Seasonal PF 02/05/2016, 04/06/2017, 03/16/2018   Influenza, Quadrivalent, Recombinant, Inj, Pf 03/20/2019   Influenza, Seasonal, Injecte, Preservative Fre 04/04/2013   Influenza,inj,Quad PF,6+ Mos 04/11/2014, 03/15/2015   PFIZER(Purple Top)SARS-COV-2 Vaccination  07/24/2019, 08/14/2019, 05/14/2020   Pfizer Covid-19 Vaccine Bivalent Booster 33yrs & up 03/17/2021, 04/07/2022   Pfizer(Comirnaty)Fall Seasonal Vaccine 12 years and older 05/28/2023   Pneumococcal Conjugate-13 07/13/2014   Pneumococcal Polysaccharide-23 02/05/2016   Pneumococcal-Unspecified 03/30/2008   RSV,unspecified 06/02/2022   Tdap 05/11/2011, 12/24/2022   Zoster, Live 04/08/2016     Objective: Vital Signs: BP 104/70 (BP Location: Left Arm, Patient Position: Sitting, Cuff Size: Normal)   Pulse 73   Resp 17   Ht 5' 4.5 (1.638 m)   Wt 166 lb (75.3 kg)   BMI 28.05 kg/m    Physical Exam Vitals and nursing note reviewed.  Constitutional:      Appearance: She is well-developed.  HENT:     Head: Normocephalic and atraumatic.   Eyes:     Conjunctiva/sclera: Conjunctivae normal.    Cardiovascular:     Rate and Rhythm: Normal rate and regular rhythm.  Heart sounds: Normal heart sounds.  Pulmonary:     Effort: Pulmonary effort is normal.     Breath sounds: Normal breath sounds.  Abdominal:     General: Bowel sounds are normal.     Palpations: Abdomen is soft.   Musculoskeletal:     Cervical back: Normal range of motion.  Lymphadenopathy:     Cervical: No cervical adenopathy.   Skin:    General: Skin is warm and dry.     Capillary Refill: Capillary refill takes less than 2 seconds.   Neurological:     Mental Status: She is alert and oriented to person, place, and time.   Psychiatric:        Behavior: Behavior normal.      Musculoskeletal Exam: She had limited lateral rotation of her cervical spine.  She had limited range of motion of her lumbar spine.  She has surgical scar on the cervical and lumbar spine to fusion.  Shoulders, elbows, wrist joints with good range of motion.  Bilateral CMC, PIP and DIP thickening but no synovitis was noted.  She has some limitation range of motion of bilateral hip joints.  She had mild tenderness over left trochanteric bursa.   Knee joints with good range of motion without any warmth swelling or effusion.  There was no tenderness over ankles or MTPs.  CDAI Exam: CDAI Score: -- Patient Global: --; Provider Global: -- Swollen: --; Tender: -- Joint Exam 11/05/2023   No joint exam has been documented for this visit   There is currently no information documented on the homunculus. Go to the Rheumatology activity and complete the homunculus joint exam.  Investigation: No additional findings.  Imaging: No results found.   Recent Labs: Lab Results  Component Value Date   WBC 5.3 10/04/2023   HGB 13.2 10/04/2023   PLT 255 10/04/2023   NA 141 10/04/2023   K 4.8 10/04/2023   CL 104 10/04/2023   CO2 24 10/04/2023   GLUCOSE 88 10/04/2023   BUN 18 10/04/2023   CREATININE 0.98 10/04/2023   BILITOT 0.4 10/04/2023   ALKPHOS 92 10/04/2023   AST 19 10/04/2023   ALT 13 10/04/2023   PROT 6.7 10/04/2023   ALBUMIN 4.4 10/04/2023   CALCIUM  9.3 10/04/2023   GFRAA 70 05/02/2020    April 29, 2023 RF negative, anti-CCP 29 (weak positive), uric acid 4.5, sed rate 11    Speciality Comments: No specialty comments available.  Procedures:  No procedures performed Allergies: Clindamycin, Prochlorperazine, Ibandronate, Raloxifene, and Diclofenac sodium   Assessment / Plan:     Visit Diagnoses: Primary osteoarthritis of both hands -she continues to have pain and stiffness in her hands.  Clinical and radiographic findings suggestive of osteoarthritis.  X-ray findings were reviewed with the patient.  She been using right CMC brace which has been very helpful.  Joint protection muscle strengthening was discussed.  A handout on hand exercises was given.  Primary osteoarthritis of both hips - Moderate osteoarthritis of the right hip joint and mild osteoarthritis of the left hip joint was noted on the x-rays.  X-ray findings were reviewed with the patient.  She had limited range of motion of her hip joints without  discomfort..  Trochanteric bursitis of both hips - Left trochanteric bursa was injected at the last visit.  She had very good response to trochanteric bursa injection.  She states the pain has almost resolved.  IT band stretches were discussed and a handout was given.  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  X-ray findings were reviewed with the patient.  She continues to have some discomfort in her knee joints.  No warmth swelling or effusion was noted.  A handout on lower extremity muscle strengthening sizing was given.  DDD (degenerative disc disease), cervical - S/p fusion 2014 in [place] .  She continues to have limited range of motion.  Stretch exercises were demonstrated in the office.  Disc disorder of lumbar region - Status post fusion November 2021 by Dr. Vaughn Georges.  She had improvement.  She has been having some discomfort in her lower back.  A handout on lower back exercises was given.  Positive anti-CCP test - Anti-CCP 29 weak positive.  RF negative sed rate normal.  No synovitis noted on the examination.  I advised her to contact me if she develops any joint swelling.  Osteopenia of multiple sites - Patient tried Evista and Boniva in the past.  November 17, 2022 T-score -1.9, BMD 0.642, -2.4% left femoral neck.  DEXA scan results were reviewed with the patient.  Use of calcium  rich diet vitamin D  was advised.  She has been doing water aerobics and exercising on a regular basis.  Poor balance - Patient was referred to physical therapy by Dr. Elnita Hai.  Patient has noticed improvement in her gait after going to physical therapy.  Vitamin D  deficiency - She is on 1000 units daily.  Chronic kidney disease, stage II (mild) - Followed by nephrology in [place]  in the past.  Other medical problems are listed as follows:  Prediabetes  Peripheral neuropathic pain  Pure hypercholesterolemia  Depressive disorder  Seborrheic  keratosis  Obstructive sleep apnea syndrome  Former smoker  Family history of systemic lupus erythematosus-daughter  Orders: No orders of the defined types were placed in this encounter.  No orders of the defined types were placed in this encounter.   Follow-Up Instructions: Return in about 6 months (around 05/06/2024) for Osteoarthritis.   Nicholas Bari, MD  Note - This record has been created using Animal nutritionist.  Chart creation errors have been sought, but may not always  have been located. Such creation errors do not reflect on  the standard of medical care.

## 2023-10-27 ENCOUNTER — Ambulatory Visit: Attending: Internal Medicine | Admitting: Physical Therapy

## 2023-10-27 ENCOUNTER — Encounter: Payer: Self-pay | Admitting: Physical Therapy

## 2023-10-27 ENCOUNTER — Other Ambulatory Visit: Payer: Self-pay

## 2023-10-27 DIAGNOSIS — R2689 Other abnormalities of gait and mobility: Secondary | ICD-10-CM | POA: Diagnosis present

## 2023-10-27 DIAGNOSIS — R2681 Unsteadiness on feet: Secondary | ICD-10-CM | POA: Diagnosis present

## 2023-10-27 DIAGNOSIS — R296 Repeated falls: Secondary | ICD-10-CM | POA: Insufficient documentation

## 2023-10-27 DIAGNOSIS — M6281 Muscle weakness (generalized): Secondary | ICD-10-CM | POA: Diagnosis present

## 2023-10-27 NOTE — Therapy (Signed)
 OUTPATIENT PHYSICAL THERAPY NEURO EVALUATION   Patient Name: Jenny [name] MRN: [id number] DOB:1945/10/25, 78 y.o., female Today's Date: 10/27/2023   PCP: Cleave Curling, MD  REFERRING PROVIDER: Cleave Curling, MD   END OF SESSION:  PT End of Session - 10/27/23 1713     Visit Number 1    Number of Visits 13    Date for PT Re-Evaluation 12/10/23    Authorization Type Medicare/BCBS    Progress Note Due on Visit 10    PT Start Time 1620    PT Stop Time 1702    PT Time Calculation (min) 42 min    Equipment Utilized During Treatment Gait belt    Activity Tolerance Patient tolerated treatment well    Behavior During Therapy WFL for tasks assessed/performed             Past Medical History:  Diagnosis Date   Allergy    Anemia    Anxiety    Arthritis    generalized   Bursitis of both hips    Cancer (HCC) 2010   Melanoma Left Arm   Cataract 2016,2017   Chronic kidney disease    Depression    Hyperlipidemia    Hypertension    Neuromuscular disorder (HCC)    Peripheral Nueropathy ( Left Side)   Plantar fasciitis    PONV (postoperative nausea and vomiting)    2014 (per pt, hard time waking up)   Preop cardiovascular exam 12/17/2016   Sleep apnea    Past Surgical History:  Procedure Laterality Date   BACK SURGERY  05/04/2013   Cervical Discectomy with Fusion by Dr.McGovern   BREAST SURGERY  1980   fibroadenoma   CATARACT EXTRACTION Left 06/14/2015   CATARACT EXTRACTION Right 05/08/2015   CERVICAL SPINE SURGERY  2014   spinal fusion   EYE SURGERY Bilateral    Yag procedure both eyes   MELANOMA EXCISION Left 2010   left arm   TONSILLECTOMY     removed at age 63   TRANSFORAMINAL LUMBAR INTERBODY FUSION (TLIF) WITH PEDICLE SCREW FIXATION 1 LEVEL N/A 04/11/2020   Procedure: TRANSFORAMINAL LUMBAR INTERBODY FUSION (TLIF) L5-S1, L4-5 LEFT LAMINOTOMY/ DECOMPRESSION;  Surgeon: Mort Ards, MD;  Location: [facility] OR;  Service: Orthopedics;  Laterality: N/A;  5 hrs    TUBAL LIGATION     Patient Active Problem List   Diagnosis Date Noted   History of foot fracture 10/04/2023   Recurrent falls 10/04/2023   Imbalance 10/04/2023   Memory changes 10/04/2023   Grief 04/06/2023   Adjustment insomnia 04/06/2023   Bilateral hand pain 04/06/2023   S/P lumbar fusion 04/11/2020   Pre-op evaluation 03/07/2020   Abnormal EKG 03/07/2020   Blood glucose abnormal 03/21/2019   Cardiovascular stress test abnormal 03/21/2019   Cervical disc disorder 03/21/2019   Disc disorder of lumbar region 03/21/2019   Depressive disorder 03/21/2019   Disorder of bone and articular cartilage 03/21/2019   Hyperlipidemia 03/21/2019   Impaired fasting glucose 03/21/2019   Mammogram abnormal 03/21/2019   Numbness of lower limb 03/21/2019   Osteopenia 03/21/2019   Seborrheic keratosis 03/21/2019   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   Varicose veins of lower extremity 03/21/2019   Vitamin D  deficiency 03/21/2019   Spinal stenosis of lumbar region 01/20/2019   Bursitis of left hip 01/03/2019   Peripheral neuropathic pain 01/03/2019   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   Need for immunization against influenza 03/16/2018   Proteinuria 09/29/2017   Chronic kidney disease, stage II (mild)  09/29/2017   Overweight (BMI 25.0-29.9) 04/12/2017   Preop cardiovascular exam 12/17/2016   Prediabetes 12/17/2016   Obstructive sleep apnea syndrome 04/08/2015    ONSET DATE: 10/04/2023 (MD referral)  REFERRING DIAG:  R29.6 (ICD-10-CM) - Recurrent falls  R26.89 (ICD-10-CM) - Imbalance    THERAPY DIAG:  Unsteadiness on feet  Other abnormalities of gait and mobility  Muscle weakness (generalized)  Rationale for Evaluation and Treatment: Rehabilitation  SUBJECTIVE:                                                                                                                                                                                              SUBJECTIVE STATEMENT: Had back surgery in 2021 and have been going to arthritis swim class.  Have slowed down and not done as much exercise, slowed down and have had several falls.  I don't feel confident walking-so I use husband's arm, or use a cart at stores. Pt accompanied by: self  PERTINENT HISTORY: plantar fascitis, back pain, hx of back surgery, neck surgery, neuropathy on LLE, falls with 5th metatarsal fracture, vertigo  PAIN:  Are you having pain? Yes: NPRS scale: 2/10 up to 7-8/10 Pain location: LLE Pain description: neuropathy pain Aggravating factors: exercise classes Relieving factors: time  PRECAUTIONS: Fall  RED FLAGS: None   WEIGHT BEARING RESTRICTIONS: No  FALLS: Has patient fallen in last 6 months? Yes. Number of falls 2  LIVING ENVIRONMENT: Lives with: lives with their spouse and splits time between [place] and [place]  Lives in: House/apartment Stairs: 4 steps to enter and one level in [place]; flight of steps to bedroom in [place] Has following equipment at home: bilateral walking poles  PLOF: Independent  PATIENT GOALS: To try to prevent future falls  OBJECTIVE:  Note: Objective measures were completed at Evaluation unless otherwise noted.  DIAGNOSTIC FINDINGS: NA for this episode  COGNITION: Overall cognitive status: Within functional limits for tasks assessed   SENSATION: Light touch: WFL per patient report  POSTURE: rounded shoulders and forward head  LOWER EXTREMITY ROM:     Active  Right Eval Left Eval  Hip flexion    Hip extension    Hip abduction    Hip adduction    Hip internal rotation    Hip external rotation    Knee flexion    Knee extension  -25  Ankle dorsiflexion 15 5  Ankle plantarflexion    Ankle inversion    Ankle eversion     (Blank rows = not tested)  LOWER EXTREMITY MMT:    MMT Right Eval Left Eval  Hip flexion 5 4+  Hip extension  Hip abduction    Hip adduction    Hip internal rotation    Hip external rotation     Knee flexion 5 4  Knee extension 5 4  Ankle dorsiflexion 3+ 3+  Ankle plantarflexion    Ankle inversion    Ankle eversion    (Blank rows = not tested)  TRANSFERS: Sit to stand: SBA  Assistive device utilized: None     Stand to sit: SBA  Assistive device utilized: slowed pace, prefers to use UE support and None      GAIT: Findings: Gait Characteristics: step through pattern, decreased step length- Left, decreased ankle dorsiflexion- Left, and poor foot clearance- Left, Distance walked: 50 ft, Assistive device utilized:None, Level of assistance: SBA, and Comments: reports decreased knowledge of where L foot is with gait  FUNCTIONAL TESTS:  5 times sit to stand: 25.97 sec hands at knees Timed up and go (TUG): 13.72 10 meter walk test: 13.5 sec = 2.43 ft/sec   M-CTSIB  Condition 1: Firm Surface, EO 30 Sec, Normal Sway  Condition 2: Firm Surface, EC 30 Sec, Moderate Sway  Condition 3: Foam Surface, EO 30 Sec, Moderate Sway  Condition 4: Foam Surface, EC 28.25 Sec, Severe Sway                                                                                                                                TREATMENT DATE: 10/27/2023    PATIENT EDUCATION: Education details: Eval results, POC Person educated: Patient Education method: Explanation Education comprehension: verbalized understanding  HOME EXERCISE PROGRAM: Access Code: [email or web address] URL: [email or web address] Date: 10/27/2023 Prepared by: [facility] - Outpatient  Rehab - [facility]  Exercises - Heel Toe Raises with Counter Support  - 1-2 x daily - 7 x weekly - 2 sets - 10 reps - Romberg Stance with Head Rotation  - 1-2 x daily - 7 x weekly - 1 sets - 5-10 reps - Romberg Stance with Head Nods  - 1-2 x daily - 7 x weekly - 1 sets - 5-10 reps  GOALS: Goals reviewed with patient? Yes  SHORT TERM GOALS: Target date: 11/25/2023  Pt will be independent with HEP for improved strength, balance,  gait. Baseline: Goal status: INITIAL  2.  Pt will improve 5x sit<>stand to less than or equal to 18 sec to demonstrate improved functional strength and transfer efficiency. Baseline: 25.97 sec Goal status: INITIAL  3.  Pt will improve Condition 4 on MCTSIB to 30 sec mod sway or less for improved balance. Baseline: 28 sec severe sway Goal status: INITIAL  LONG TERM GOALS: Target date: 12/10/2023  Pt will be independent with HEP for improved strength, balance, gait. Baseline:  Goal status: INITIAL  2.  Pt will improve 5x sit<>stand to less than or equal to 15 sec to demonstrate improved functional strength and transfer efficiency. Baseline: 25.97 sec Goal status: INITIAL  3.  Pt will improve gait velocity to  at least 2.62 ft/sec for improved gait efficiency and safety. Baseline: 2.4 ft/sec Goal status: INITIAL  4.  Pt will improve DGI score to at least 19/24 to decrease fall risk. Baseline: TBA Goal status: INITIAL   ASSESSMENT:  CLINICAL IMPRESSION: Patient is a 78 y.o. female who was seen today for physical therapy evaluation and treatment for imbalance, falls. She reports at least 2 falls in the past 6-7 months, with decreased balance confidence, decreased activity level and slowing down from participation in community exercise classes.  She has history of lumbar fusion and neck surgery (2021 and prior) and reports LLE neuropathy.  She presents today with decreased lower extremity strength, (LLE is weaker), decreased flexibility, decreased balance, decreased timing and coordination of gait.  She is at increased fall risk per FTSTS, TUG scores and is limited community ambulator per gait velocity score.  She demo decreased vestibular system use for balance on MCTSIB test.  She will benefit from skilled PT to address the above stated deficits to decrease fall risk and improve overall functional mobility.   OBJECTIVE IMPAIRMENTS: Abnormal gait, decreased balance, decreased knowledge  of use of DME, decreased mobility, difficulty walking, decreased ROM, decreased strength, and impaired flexibility.   ACTIVITY LIMITATIONS: standing, transfers, and locomotion level  PARTICIPATION LIMITATIONS: shopping, community activity, and community fitness and travel  PERSONAL FACTORS: 3+ comorbidities: see PMH are also affecting patient's functional outcome.   REHAB POTENTIAL: Good  CLINICAL DECISION MAKING: Evolving/moderate complexity  EVALUATION COMPLEXITY: Moderate  PLAN:  PT FREQUENCY: 2x/week  PT DURATION: 6 weeks plus eval visit  PLANNED INTERVENTIONS: 97750- Physical Performance Testing, 97110-Therapeutic exercises, 97530- Therapeutic activity, 97112- Neuromuscular re-education, [phone]- Self Care, [id number]- Gait training, Patient/Family education, and Balance training  PLAN FOR NEXT SESSION: Assess DGI, review initial HEP and progress HEP for LLE strength and for balance, multi-sensory balance   Cambrea Kirt W., PT 10/27/2023, 5:14 PM  [facility] [address] [place], [place] [address] Phone # [phone] Fax # [phone]

## 2023-11-01 NOTE — Therapy (Signed)
 OUTPATIENT PHYSICAL THERAPY NEURO TREATMENT   Patient Name: Jenny [name] MRN: [id number] DOB:[birth year], 78 y.o., female Today's Date: 11/01/2023   PCP: Cleave Curling, MD  REFERRING PROVIDER: Cleave Curling, MD   END OF SESSION:    Past Medical History:  Diagnosis Date   Allergy    Anemia    Anxiety    Arthritis    generalized   Bursitis of both hips    Cancer (HCC) 2010   Melanoma Left Arm   Cataract 2016,2017   Chronic kidney disease    Depression    Hyperlipidemia    Hypertension    Neuromuscular disorder (HCC)    Peripheral Nueropathy ( Left Side)   Plantar fasciitis    PONV (postoperative nausea and vomiting)    2014 (per pt, hard time waking up)   Preop cardiovascular exam 12/17/2016   Sleep apnea    Past Surgical History:  Procedure Laterality Date   BACK SURGERY  05/04/2013   Cervical Discectomy with Fusion by Dr.McGovern   BREAST SURGERY  1980   fibroadenoma   CATARACT EXTRACTION Left 06/14/2015   CATARACT EXTRACTION Right 05/08/2015   CERVICAL SPINE SURGERY  2014   spinal fusion   EYE SURGERY Bilateral    Yag procedure both eyes   MELANOMA EXCISION Left 2010   left arm   TONSILLECTOMY     removed at age 72   TRANSFORAMINAL LUMBAR INTERBODY FUSION (TLIF) WITH PEDICLE SCREW FIXATION 1 LEVEL N/A 04/11/2020   Procedure: TRANSFORAMINAL LUMBAR INTERBODY FUSION (TLIF) L5-S1, L4-5 LEFT LAMINOTOMY/ DECOMPRESSION;  Surgeon: Mort Ards, MD;  Location: [facility] OR;  Service: Orthopedics;  Laterality: N/A;  5 hrs   TUBAL LIGATION     Patient Active Problem List   Diagnosis Date Noted   History of foot fracture 10/04/2023   Recurrent falls 10/04/2023   Imbalance 10/04/2023   Memory changes 10/04/2023   Grief 04/06/2023   Adjustment insomnia 04/06/2023   Bilateral hand pain 04/06/2023   S/P lumbar fusion 04/11/2020   Pre-op evaluation 03/07/2020   Abnormal EKG 03/07/2020   Blood glucose abnormal 03/21/2019   Cardiovascular stress test abnormal  03/21/2019   Cervical disc disorder 03/21/2019   Disc disorder of lumbar region 03/21/2019   Depressive disorder 03/21/2019   Disorder of bone and articular cartilage 03/21/2019   Hyperlipidemia 03/21/2019   Impaired fasting glucose 03/21/2019   Mammogram abnormal 03/21/2019   Numbness of lower limb 03/21/2019   Osteopenia 03/21/2019   Seborrheic keratosis 03/21/2019   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   Varicose veins of lower extremity 03/21/2019   Vitamin D  deficiency 03/21/2019   Spinal stenosis of lumbar region 01/20/2019   Bursitis of left hip 01/03/2019   Peripheral neuropathic pain 01/03/2019   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   Need for immunization against influenza 03/16/2018   Proteinuria 09/29/2017   Chronic kidney disease, 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   Overweight (BMI 25.0-29.9) 04/12/2017   Preop cardiovascular exam 12/17/2016   Prediabetes 12/17/2016   Obstructive sleep apnea syndrome 04/08/2015    ONSET DATE: 10/04/2023 (MD referral)  REFERRING DIAG:  R29.6 (ICD-10-CM) - Recurrent falls  R26.89 (ICD-10-CM) - Imbalance    THERAPY DIAG:  No diagnosis found.  Rationale for Evaluation and Treatment: Rehabilitation  SUBJECTIVE:  SUBJECTIVE STATEMENT: Had back surgery in 2021 and have been going to arthritis swim class.  Have slowed down and not done as much exercise, slowed down and have had several falls.  I don't feel confident walking-so I use husband's arm, or use a cart at stores. Pt accompanied by: self  PERTINENT HISTORY: plantar fascitis, back pain, hx of back surgery, neck surgery, neuropathy on LLE, falls with 5th metatarsal fracture, vertigo  PAIN:  Are you having pain? Yes: NPRS scale: 2/10 up to 7-8/10 Pain location: LLE Pain  description: neuropathy pain Aggravating factors: exercise classes Relieving factors: time  PRECAUTIONS: Fall  RED FLAGS: None   WEIGHT BEARING RESTRICTIONS: No  FALLS: Has patient fallen in last 6 months? Yes. Number of falls 2  LIVING ENVIRONMENT: Lives with: lives with their spouse and splits time between [place] and [place]  Lives in: House/apartment Stairs: 4 steps to enter and one level in [place]; flight of steps to bedroom in [place] Has following equipment at home: bilateral walking poles  PLOF: Independent  PATIENT GOALS: To try to prevent future falls  OBJECTIVE:     TODAY'S TREATMENT: 11/02/23 Activity Comments                           Note: Objective measures were completed at Evaluation unless otherwise noted.  DIAGNOSTIC FINDINGS: NA for this episode  COGNITION: Overall cognitive status: Within functional limits for tasks assessed   SENSATION: Light touch: WFL per patient report  POSTURE: rounded shoulders and forward head  LOWER EXTREMITY ROM:     Active  Right Eval Left Eval  Hip flexion    Hip extension    Hip abduction    Hip adduction    Hip internal rotation    Hip external rotation    Knee flexion    Knee extension  -25  Ankle dorsiflexion 15 5  Ankle plantarflexion    Ankle inversion    Ankle eversion     (Blank rows = not tested)  LOWER EXTREMITY MMT:    MMT Right Eval Left Eval  Hip flexion 5 4+  Hip extension    Hip abduction    Hip adduction    Hip internal rotation    Hip external rotation    Knee flexion 5 4  Knee extension 5 4  Ankle dorsiflexion 3+ 3+  Ankle plantarflexion    Ankle inversion    Ankle eversion    (Blank rows = not tested)  TRANSFERS: Sit to stand: SBA  Assistive device utilized: None     Stand to sit: SBA  Assistive device utilized: slowed pace, prefers to use UE support and None      GAIT: Findings: Gait Characteristics: step through pattern, decreased step length- Left, decreased ankle  dorsiflexion- Left, and poor foot clearance- Left, Distance walked: 50 ft, Assistive device utilized:None, Level of assistance: SBA, and Comments: reports decreased knowledge of where L foot is with gait  FUNCTIONAL TESTS:  5 times sit to stand: 25.97 sec hands at knees Timed up and go (TUG): 13.72 10 meter walk test: 13.5 sec = 2.43 ft/sec   M-CTSIB  Condition 1: Firm Surface, EO 30 Sec, Normal Sway  Condition 2: Firm Surface, EC 30 Sec, Moderate Sway  Condition 3: Foam Surface, EO 30 Sec, Moderate Sway  Condition 4: Foam Surface, EC 28.25 Sec, Severe Sway  TREATMENT DATE: 10/27/2023    PATIENT EDUCATION: Education details: Eval results, POC Person educated: Patient Education method: Explanation Education comprehension: verbalized understanding  HOME EXERCISE PROGRAM: Access Code: [email or web address] URL: [email or web address] Date: 10/27/2023 Prepared by: [facility] - Outpatient  Rehab - [facility]  Exercises - Heel Toe Raises with Counter Support  - 1-2 x daily - 7 x weekly - 2 sets - 10 reps - Romberg Stance with Head Rotation  - 1-2 x daily - 7 x weekly - 1 sets - 5-10 reps - Romberg Stance with Head Nods  - 1-2 x daily - 7 x weekly - 1 sets - 5-10 reps  GOALS: Goals reviewed with patient? Yes  SHORT TERM GOALS: Target date: 11/25/2023  Pt will be independent with HEP for improved strength, balance, gait. Baseline: Goal status: IN PROGRESS  2.  Pt will improve 5x sit<>stand to less than or equal to 18 sec to demonstrate improved functional strength and transfer efficiency. Baseline: 25.97 sec Goal status: IIN PROGRESS  3.  Pt will improve Condition 4 on MCTSIB to 30 sec mod sway or less for improved balance. Baseline: 28 sec severe sway Goal status: IN PROGRESS  LONG TERM GOALS: Target date: 12/10/2023  Pt will be independent with  HEP for improved strength, balance, gait. Baseline:  Goal status: IN PROGRESS  2.  Pt will improve 5x sit<>stand to less than or equal to 15 sec to demonstrate improved functional strength and transfer efficiency. Baseline: 25.97 sec Goal status: IN PROGRESS  3.  Pt will improve gait velocity to at least 2.62 ft/sec for improved gait efficiency and safety. Baseline: 2.4 ft/sec Goal status: IN PROGRESS  4.  Pt will improve DGI score to at least 19/24 to decrease fall risk. Baseline: TBA Goal status: IN PROGRESS   ASSESSMENT:  CLINICAL IMPRESSION: Patient is a 78 y.o. female who was seen today for physical therapy evaluation and treatment for imbalance, falls. She reports at least 2 falls in the past 6-7 months, with decreased balance confidence, decreased activity level and slowing down from participation in community exercise classes.  She has history of lumbar fusion and neck surgery (2021 and prior) and reports LLE neuropathy.  She presents today with decreased lower extremity strength, (LLE is weaker), decreased flexibility, decreased balance, decreased timing and coordination of gait.  She is at increased fall risk per FTSTS, TUG scores and is limited community ambulator per gait velocity score.  She demo decreased vestibular system use for balance on MCTSIB test.  She will benefit from skilled PT to address the above stated deficits to decrease fall risk and improve overall functional mobility.   OBJECTIVE IMPAIRMENTS: Abnormal gait, decreased balance, decreased knowledge of use of DME, decreased mobility, difficulty walking, decreased ROM, decreased strength, and impaired flexibility.   ACTIVITY LIMITATIONS: standing, transfers, and locomotion level  PARTICIPATION LIMITATIONS: shopping, community activity, and community fitness and travel  PERSONAL FACTORS: 3+ comorbidities: see PMH are also affecting patient's functional outcome.   REHAB POTENTIAL: Good  CLINICAL DECISION  MAKING: Evolving/moderate complexity  EVALUATION COMPLEXITY: Moderate  PLAN:  PT FREQUENCY: 2x/week  PT DURATION: 6 weeks plus eval visit  PLANNED INTERVENTIONS: 97750- Physical Performance Testing, 97110-Therapeutic exercises, 97530- Therapeutic activity, 97112- Neuromuscular re-education, [phone]- Self Care, 46962- Gait training, Patient/Family education, and Balance training  PLAN FOR NEXT SESSION: Assess DGI, review initial HEP and progress HEP for LLE strength and for balance, multi-sensory balance   Arlene Lacy, PT 11/01/2023, 10:09 AM  [place] [facility] at [facility] [address] [place], [place] [address] Phone # [phone] Fax # [phone]

## 2023-11-02 ENCOUNTER — Ambulatory Visit: Admitting: Physical Therapy

## 2023-11-02 ENCOUNTER — Encounter: Payer: Self-pay | Admitting: Physical Therapy

## 2023-11-02 DIAGNOSIS — R2689 Other abnormalities of gait and mobility: Secondary | ICD-10-CM

## 2023-11-02 DIAGNOSIS — R2681 Unsteadiness on feet: Secondary | ICD-10-CM

## 2023-11-02 DIAGNOSIS — M6281 Muscle weakness (generalized): Secondary | ICD-10-CM

## 2023-11-03 NOTE — Therapy (Signed)
 OUTPATIENT PHYSICAL THERAPY NEURO TREATMENT   Patient Name: Jenny [name] MRN: [id number] DOB:[birth year], 78 y.o., female Today's Date: 11/04/2023   PCP: Cleave Curling, MD  REFERRING PROVIDER: Cleave Curling, MD   END OF SESSION:  PT End of Session - 11/04/23 1657     Visit Number 3    Number of Visits 13    Date for PT Re-Evaluation 12/10/23    Authorization Type Medicare/BCBS    Progress Note Due on Visit 10    PT Start Time 1618    PT Stop Time 1658    PT Time Calculation (min) 40 min    Activity Tolerance Patient tolerated treatment well    Behavior During Therapy WFL for tasks assessed/performed            Past Medical History:  Diagnosis Date   Allergy    Anemia    Anxiety    Arthritis    generalized   Bursitis of both hips    Cancer (HCC) 2010   Melanoma Left Arm   Cataract 2016,2017   Chronic kidney disease    Depression    Hyperlipidemia    Hypertension    Neuromuscular disorder (HCC)    Peripheral Nueropathy ( Left Side)   Plantar fasciitis    PONV (postoperative nausea and vomiting)    2014 (per pt, hard time waking up)   Preop cardiovascular exam 12/17/2016   Sleep apnea    Past Surgical History:  Procedure Laterality Date   BACK SURGERY  05/04/2013   Cervical Discectomy with Fusion by Dr.McGovern   BREAST SURGERY  1980   fibroadenoma   CATARACT EXTRACTION Left 06/14/2015   CATARACT EXTRACTION Right 05/08/2015   CERVICAL SPINE SURGERY  2014   spinal fusion   EYE SURGERY Bilateral    Yag procedure both eyes   MELANOMA EXCISION Left 2010   left arm   TONSILLECTOMY     removed at age 33   TRANSFORAMINAL LUMBAR INTERBODY FUSION (TLIF) WITH PEDICLE SCREW FIXATION 1 LEVEL N/A 04/11/2020   Procedure: TRANSFORAMINAL LUMBAR INTERBODY FUSION (TLIF) L5-S1, L4-5 LEFT LAMINOTOMY/ DECOMPRESSION;  Surgeon: Mort Ards, MD;  Location: [facility] OR;  Service: Orthopedics;  Laterality: N/A;  5 hrs   TUBAL LIGATION     Patient Active Problem List    Diagnosis Date Noted   History of foot fracture 10/04/2023   Recurrent falls 10/04/2023   Imbalance 10/04/2023   Memory changes 10/04/2023   Grief 04/06/2023   Adjustment insomnia 04/06/2023   Bilateral hand pain 04/06/2023   S/P lumbar fusion 04/11/2020   Pre-op evaluation 03/07/2020   Abnormal EKG 03/07/2020   Blood glucose abnormal 03/21/2019   Cardiovascular stress test abnormal 03/21/2019   Cervical disc disorder 03/21/2019   Disc disorder of lumbar region 03/21/2019   Depressive disorder 03/21/2019   Disorder of bone and articular cartilage 03/21/2019   Hyperlipidemia 03/21/2019   Impaired fasting glucose 03/21/2019   Mammogram abnormal 03/21/2019   Numbness of lower limb 03/21/2019   Osteopenia 03/21/2019   Seborrheic keratosis 03/21/2019   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   Varicose veins of lower extremity 03/21/2019   Vitamin D  deficiency 03/21/2019   Spinal stenosis of lumbar region 01/20/2019   Bursitis of left hip 01/03/2019   Peripheral neuropathic pain 01/03/2019   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   Need for immunization against influenza 03/16/2018   Proteinuria 09/29/2017   Chronic kidney disease, 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   Overweight (BMI 25.0-29.9) 04/12/2017   Preop  cardiovascular exam 12/17/2016   Prediabetes 12/17/2016   Obstructive sleep apnea syndrome 04/08/2015    ONSET DATE: 10/04/2023 (MD referral)  REFERRING DIAG:  R29.6 (ICD-10-CM) - Recurrent falls  R26.89 (ICD-10-CM) - Imbalance    THERAPY DIAG:  Unsteadiness on feet  Other abnormalities of gait and mobility  Muscle weakness (generalized)  Rationale for Evaluation and Treatment: Rehabilitation  SUBJECTIVE:                                                                                                                                                                                             SUBJECTIVE STATEMENT: Did exercises in my sandals and  was able to lift her heels higher. Reports feeling better about being able to turn her head without worrying.    Pt accompanied by: self  PERTINENT HISTORY: plantar fascitis, back pain, hx of back surgery, neck surgery, neuropathy on LLE, falls with 5th metatarsal fracture, vertigo  PAIN:  Are you having pain? Yes: NPRS scale: not sore enough for me to take another tylenol /10 Pain location: L neck Pain description: achy Aggravating factors: head rotation Relieving factors: time  PRECAUTIONS: Fall  RED FLAGS: None   WEIGHT BEARING RESTRICTIONS: No  FALLS: Has patient fallen in last 6 months? Yes. Number of falls 2  LIVING ENVIRONMENT: Lives with: lives with their spouse and splits time between [place] and [place]  Lives in: House/apartment Stairs: 4 steps to enter and one level in [place]; flight of steps to bedroom in [place] Has following equipment at home: bilateral walking poles  PLOF: Independent  PATIENT GOALS: To try to prevent future falls  OBJECTIVE:     TODAY'S TREATMENT: 11/04/23 Activity Comments  fwd/back stepping Mild-moderate Instability without UE support; cues for wider BOS  fwd/back stepping + head nods Required 1 UE support; cues to coordinate steps   walking on heel/toes  Occasional UE support; more difficulty with walking on heels   Sidestepping, then fwd/back stepping onto/off foam  Encouraged looking down to ensure safe foot placement, 1 fingertip support for backwards steps     HOME EXERCISE PROGRAM Last updated: 11/04/23 Access Code: [email or web address] URL: [email or web address] Date: 11/04/2023 Prepared by: [facility] - Outpatient  Rehab - [facility]  Exercises - Heel Toe Raises with Counter Support  - 1-2 x daily - 7 x weekly - 2 sets - 10 reps - Romberg Stance with Head Rotation  - 1-2 x daily - 7 x weekly - 1 sets - 5-10 reps - Romberg Stance with Head Nods  - 1-2 x daily - 7 x weekly - 1 sets - 5-10 reps - Alternating Step Forward with  Support  - 1 x daily - 5 x weekly - 2 sets - 10 reps - Standing Gastroc Stretch at Counter  - 1 x daily - 5 x weekly - 2 sets - 30 sec hold    PATIENT EDUCATION: Education details: HEP update with cues for safety; answered pt's questions on cognitive changes; edu on habit pairing  Person educated: Patient Education method: Explanation, Demonstration, Tactile cues, Verbal cues, and Handouts Education comprehension: verbalized understanding and returned demonstration    Note: Objective measures were completed at Evaluation unless otherwise noted.  DIAGNOSTIC FINDINGS: NA for this episode  COGNITION: Overall cognitive status: Within functional limits for tasks assessed   SENSATION: Light touch: WFL per patient report  POSTURE: rounded shoulders and forward head  LOWER EXTREMITY ROM:     Active  Right Eval Left Eval  Hip flexion    Hip extension    Hip abduction    Hip adduction    Hip internal rotation    Hip external rotation    Knee flexion    Knee extension  -25  Ankle dorsiflexion 15 5  Ankle plantarflexion    Ankle inversion    Ankle eversion     (Blank rows = not tested)  LOWER EXTREMITY MMT:    MMT Right Eval Left Eval  Hip flexion 5 4+  Hip extension    Hip abduction    Hip adduction    Hip internal rotation    Hip external rotation    Knee flexion 5 4  Knee extension 5 4  Ankle dorsiflexion 3+ 3+  Ankle plantarflexion    Ankle inversion    Ankle eversion    (Blank rows = not tested)  TRANSFERS: Sit to stand: SBA  Assistive device utilized: None     Stand to sit: SBA  Assistive device utilized: slowed pace, prefers to use UE support and None      GAIT: Findings: Gait Characteristics: step through pattern, decreased step length- Left, decreased ankle dorsiflexion- Left, and poor foot clearance- Left, Distance walked: 50 ft, Assistive device utilized:None, Level of assistance: SBA, and Comments: reports decreased knowledge of where L foot is with  gait  FUNCTIONAL TESTS:  5 times sit to stand: 25.97 sec hands at knees Timed up and go (TUG): 13.72 10 meter walk test: 13.5 sec = 2.43 ft/sec   M-CTSIB  Condition 1: Firm Surface, EO 30 Sec, Normal Sway  Condition 2: Firm Surface, EC 30 Sec, Moderate Sway  Condition 3: Foam Surface, EO 30 Sec, Moderate Sway  Condition 4: Foam Surface, EC 28.25 Sec, Severe Sway                                                                                                                                TREATMENT DATE: 10/27/2023    PATIENT EDUCATION: Education details: Eval results, POC Person educated: Patient Education method: Explanation Education comprehension: verbalized understanding  HOME EXERCISE PROGRAM: Access  Code: [email or web address] URL: [email or web address] Date: 10/27/2023 Prepared by: [facility] - Outpatient  Rehab - [facility]  Exercises - Heel Toe Raises with Counter Support  - 1-2 x daily - 7 x weekly - 2 sets - 10 reps - Romberg Stance with Head Rotation  - 1-2 x daily - 7 x weekly - 1 sets - 5-10 reps - Romberg Stance with Head Nods  - 1-2 x daily - 7 x weekly - 1 sets - 5-10 reps  GOALS: Goals reviewed with patient? Yes  SHORT TERM GOALS: Target date: 11/25/2023  Pt will be independent with HEP for improved strength, balance, gait. Baseline: Goal status: IN PROGRESS  2.  Pt will improve 5x sit<>stand to less than or equal to 18 sec to demonstrate improved functional strength and transfer efficiency. Baseline: 25.97 sec Goal status: IIN PROGRESS  3.  Pt will improve Condition 4 on MCTSIB to 30 sec mod sway or less for improved balance. Baseline: 28 sec severe sway Goal status: IN PROGRESS  LONG TERM GOALS: Target date: 12/10/2023  Pt will be independent with HEP for improved strength, balance, gait. Baseline:  Goal status: IN PROGRESS  2.  Pt will improve 5x sit<>stand to less than or equal to 15 sec to demonstrate improved functional strength and  transfer efficiency. Baseline: 25.97 sec Goal status: IN PROGRESS  3.  Pt will improve gait velocity to at least 2.62 ft/sec for improved gait efficiency and safety. Baseline: 2.4 ft/sec Goal status: IN PROGRESS  4.  Pt will improve DGI score to at least 19/24 to decrease fall risk. Baseline: 13 Goal status: IN PROGRESS   ASSESSMENT:  CLINICAL IMPRESSION: Patient arrived to session with report of improvement in performance of her HEP. Balance challenges incorporated stepping strategy, narrow BOS, and compliant surface training. Cueing provided to improve performance. Patient reported understanding of HEP update with edu for safety. no complaints at end of session.  OBJECTIVE IMPAIRMENTS: Abnormal gait, decreased balance, decreased knowledge of use of DME, decreased mobility, difficulty walking, decreased ROM, decreased strength, and impaired flexibility.   ACTIVITY LIMITATIONS: standing, transfers, and locomotion level  PARTICIPATION LIMITATIONS: shopping, community activity, and community fitness and travel  PERSONAL FACTORS: 3+ comorbidities: see PMH are also affecting patient's functional outcome.   REHAB POTENTIAL: Good  CLINICAL DECISION MAKING: Evolving/moderate complexity  EVALUATION COMPLEXITY: Moderate  PLAN:  PT FREQUENCY: 2x/week  PT DURATION: 6 weeks plus eval visit  PLANNED INTERVENTIONS: 97750- Physical Performance Testing, 97110-Therapeutic exercises, 97530- Therapeutic activity, 97112- Neuromuscular re-education, [phone]- Self Care, 11914- Gait training, Patient/Family education, and Balance training  PLAN FOR NEXT SESSION: progress HEP for LLE strength and for balance, multi-sensory balance   Thaddeus Filippo, PT, DPT 11/04/23 5:00 PM  [facility] Re[facility] at [facility] [address] [place], [place] [address] Phone # [phone] Fax # [phone]

## 2023-11-04 ENCOUNTER — Encounter: Payer: Self-pay | Admitting: Physical Therapy

## 2023-11-04 ENCOUNTER — Ambulatory Visit: Admitting: Physical Therapy

## 2023-11-04 DIAGNOSIS — M6281 Muscle weakness (generalized): Secondary | ICD-10-CM

## 2023-11-04 DIAGNOSIS — R2681 Unsteadiness on feet: Secondary | ICD-10-CM

## 2023-11-04 DIAGNOSIS — R2689 Other abnormalities of gait and mobility: Secondary | ICD-10-CM

## 2023-11-05 ENCOUNTER — Encounter: Payer: Self-pay | Admitting: Rheumatology

## 2023-11-05 ENCOUNTER — Ambulatory Visit: Payer: Medicare Other | Attending: Rheumatology | Admitting: Rheumatology

## 2023-11-05 VITALS — BP 104/70 | HR 73 | Resp 17 | Ht 64.5 in | Wt 166.0 lb

## 2023-11-05 DIAGNOSIS — Z87891 Personal history of nicotine dependence: Secondary | ICD-10-CM | POA: Diagnosis present

## 2023-11-05 DIAGNOSIS — M2241 Chondromalacia patellae, right knee: Secondary | ICD-10-CM | POA: Diagnosis present

## 2023-11-05 DIAGNOSIS — R768 Other specified abnormal immunological findings in serum: Secondary | ICD-10-CM | POA: Diagnosis present

## 2023-11-05 DIAGNOSIS — M8589 Other specified disorders of bone density and structure, multiple sites: Secondary | ICD-10-CM | POA: Insufficient documentation

## 2023-11-05 DIAGNOSIS — M7062 Trochanteric bursitis, left hip: Secondary | ICD-10-CM | POA: Insufficient documentation

## 2023-11-05 DIAGNOSIS — M19041 Primary osteoarthritis, right hand: Secondary | ICD-10-CM | POA: Diagnosis present

## 2023-11-05 DIAGNOSIS — M5030 Other cervical disc degeneration, unspecified cervical region: Secondary | ICD-10-CM | POA: Diagnosis present

## 2023-11-05 DIAGNOSIS — G4733 Obstructive sleep apnea (adult) (pediatric): Secondary | ICD-10-CM | POA: Insufficient documentation

## 2023-11-05 DIAGNOSIS — R2689 Other abnormalities of gait and mobility: Secondary | ICD-10-CM | POA: Diagnosis present

## 2023-11-05 DIAGNOSIS — M519 Unspecified thoracic, thoracolumbar and lumbosacral intervertebral disc disorder: Secondary | ICD-10-CM | POA: Insufficient documentation

## 2023-11-05 DIAGNOSIS — F32A Depression, unspecified: Secondary | ICD-10-CM | POA: Insufficient documentation

## 2023-11-05 DIAGNOSIS — M19042 Primary osteoarthritis, left hand: Secondary | ICD-10-CM | POA: Insufficient documentation

## 2023-11-05 DIAGNOSIS — Z8269 Family history of other diseases of the musculoskeletal system and connective tissue: Secondary | ICD-10-CM | POA: Insufficient documentation

## 2023-11-05 DIAGNOSIS — L821 Other seborrheic keratosis: Secondary | ICD-10-CM | POA: Diagnosis present

## 2023-11-05 DIAGNOSIS — M792 Neuralgia and neuritis, unspecified: Secondary | ICD-10-CM | POA: Diagnosis present

## 2023-11-05 DIAGNOSIS — M2242 Chondromalacia patellae, left knee: Secondary | ICD-10-CM | POA: Diagnosis present

## 2023-11-05 DIAGNOSIS — N182 Chronic kidney disease, stage 2 (mild): Secondary | ICD-10-CM | POA: Insufficient documentation

## 2023-11-05 DIAGNOSIS — M7061 Trochanteric bursitis, right hip: Secondary | ICD-10-CM | POA: Insufficient documentation

## 2023-11-05 DIAGNOSIS — R7303 Prediabetes: Secondary | ICD-10-CM | POA: Diagnosis present

## 2023-11-05 DIAGNOSIS — E7800 Pure hypercholesterolemia, unspecified: Secondary | ICD-10-CM | POA: Insufficient documentation

## 2023-11-05 DIAGNOSIS — E559 Vitamin D deficiency, unspecified: Secondary | ICD-10-CM | POA: Insufficient documentation

## 2023-11-05 DIAGNOSIS — M160 Bilateral primary osteoarthritis of hip: Secondary | ICD-10-CM | POA: Diagnosis present

## 2023-11-05 NOTE — Patient Instructions (Addendum)
 Osteoarthritis  Osteoarthritis is a type of arthritis. It refers to joint pain or joint disease. Osteoarthritis affects tissue that covers the ends of bones in joints (cartilage). Cartilage acts as a cushion between the bones and helps them move smoothly. Osteoarthritis occurs when cartilage in the joints gets worn down. Osteoarthritis is sometimes called wear and tear arthritis. Osteoarthritis is the most common form of arthritis. It often occurs in older people. It is a condition that gets worse over time. The joints most often affected by this condition are in the fingers, toes, hips, knees, and spine, including the neck and lower back. What are the causes? This condition is caused by the wearing down of cartilage that covers the ends of bones. What increases the risk? The following factors may make you more likely to develop this condition: Being age 78 or older. Obesity. Overuse of joints. Past injury of a joint. Past surgery on a joint. Family history of osteoarthritis. What are the signs or symptoms? The main symptoms of this condition are pain, swelling, and stiffness in the joint. Other symptoms may include: An enlarged joint. More pain and further damage caused by small pieces of bone or cartilage that break off and float inside of the joint. Small deposits of bone (osteophytes) that grow on the edges of the joint. A grating or scraping feeling inside the joint when you move it. Popping or creaking sounds when you move. Difficulty walking or exercising. An inability to grip items, twist your hand, or control the movements of your hands and fingers. How is this diagnosed? This condition may be diagnosed based on: Your medical history. A physical exam. Your symptoms. X-rays of the affected joints. Blood tests to rule out other types of arthritis. How is this treated? There is no cure for this condition, but treatment can help control pain and improve joint function. Treatment  may include a combination of therapies, such as: Pain relief techniques, such as: Applying heat and cold to the joint. Massage. A form of talk therapy called cognitive behavioral therapy (CBT). This therapy helps you set goals and follow up on the changes that you make. Medicines for pain and inflammation. The medicines can be taken by mouth or applied to the skin. They include: NSAIDs, such as ibuprofen. Prescription medicines. Strong anti-inflammatory medicines (corticosteroids). Certain nutritional supplements. A prescribed exercise program. You may work with a physical therapist. Assistive devices, such as a brace, wrap, splint, specialized glove, or cane. A weight control plan. Surgery, such as: An osteotomy. This is done to reposition the bones and relieve pain or to remove loose pieces of bone and cartilage. Joint replacement surgery. You may need this surgery if you have advanced osteoarthritis. Follow these instructions at home: Activity Rest your affected joints as told by your health care provider. Exercise as told by your provider. The provider may recommend specific types of exercise, such as: Strengthening exercises. These are done to strengthen the muscles that support joints affected by arthritis. Aerobic activities. These are exercises, such as brisk walking or water aerobics, that increase your heart rate. Range-of-motion activities. These help your joints move more easily. Balance and agility exercises. Managing pain, stiffness, and swelling     If told, apply heat to the affected area as often as told by your provider. Use the heat source that your provider recommends, such as a moist heat pack or a heating pad. If you have a removable assistive device, remove it as told by your provider. Place a  towel between your skin and the heat source. If your provider tells you to keep the assistive device on while you apply heat, place a towel between the assistive device and  the heat source. Leave the heat on for 20-30 minutes. If told, put ice on the affected area. If you have a removable assistive device, remove it as told by your provider. Put ice in a plastic bag. Place a towel between your skin and the bag. If your provider tells you to keep the assistive device on during icing, place a towel between the assistive device and the bag. Leave the ice on for 20 minutes, 2-3 times a day. If your skin turns bright red, remove the ice or heat right away to prevent skin damage. The risk of damage is higher if you cannot feel pain, heat, or cold. Move your fingers or toes often to reduce stiffness and swelling. Raise (elevate) the affected area above the level of your heart while you are sitting or lying down. General instructions Take over-the-counter and prescription medicines only as told by your provider. Maintain a healthy weight. Follow instructions from your provider for weight control. Do not use any products that contain nicotine or tobacco. These products include cigarettes, chewing tobacco, and vaping devices, such as e-cigarettes. If you need help quitting, ask your provider. Use assistive devices as told by your provider. Where to find more information [employer]thritis and Musculoskeletal and Skin Diseases: niams.http://www.myers.net/ [employer] on Aging: BaseRingTones.pl [employer] of Rheumatology: rheumatology.org Contact a health care provider if: You have redness, swelling, or a feeling of warmth in a joint that gets worse. You have a fever along with joint or muscle aches. You develop a rash. You have trouble doing your normal activities. You have pain that gets worse and is not relieved by pain medicine. This information is not intended to replace advice given to you by your health care provider. Make sure you discuss any questions you have with your health care provider. Document Revised: 01/08/2022 Document Reviewed:  01/08/2022 [employer] Patient Education  2024 [employer]  Hand Exercises Hand exercises can be helpful for almost anyone. They can strengthen your hands and improve flexibility and movement. The exercises can also increase blood flow to the hands. These results can make your work and daily tasks easier for you. Hand exercises can be especially helpful for people who have joint pain from arthritis or nerve damage from using their hands over and over. These exercises can also help people who injure a hand. Exercises Most of these hand exercises are gentle stretching and motion exercises. It is usually safe to do them often throughout the day. Warming up your hands before exercise may help reduce stiffness. You can do this with gentle massage or by placing your hands in warm water for 10-15 minutes. It is normal to feel some stretching, pulling, tightness, or mild discomfort when you begin new exercises. In time, this will improve. Remember to always be careful and stop right away if you feel sudden, very bad pain or your pain gets worse. You want to get better and be safe. Ask your health care provider which exercises are safe for you. Do exercises exactly as told by your provider and adjust them as told. Do not begin these exercises until told by your provider. Knuckle bend or claw fist  Stand or sit with your arm, hand, and all five fingers pointed straight up. Make sure to keep your wrist straight. Gently  bend your fingers down toward your palm until the tips of your fingers are touching your palm. Keep your big knuckle straight and only bend the small knuckles in your fingers. Hold this position for 10 seconds. Straighten your fingers back to your starting position. Repeat this exercise 5-10 times with each hand. Full finger fist  Stand or sit with your arm, hand, and all five fingers pointed straight up. Make sure to keep your wrist straight. Gently bend your fingers into your palm until  the tips of your fingers are touching the middle of your palm. Hold this position for 10 seconds. Extend your fingers back to your starting position, stretching every joint fully. Repeat this exercise 5-10 times with each hand. Straight fist  Stand or sit with your arm, hand, and all five fingers pointed straight up. Make sure to keep your wrist straight. Gently bend your fingers at the big knuckle, where your fingers meet your hand, and at the middle knuckle. Keep the knuckle at the tips of your fingers straight and try to touch the bottom of your palm. Hold this position for 10 seconds. Extend your fingers back to your starting position, stretching every joint fully. Repeat this exercise 5-10 times with each hand. Tabletop  Stand or sit with your arm, hand, and all five fingers pointed straight up. Make sure to keep your wrist straight. Gently bend your fingers at the big knuckle, where your fingers meet your hand, as far down as you can. Keep the small knuckles in your fingers straight. Think of forming a tabletop with your fingers. Hold this position for 10 seconds. Extend your fingers back to your starting position, stretching every joint fully. Repeat this exercise 5-10 times with each hand. Finger spread  Place your hand flat on a table with your palm facing down. Make sure your wrist stays straight. Spread your fingers and thumb apart from each other as far as you can until you feel a gentle stretch. Hold this position for 10 seconds. Bring your fingers and thumb tight together again. Hold this position for 10 seconds. Repeat this exercise 5-10 times with each hand. Making circles  Stand or sit with your arm, hand, and all five fingers pointed straight up. Make sure to keep your wrist straight. Make a circle by touching the tip of your thumb to the tip of your index finger. Hold for 10 seconds. Then open your hand wide. Repeat this motion with your thumb and each of your  fingers. Repeat this exercise 5-10 times with each hand. Thumb motion  Sit with your forearm resting on a table and your wrist straight. Your thumb should be facing up toward the ceiling. Keep your fingers relaxed as you move your thumb. Lift your thumb up as high as you can toward the ceiling. Hold for 10 seconds. Bend your thumb across your palm as far as you can, reaching the tip of your thumb for the small finger (pinkie) side of your palm. Hold for 10 seconds. Repeat this exercise 5-10 times with each hand. Grip strengthening  Hold a stress ball or other soft ball in the middle of your hand. Slowly increase the pressure, squeezing the ball as much as you can without causing pain. Think of bringing the tips of your fingers into the middle of your palm. All of your finger joints should bend when doing this exercise. Hold your squeeze for 10 seconds, then relax. Repeat this exercise 5-10 times with each hand. Contact a health  care provider if: Your hand pain or discomfort gets much worse when you do an exercise. Your hand pain or discomfort does not improve within 2 hours after you exercise. If you have either of these problems, stop doing these exercises right away. Do not do them again unless your provider says that you can. Get help right away if: You develop sudden, severe hand pain or swelling. If this happens, stop doing these exercises right away. Do not do them again unless your provider says that you can. This information is not intended to replace advice given to you by your health care provider. Make sure you discuss any questions you have with your health care provider. Document Revised: 05/26/2022 Document Reviewed: 05/26/2022 Else[employer] Patient Education  2024 [employer] Exercises for Chronic Knee Pain Chronic knee pain is pain that lasts longer than 3 months. For most people with chronic knee pain, exercise and weight loss is an important part of treatment. Your health care  provider may want you to focus on: Making the muscles that support your knee stronger. This can take pressure off your knee and reduce pain. Preventing knee stiffness. How far you can move your knee, keeping it there or making it farther. Losing weight (if this applies) to take pressure off your knee, lower your risk for injury, and make it easier for you to exercise. Your provider will help you make an exercise program that fits your needs and physical abilities. Below are simple, low-impact exercises you can do at home. Ask your provider or physical therapist how often you should do your exercise program and how many times to repeat each exercise. General safety tips  Get your provider's approval before doing any exercises. Start slowly and stop any time you feel pain. Do not exercise if your knee pain is flaring up. Warm up first. Stretching a cold muscle can cause an injury. Do 5-10 minutes of easy movement or light stretching before beginning your exercises. Do 5-10 minutes of low-impact activity (like walking or cycling) before starting strengthening exercises. Contact your provider any time you have pain during or after exercising. Exercise can cause discomfort but should not be painful. It is normal to be a little stiff or sore after exercising. Stretching and range-of-motion exercises Front thigh stretch  Stand up straight and support your body by holding on to a chair or resting one hand on a wall. With your legs straight and close together, bend one knee to lift your heel up toward your butt. Using one hand for support, grab your ankle with your free hand. Pull your foot up closer toward your butt to feel the stretch in front of your thigh. Hold the stretch for 30 seconds. Repeat __________ times. Complete this exercise __________ times a day. Back thigh stretch  Sit on the floor with your back straight and your legs out straight in front of you. Place the palms of your hands on  the floor and slide them toward your feet as you bend at the hip. Try to touch your nose to your knees and feel the stretch in the back of your thighs. Hold for 30 seconds. Repeat __________ times. Complete this exercise __________ times a day. Calf stretch  Stand facing a wall. Place the palms of your hands flat against the wall, arms extended, and lean slightly against the wall. Get into a lunge position with one leg bent at the knee and the other leg stretched out straight behind you. Keep both feet facing  the wall and increase the bend in your knee while keeping the heel of the other leg flat on the ground. You should feel the stretch in your calf. Hold for 30 seconds. Repeat __________ times. Complete this exercise __________ times a day. Strengthening exercises Straight leg lift  Lie on your back with one knee bent and the other leg out straight. Slowly lift the straight leg without bending the knee. Lift until your foot is about 12 inches (30 cm) off the floor. Hold for 3-5 seconds and slowly lower your leg. Repeat __________ times. Complete this exercise __________ times a day. Single leg dip  Stand between two chairs and put both hands on the backs of the chairs for support. Extend one leg out straight with your body weight resting on the heel of the standing leg. Slowly bend your standing knee to dip your body to the level that is comfortable for you. Hold for 3-5 seconds. Repeat __________ times. Complete this exercise __________ times a day. Hamstring curls  Stand straight, knees close together, facing the back of a chair. Hold on to the back of a chair with both hands. Keep one leg straight. Bend the other knee while bringing the heel up toward the butt until the knee is bent at a 90-degree angle (right angle). Hold for 3-5 seconds. Repeat __________ times. Complete this exercise __________ times a day. Wall squat  Stand straight with your back, hips, and head against  a wall. Step forward one foot at a time with your back still against the wall. Your feet should be 2 feet (61 cm) from the wall at shoulder width. Keeping your back, hips, and head against the wall, slide down the wall to as close to a sitting position as you can get. Hold for 5-10 seconds, then slowly slide back up. Repeat __________ times. Complete this exercise __________ times a day. Step-ups  Stand in front of a sturdy platform or stool that is about 6 inches (15 cm) high. Slowly step up with your left / right foot, keeping your knee in line with your hip and foot. Do not let your knee bend so far that you cannot see your toes. Hold on to a chair for balance, but do not use it for support. Slowly unlock your knee and lower yourself to the starting position. Repeat __________ times. Complete this exercise __________ times a day. Contact a health care provider if: Your exercises cause pain. Your pain is worse after you exercise. Your pain prevents you from doing your exercises. This information is not intended to replace advice given to you by your health care provider. Make sure you discuss any questions you have with your health care provider. Document Revised: 05/26/2022 Document Reviewed: 05/26/2022 Elsevier Patient Education  2024 [employer] Low Back Sprain or Strain Rehab Ask your health care provider which exercises are safe for you. Do exercises exactly as told by your health care provider and adjust them as directed. It is normal to feel mild stretching, pulling, tightness, or discomfort as you do these exercises. Stop right away if you feel sudden pain or your pain gets worse. Do not begin these exercises until told by your health care provider. Stretching and range-of-motion exercises These exercises warm up your muscles and joints and improve the movement and flexibility of your back. These exercises also help to relieve pain, numbness, and tingling. Lumbar rotation  Lie on  your back on a firm bed or the floor with your knees  bent. Straighten your arms out to your sides so each arm forms a 90-degree angle (right angle) with a side of your body. Slowly move (rotate) both of your knees to one side of your body until you feel a stretch in your lower back (lumbar). Try not to let your shoulders lift off the floor. Hold this position for __________ seconds. Tense your abdominal muscles and slowly move your knees back to the starting position. Repeat this exercise on the other side of your body. Repeat __________ times. Complete this exercise __________ times a day. Single knee to chest  Lie on your back on a firm bed or the floor with both legs straight. Bend one of your knees. Use your hands to move your knee up toward your chest until you feel a gentle stretch in your lower back and buttock. Hold your leg in this position by holding on to the front of your knee. Keep your other leg as straight as possible. Hold this position for __________ seconds. Slowly return to the starting position. Repeat with your other leg. Repeat __________ times. Complete this exercise __________ times a day. Prone extension on elbows  Lie on your abdomen on a firm bed or the floor (prone position). Prop yourself up on your elbows. Use your arms to help lift your chest up until you feel a gentle stretch in your abdomen and your lower back. This will place some of your body weight on your elbows. If this is uncomfortable, try stacking pillows under your chest. Your hips should stay down, against the surface that you are lying on. Keep your hip and back muscles relaxed. Hold this position for __________ seconds. Slowly relax your upper body and return to the starting position. Repeat __________ times. Complete this exercise __________ times a day. Strengthening exercises These exercises build strength and endurance in your back. Endurance is the ability to use your muscles for a long  time, even after they get tired. Pelvic tilt This exercise strengthens the muscles that lie deep in the abdomen. Lie on your back on a firm bed or the floor with your legs extended. Bend your knees so they are pointing toward the ceiling and your feet are flat on the floor. Tighten your lower abdominal muscles to press your lower back against the floor. This motion will tilt your pelvis so your tailbone points up toward the ceiling instead of pointing to your feet or the floor. To help with this exercise, you may place a small towel under your lower back and try to push your back into the towel. Hold this position for __________ seconds. Let your muscles relax completely before you repeat this exercise. Repeat __________ times. Complete this exercise __________ times a day. Alternating arm and leg raises  Get on your hands and knees on a firm surface. If you are on a hard floor, you may want to use padding, such as an exercise mat, to cushion your knees. Line up your arms and legs. Your hands should be directly below your shoulders, and your knees should be directly below your hips. Lift your left leg behind you. At the same time, raise your right arm and straighten it in front of you. Do not lift your leg higher than your hip. Do not lift your arm higher than your shoulder. Keep your abdominal and back muscles tight. Keep your hips facing the ground. Do not arch your back. Keep your balance carefully, and do not hold your breath. Hold this position  for __________ seconds. Slowly return to the starting position. Repeat with your right leg and your left arm. Repeat __________ times. Complete this exercise __________ times a day. Abdominal set with straight leg raise  Lie on your back on a firm bed or the floor. Bend one of your knees and keep your other leg straight. Tense your abdominal muscles and lift your straight leg up, 4-6 inches (10-15 cm) off the ground. Keep your abdominal  muscles tight and hold this position for __________ seconds. Do not hold your breath. Do not arch your back. Keep it flat against the ground. Keep your abdominal muscles tense as you slowly lower your leg back to the starting position. Repeat with your other leg. Repeat __________ times. Complete this exercise __________ times a day. Single leg lower with bent knees Lie on your back on a firm bed or the floor. Tense your abdominal muscles and lift your feet off the floor, one foot at a time, so your knees and hips are bent in 90-degree angles (right angles). Your knees should be over your hips and your lower legs should be parallel to the floor. Keeping your abdominal muscles tense and your knee bent, slowly lower one of your legs so your toe touches the ground. Lift your leg back up to return to the starting position. Do not hold your breath. Do not let your back arch. Keep your back flat against the ground. Repeat with your other leg. Repeat __________ times. Complete this exercise __________ times a day. Posture and body mechanics Good posture and healthy body mechanics can help to relieve stress in your body's tissues and joints. Body mechanics refers to the movements and positions of your body while you do your daily activities. Posture is part of body mechanics. Good posture means: Your spine is in its natural S-curve position (neutral). Your shoulders are pulled back slightly. Your head is not tipped forward (neutral). Follow these guidelines to improve your posture and body mechanics in your everyday activities. Standing  When standing, keep your spine neutral and your feet about hip-width apart. Keep a slight bend in your knees. Your ears, shoulders, and hips should line up. When you do a task in which you stand in one place for a long time, place one foot up on a stable object that is 2-4 inches (5-10 cm) high, such as a footstool. This helps keep your spine  neutral. Sitting  When sitting, keep your spine neutral and keep your feet flat on the floor. Use a footrest, if necessary, and keep your thighs parallel to the floor. Avoid rounding your shoulders, and avoid tilting your head forward. When working at a desk or a computer, keep your desk at a height where your hands are slightly lower than your elbows. Slide your chair under your desk so you are close enough to maintain good posture. When working at a computer, place your monitor at a height where you are looking straight ahead and you do not have to tilt your head forward or downward to look at the screen. Resting When lying down and resting, avoid positions that are most painful for you. If you have pain with activities such as sitting, bending, stooping, or squatting, lie in a position in which your body does not bend very much. For example, avoid curling up on your side with your arms and knees near your chest (fetal position). If you have pain with activities such as standing for a long time or reaching with  your arms, lie with your spine in a neutral position and bend your knees slightly. Try the following positions: Lying on your side with a pillow between your knees. Lying on your back with a pillow under your knees. Lifting  When lifting objects, keep your feet at least shoulder-width apart and tighten your abdominal muscles. Bend your knees and hips and keep your spine neutral. It is important to lift using the strength of your legs, not your back. Do not lock your knees straight out. Always ask for help to lift heavy or awkward objects. This information is not intended to replace advice given to you by your health care provider. Make sure you discuss any questions you have with your health care provider. Document Revised: 09/14/2022 Document Reviewed: 07/29/2020 Elsevier Patient Education  2024 [employer]  Iliotibial Band Syndrome Rehab Ask your health care provider which  exercises are safe for you. Do exercises exactly as told by your provider and adjust them as told. It's normal to feel mild stretching, pulling, tightness, or discomfort as you do these exercises. Stop right away if you feel sudden pain or your pain gets a lot worse. Do not begin these exercises until told by your provider. Stretching and range-of-motion exercises These exercises warm up your muscles and joints. They also improve the movement and flexibility of your hip and pelvis. Quadriceps stretch, prone  Lie face down (prone) on a firm surface like a bed or padded floor. Bend your left / right knee. Reach back to hold your ankle or pant leg. If you can't reach your ankle or pant leg, use a belt looped around your foot and grab the belt instead. Gently pull your heel toward your butt. Your knee should not slide out to the side. You should feel a stretch in the front of your thigh and knee, also called the quadriceps. Hold this position for __________ seconds. Repeat __________ times. Complete this exercise __________ times a day. Iliotibial band stretch The iliotibial band is a strip of tissue that runs along the outside of your hip down to your knee. Lie on your side with your left / right leg on top. Bend both knees and grab your left / right ankle. Stretch out your bottom arm to help you balance. Slowly bring your top knee back so your thigh goes behind your back. Slowly lower your top leg toward the floor until you feel a gentle stretch on the outside of your left / right hip and thigh. If you don't feel a stretch and your knee won't go farther, place the heel of your other foot on top of your knee and pull your knee down toward the floor with your foot. Hold this position for __________ seconds. Repeat __________ times. Complete this exercise __________ times a day. Strengthening exercises These exercises build strength and endurance in your hip and pelvis. Endurance means your muscles can  keep working even when they're tired. Straight leg raises, side-lying This exercise strengthens the muscles that rotate the leg at the hip and move it away from your body. These muscles are called hip abductors. Lie on your side with your left / right leg on top. Lie so your head, shoulder, hip, and knee line up. You can bend your bottom knee to help you balance. Roll your hips slightly forward so they're stacked directly over each other. Your left / right knee should face forward. Tense the muscles in your outer thigh and hip. Lift your top leg 4-6 inches (  10-15 cm) off the ground. Hold this position for __________ seconds. Slowly lower your leg back down to the starting position. Let your muscles fully relax before doing this exercise again. Repeat __________ times. Complete this exercise __________ times a day. Leg raises, prone This exercise strengthens the muscles that move the hips backward. These muscles are called hip extensors. Lie face down (prone) on your bed or a firm surface. You can put a pillow under your hips for comfort and to support your lower back. Bend your left / right knee so your foot points straight up toward the ceiling. Keep the other leg straight and behind you. Squeeze your butt muscles. Lift your left / right thigh off the firm surface. Do not let your back arch. Tense your thigh muscle as hard as you can without having more knee pain. Hold this position for __________ seconds. Slowly lower your leg to the starting position. Allow your leg to relax all the way. Repeat __________ times. Complete this exercise __________ times a day. Hip hike  Stand sideways on a bottom step. Place your feet so that your left / right leg is on the step, and the other foot is hanging off the side. If you need support for balance, hold onto a railing or wall. Keep your knees straight and your abdomen square, meaning your hips are level. Then, lift your left / right hip up toward the  ceiling. Slowly let your leg that's hanging off the step lower towards the floor. Your foot should get closer to the ground. Do not lean or bend your knees during this movement. Repeat __________ times. Complete this exercise __________ times a day. This information is not intended to replace advice given to you by your health care provider. Make sure you discuss any questions you have with your health care provider. Document Revised: 07/24/2022 Document Reviewed: 07/24/2022 Elsevier Patient Education  2024 [employer].

## 2023-11-08 NOTE — Therapy (Signed)
 OUTPATIENT PHYSICAL THERAPY NEURO TREATMENT   Patient Name: Jenny [name] MRN: [id number] DOB:12-04-1945, 78 y.o., female Today's Date: 11/09/2023   PCP: Jarold Medici, MD  REFERRING PROVIDER: Jarold Medici, MD   END OF SESSION:  PT End of Session - 11/09/23 1616     Visit Number 4    Number of Visits 13    Date for PT Re-Evaluation 12/10/23    Authorization Type Medicare/BCBS    Progress Note Due on Visit 10    PT Start Time 1536    PT Stop Time 1616    PT Time Calculation (min) 40 min    Activity Tolerance Patient tolerated treatment well    Behavior During Therapy WFL for tasks assessed/performed             Past Medical History:  Diagnosis Date   Allergy    Anemia    Anxiety    Arthritis    generalized   Bursitis of both hips    Cancer (HCC) 2010   Melanoma Left Arm   Cataract 2016,2017   Chronic kidney disease    Depression    Hyperlipidemia    Hypertension    Neuromuscular disorder (HCC)    Peripheral Nueropathy ( Left Side)   Plantar fasciitis    PONV (postoperative nausea and vomiting)    2014 (per pt, hard time waking up)   Preop cardiovascular exam 12/17/2016   Sleep apnea    Past Surgical History:  Procedure Laterality Date   BACK SURGERY  05/04/2013   Cervical Discectomy with Fusion by Dr.McGovern   BREAST SURGERY  1980   fibroadenoma   CATARACT EXTRACTION Left 06/14/2015   CATARACT EXTRACTION Right 05/08/2015   CERVICAL SPINE SURGERY  2014   spinal fusion   EYE SURGERY Bilateral    Yag procedure both eyes   MELANOMA EXCISION Left 2010   left arm   TONSILLECTOMY     removed at age 26   TRANSFORAMINAL LUMBAR INTERBODY FUSION (TLIF) WITH PEDICLE SCREW FIXATION 1 LEVEL N/A 04/11/2020   Procedure: TRANSFORAMINAL LUMBAR INTERBODY FUSION (TLIF) L5-S1, L4-5 LEFT LAMINOTOMY/ DECOMPRESSION;  Surgeon: Burnetta Aures, MD;  Location: [facility] OR;  Service: Orthopedics;  Laterality: N/A;  5 hrs   TUBAL LIGATION     Patient Active Problem List    Diagnosis Date Noted   History of foot fracture 10/04/2023   Recurrent falls 10/04/2023   Imbalance 10/04/2023   Memory changes 10/04/2023   Grief 04/06/2023   Adjustment insomnia 04/06/2023   Bilateral hand pain 04/06/2023   S/P lumbar fusion 04/11/2020   Pre-op evaluation 03/07/2020   Abnormal EKG 03/07/2020   Blood glucose abnormal 03/21/2019   Cardiovascular stress test abnormal 03/21/2019   Cervical disc disorder 03/21/2019   Disc disorder of lumbar region 03/21/2019   Depressive disorder 03/21/2019   Disorder of bone and articular cartilage 03/21/2019   Hyperlipidemia 03/21/2019   Impaired fasting glucose 03/21/2019   Mammogram abnormal 03/21/2019   Numbness of lower limb 03/21/2019   Osteopenia 03/21/2019   Seborrheic keratosis 03/21/2019   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   Varicose veins of lower extremity 03/21/2019   Vitamin D  deficiency 03/21/2019   Spinal stenosis of lumbar region 01/20/2019   Bursitis of left hip 01/03/2019   Peripheral neuropathic pain 01/03/2019   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   Need for immunization against influenza 03/16/2018   Proteinuria 09/29/2017   Chronic kidney disease, 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   Overweight (BMI 25.0-29.9) 04/12/2017  Preop cardiovascular exam 12/17/2016   Prediabetes 12/17/2016   Obstructive sleep apnea syndrome 04/08/2015    ONSET DATE: 10/04/2023 (MD referral)  REFERRING DIAG:  R29.6 (ICD-10-CM) - Recurrent falls  R26.89 (ICD-10-CM) - Imbalance    THERAPY DIAG:  Unsteadiness on feet  Other abnormalities of gait and mobility  Muscle weakness (generalized)  Rationale for Evaluation and Treatment: Rehabilitation  SUBJECTIVE:                                                                                                                                                                                             SUBJECTIVE STATEMENT: Went ot [place]  and  walked over 9,000 steps. Was on the boat a lot and noticed some remaining sensation of rocking after getting off the boat but it was not too bad.    Pt accompanied by: self  PERTINENT HISTORY: plantar fascitis, back pain, hx of back surgery, neck surgery, neuropathy on LLE, falls with 5th metatarsal fracture, vertigo  PAIN:  Are you having pain? Yes: NPRS scale: 4-6/10 Pain location: LBP and generalized pain Pain description: achy Aggravating factors: walking Relieving factors: time  PRECAUTIONS: Fall  RED FLAGS: None   WEIGHT BEARING RESTRICTIONS: No  FALLS: Has patient fallen in last 6 months? Yes. Number of falls 2  LIVING ENVIRONMENT: Lives with: lives with their spouse and splits time between [place] and [place]  Lives in: House/apartment Stairs: 4 steps to enter and one level in [place]; flight of steps to bedroom in [place] Has following equipment at home: bilateral walking poles  PLOF: Independent  PATIENT GOALS: To try to prevent future falls  OBJECTIVE:     TODAY'S TREATMENT: 11/09/23 Activity Comments  fwd/back stepping  Improved coordination and rhythm with steps; cues for wider BOS; requires occasional UE support   fwd/back stepping + head nods Cueing for coordinating head movements; requires consistent 1 UE support   walking with EC along counter 1 UE support; cues for wider stance which improved stability   grapevine walk B UE support; pt required consistent cueing to coordinate steps in intended pattern  1/2 turns in doorway  Encouraged quick movements; good stability            HOME EXERCISE PROGRAM Last updated: 11/09/23 Access Code: [email or web address] URL: [email or web address] Date: 11/09/2023 Prepared by: [facility] - Outpatient  Rehab - [facility]  Exercises - Heel Toe Raises with Counter Support  - 1-2 x daily - 7 x weekly - 2 sets - 10 reps - Romberg Stance with Head Rotation  - 1-2 x daily - 7 x weekly - 1 sets - 5-10 reps -  Romberg Stance  with Head Nods  - 1-2 x daily - 7 x weekly - 1 sets - 5-10 reps - Alternating Step Forward with Support  - 1 x daily - 5 x weekly - 2 sets - 10 reps - Standing Gastroc Stretch at Counter  - 1 x daily - 5 x weekly - 2 sets - 30 sec hold - Carioca with Counter Support  - 1 x daily - 5 x weekly - 2 sets - 6 reps    PATIENT EDUCATION: Education details: HEP update with cues for safety, discussed pt's use of transport chair for longer distances  Person educated: Patient Education method: Explanation, Demonstration, Tactile cues, Verbal cues, and Handouts Education comprehension: verbalized understanding and returned demonstration    Note: Objective measures were completed at Evaluation unless otherwise noted.  DIAGNOSTIC FINDINGS: NA for this episode  COGNITION: Overall cognitive status: Within functional limits for tasks assessed   SENSATION: Light touch: WFL per patient report  POSTURE: rounded shoulders and forward head  LOWER EXTREMITY ROM:     Active  Right Eval Left Eval  Hip flexion    Hip extension    Hip abduction    Hip adduction    Hip internal rotation    Hip external rotation    Knee flexion    Knee extension  -25  Ankle dorsiflexion 15 5  Ankle plantarflexion    Ankle inversion    Ankle eversion     (Blank rows = not tested)  LOWER EXTREMITY MMT:    MMT Right Eval Left Eval  Hip flexion 5 4+  Hip extension    Hip abduction    Hip adduction    Hip internal rotation    Hip external rotation    Knee flexion 5 4  Knee extension 5 4  Ankle dorsiflexion 3+ 3+  Ankle plantarflexion    Ankle inversion    Ankle eversion    (Blank rows = not tested)  TRANSFERS: Sit to stand: SBA  Assistive device utilized: None     Stand to sit: SBA  Assistive device utilized: slowed pace, prefers to use UE support and None      GAIT: Findings: Gait Characteristics: step through pattern, decreased step length- Left, decreased ankle dorsiflexion- Left, and poor foot  clearance- Left, Distance walked: 50 ft, Assistive device utilized:None, Level of assistance: SBA, and Comments: reports decreased knowledge of where L foot is with gait  FUNCTIONAL TESTS:  5 times sit to stand: 25.97 sec hands at knees Timed up and go (TUG): 13.72 10 meter walk test: 13.5 sec = 2.43 ft/sec   M-CTSIB  Condition 1: Firm Surface, EO 30 Sec, Normal Sway  Condition 2: Firm Surface, EC 30 Sec, Moderate Sway  Condition 3: Foam Surface, EO 30 Sec, Moderate Sway  Condition 4: Foam Surface, EC 28.25 Sec, Severe Sway  TREATMENT DATE: 10/27/2023    PATIENT EDUCATION: Education details: Eval results, POC Person educated: Patient Education method: Explanation Education comprehension: verbalized understanding  HOME EXERCISE PROGRAM: Access Code: [email or web address] URL: [email or web address] Date: 10/27/2023 Prepared by: [facility] - Outpatient  Rehab - [facility]  Exercises - Heel Toe Raises with Counter Support  - 1-2 x daily - 7 x weekly - 2 sets - 10 reps - Romberg Stance with Head Rotation  - 1-2 x daily - 7 x weekly - 1 sets - 5-10 reps - Romberg Stance with Head Nods  - 1-2 x daily - 7 x weekly - 1 sets - 5-10 reps  GOALS: Goals reviewed with patient? Yes  SHORT TERM GOALS: Target date: 11/25/2023  Pt will be independent with HEP for improved strength, balance, gait. Baseline: Goal status: IN PROGRESS  2.  Pt will improve 5x sit<>stand to less than or equal to 18 sec to demonstrate improved functional strength and transfer efficiency. Baseline: 25.97 sec Goal status: IIN PROGRESS  3.  Pt will improve Condition 4 on MCTSIB to 30 sec mod sway or less for improved balance. Baseline: 28 sec severe sway Goal status: IN PROGRESS  LONG TERM GOALS: Target date: 12/10/2023  Pt will be independent with HEP for improved strength,  balance, gait. Baseline:  Goal status: IN PROGRESS  2.  Pt will improve 5x sit<>stand to less than or equal to 15 sec to demonstrate improved functional strength and transfer efficiency. Baseline: 25.97 sec Goal status: IN PROGRESS  3.  Pt will improve gait velocity to at least 2.62 ft/sec for improved gait efficiency and safety. Baseline: 2.4 ft/sec Goal status: IN PROGRESS  4.  Pt will improve DGI score to at least 19/24 to decrease fall risk. Baseline: 13 Goal status: IN PROGRESS   ASSESSMENT:  CLINICAL IMPRESSION: Patient arrived to session with report of generalized pain after increased since last session. Reviewed stepping strategy exercises with pt demonstrated improved coordinating and slightly improved stability. Additional stepping activities with challenge coordination required demo and verbal cueing. Patient performed quick turns quite well and with good stability. Pt tolerated session well and without complaints upon leaving.   OBJECTIVE IMPAIRMENTS: Abnormal gait, decreased balance, decreased knowledge of use of DME, decreased mobility, difficulty walking, decreased ROM, decreased strength, and impaired flexibility.   ACTIVITY LIMITATIONS: standing, transfers, and locomotion level  PARTICIPATION LIMITATIONS: shopping, community activity, and community fitness and travel  PERSONAL FACTORS: 3+ comorbidities: see PMH are also affecting patient's functional outcome.   REHAB POTENTIAL: Good  CLINICAL DECISION MAKING: Evolving/moderate complexity  EVALUATION COMPLEXITY: Moderate  PLAN:  PT FREQUENCY: 2x/week  PT DURATION: 6 weeks plus eval visit  PLANNED INTERVENTIONS: 97750- Physical Performance Testing, 97110-Therapeutic exercises, 97530- Therapeutic activity, 97112- Neuromuscular re-education, [phone]- Self Care, [id number]- Gait training, Patient/Family education, and Balance training  PLAN FOR NEXT SESSION: progress HEP for LLE strength and for balance, multi-sensory  balance   Louana Terrilyn Christians, PT, DPT 11/09/23 4:17 PM  [facility] at [facility] [address], [address] [place], [place] [address] Phone # [phone] Fax # [phone]

## 2023-11-09 ENCOUNTER — Encounter: Payer: Self-pay | Admitting: Physical Therapy

## 2023-11-09 ENCOUNTER — Ambulatory Visit: Admitting: Physical Therapy

## 2023-11-09 DIAGNOSIS — R2681 Unsteadiness on feet: Secondary | ICD-10-CM | POA: Diagnosis not present

## 2023-11-09 DIAGNOSIS — M6281 Muscle weakness (generalized): Secondary | ICD-10-CM

## 2023-11-09 DIAGNOSIS — R2689 Other abnormalities of gait and mobility: Secondary | ICD-10-CM

## 2023-11-11 ENCOUNTER — Ambulatory Visit

## 2023-11-11 DIAGNOSIS — R2681 Unsteadiness on feet: Secondary | ICD-10-CM | POA: Diagnosis not present

## 2023-11-11 DIAGNOSIS — M6281 Muscle weakness (generalized): Secondary | ICD-10-CM

## 2023-11-11 DIAGNOSIS — R2689 Other abnormalities of gait and mobility: Secondary | ICD-10-CM

## 2023-11-11 NOTE — Therapy (Signed)
 OUTPATIENT PHYSICAL THERAPY NEURO TREATMENT   Patient Name: Jenny [name] MRN: [id number] DOB:1945/11/27, 78 y.o., female Today's Date: 11/11/2023   PCP: Cleave Curling, MD  REFERRING PROVIDER: Cleave Curling, MD   END OF SESSION:  PT End of Session - 11/11/23 1533     Visit Number 5    Number of Visits 13    Date for PT Re-Evaluation 12/10/23    Authorization Type Medicare/BCBS    Progress Note Due on Visit 10    PT Start Time 1530    PT Stop Time 1615    PT Time Calculation (min) 45 min    Activity Tolerance Patient tolerated treatment well    Behavior During Therapy WFL for tasks assessed/performed             Past Medical History:  Diagnosis Date   Allergy    Anemia    Anxiety    Arthritis    generalized   Bursitis of both hips    Cancer (HCC) 2010   Melanoma Left Arm   Cataract 2016,2017   Chronic kidney disease    Depression    Hyperlipidemia    Hypertension    Neuromuscular disorder (HCC)    Peripheral Nueropathy ( Left Side)   Plantar fasciitis    PONV (postoperative nausea and vomiting)    2014 (per pt, hard time waking up)   Preop cardiovascular exam 12/17/2016   Sleep apnea    Past Surgical History:  Procedure Laterality Date   BACK SURGERY  05/04/2013   Cervical Discectomy with Fusion by Dr.McGovern   BREAST SURGERY  1980   fibroadenoma   CATARACT EXTRACTION Left 06/14/2015   CATARACT EXTRACTION Right 05/08/2015   CERVICAL SPINE SURGERY  2014   spinal fusion   EYE SURGERY Bilateral    Yag procedure both eyes   MELANOMA EXCISION Left 2010   left arm   TONSILLECTOMY     removed at age 78   TRANSFORAMINAL LUMBAR INTERBODY FUSION (TLIF) WITH PEDICLE SCREW FIXATION 1 LEVEL N/A 04/11/2020   Procedure: TRANSFORAMINAL LUMBAR INTERBODY FUSION (TLIF) L5-S1, L4-5 LEFT LAMINOTOMY/ DECOMPRESSION;  Surgeon: Mort Ards, MD;  Location: [facility] OR;  Service: Orthopedics;  Laterality: N/A;  5 hrs   TUBAL LIGATION     Patient Active Problem List    Diagnosis Date Noted   History of foot fracture 10/04/2023   Recurrent falls 10/04/2023   Imbalance 10/04/2023   Memory changes 10/04/2023   Grief 04/06/2023   Adjustment insomnia 04/06/2023   Bilateral hand pain 04/06/2023   S/P lumbar fusion 04/11/2020   Pre-op evaluation 03/07/2020   Abnormal EKG 03/07/2020   Blood glucose abnormal 03/21/2019   Cardiovascular stress test abnormal 03/21/2019   Cervical disc disorder 03/21/2019   Disc disorder of lumbar region 03/21/2019   Depressive disorder 03/21/2019   Disorder of bone and articular cartilage 03/21/2019   Hyperlipidemia 03/21/2019   Impaired fasting glucose 03/21/2019   Mammogram abnormal 03/21/2019   Numbness of lower limb 03/21/2019   Osteopenia 03/21/2019   Seborrheic keratosis 03/21/2019   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   Varicose veins of lower extremity 03/21/2019   Vitamin D  deficiency 03/21/2019   Spinal stenosis of lumbar region 01/20/2019   Bursitis of left hip 01/03/2019   Peripheral neuropathic pain 01/03/2019   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   Need for immunization against influenza 03/16/2018   Proteinuria 09/29/2017   Chronic kidney disease, 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   Overweight (BMI 25.0-29.9) 04/12/2017  Preop cardiovascular exam 12/17/2016   Prediabetes 12/17/2016   Obstructive sleep apnea syndrome 04/08/2015    ONSET DATE: 10/04/2023 (MD referral)  REFERRING DIAG:  R29.6 (ICD-10-CM) - Recurrent falls  R26.89 (ICD-10-CM) - Imbalance    THERAPY DIAG:  Unsteadiness on feet  Other abnormalities of gait and mobility  Muscle weakness (generalized)  Rationale for Evaluation and Treatment: Rehabilitation  SUBJECTIVE:                                                                                                                                                                                             SUBJECTIVE STATEMENT: Went ot [place]  and  walked over 9,000 steps. Was on the boat a lot and noticed some remaining sensation of rocking after getting off the boat but it was not too bad.    Pt accompanied by: self  PERTINENT HISTORY: plantar fascitis, back pain, hx of back surgery, neck surgery, neuropathy on LLE, falls with 5th metatarsal fracture, vertigo  PAIN:  Are you having pain? Yes: NPRS scale: 4-6/10 Pain location: LBP and generalized pain Pain description: achy Aggravating factors: walking Relieving factors: time  PRECAUTIONS: Fall  RED FLAGS: None   WEIGHT BEARING RESTRICTIONS: No  FALLS: Has patient fallen in last 6 months? Yes. Number of falls 2  LIVING ENVIRONMENT: Lives with: lives with their spouse and splits time between [place] and [place]  Lives in: House/apartment Stairs: 4 steps to enter and one level in [place]; flight of steps to bedroom in [place] Has following equipment at home: bilateral walking poles  PLOF: Independent  PATIENT GOALS: To try to prevent future falls  OBJECTIVE:   TODAY'S TREATMENT: 11/11/23 Activity Comments  HEP review --progressed head movements to eyes closed  Discussion/education regarding whole body vibration for neuropathic pain   Static multisensory balance On foam  Dynamic balance To facilitate single limb support and righting reactions            TODAY'S TREATMENT: 11/09/23 Activity Comments  fwd/back stepping  Improved coordination and rhythm with steps; cues for wider BOS; requires occasional UE support   fwd/back stepping + head nods Cueing for coordinating head movements; requires consistent 1 UE support   walking with EC along counter 1 UE support; cues for wider stance which improved stability   grapevine walk B UE support; pt required consistent cueing to coordinate steps in intended pattern  1/2 turns in doorway  Encouraged quick movements; good stability            HOME EXERCISE PROGRAM Last updated: 11/09/23 Access Code: [email or web address] URL:  [email or web address] Date: 11/09/2023 Prepared by: [facility] - Outpatient  Rehab - [facility]  Exercises - Heel Toe Raises with Counter Support  - 1-2 x daily - 7 x weekly - 2 sets - 10 reps - Romberg Stance with Head Rotation  - 1-2 x daily - 7 x weekly - 1 sets - 5-10 reps - Romberg Stance with Head Nods  - 1-2 x daily - 7 x weekly - 1 sets - 5-10 reps - Alternating Step Forward with Support  - 1 x daily - 5 x weekly - 2 sets - 10 reps - Standing Gastroc Stretch at Counter  - 1 x daily - 5 x weekly - 2 sets - 30 sec hold - Carioca with Counter Support  - 1 x daily - 5 x weekly - 2 sets - 6 reps    PATIENT EDUCATION: Education details: HEP update with cues for safety, discussed pt's use of transport chair for longer distances  Person educated: Patient Education method: Explanation, Demonstration, Tactile cues, Verbal cues, and Handouts Education comprehension: verbalized understanding and returned demonstration    Note: Objective measures were completed at Evaluation unless otherwise noted.  DIAGNOSTIC FINDINGS: NA for this episode  COGNITION: Overall cognitive status: Within functional limits for tasks assessed   SENSATION: Light touch: WFL per patient report  POSTURE: rounded shoulders and forward head  LOWER EXTREMITY ROM:     Active  Right Eval Left Eval  Hip flexion    Hip extension    Hip abduction    Hip adduction    Hip internal rotation    Hip external rotation    Knee flexion    Knee extension  -25  Ankle dorsiflexion 15 5  Ankle plantarflexion    Ankle inversion    Ankle eversion     (Blank rows = not tested)  LOWER EXTREMITY MMT:    MMT Right Eval Left Eval  Hip flexion 5 4+  Hip extension    Hip abduction    Hip adduction    Hip internal rotation    Hip external rotation    Knee flexion 5 4  Knee extension 5 4  Ankle dorsiflexion 3+ 3+  Ankle plantarflexion    Ankle inversion    Ankle eversion    (Blank rows =  not tested)  TRANSFERS: Sit to stand: SBA  Assistive device utilized: None     Stand to sit: SBA  Assistive device utilized: slowed pace, prefers to use UE support and None      GAIT: Findings: Gait Characteristics: step through pattern, decreased step length- Left, decreased ankle dorsiflexion- Left, and poor foot clearance- Left, Distance walked: 50 ft, Assistive device utilized:None, Level of assistance: SBA, and Comments: reports decreased knowledge of where L foot is with gait  FUNCTIONAL TESTS:  5 times sit to stand: 25.97 sec hands at knees Timed up and go (TUG): 13.72 10 meter walk test: 13.5 sec = 2.43 ft/sec   M-CTSIB  Condition 1: Firm Surface, EO 30 Sec, Normal Sway  Condition 2: Firm Surface, EC 30 Sec, Moderate Sway  Condition 3: Foam Surface, EO 30 Sec, Moderate Sway  Condition 4: Foam Surface, EC 28.25 Sec, Severe Sway  TREATMENT DATE: 10/27/2023     GOALS: Goals reviewed with patient? Yes  SHORT TERM GOALS: Target date: 11/25/2023  Pt will be independent with HEP for improved strength, balance, gait. Baseline: Goal status: IN PROGRESS  2.  Pt will improve 5x sit<>stand to less than or equal to 18 sec to demonstrate improved functional strength and transfer efficiency. Baseline: 25.97 sec Goal status: IIN PROGRESS  3.  Pt will improve Condition 4 on MCTSIB to 30 sec mod sway or less for improved balance. Baseline: 28 sec severe sway Goal status: IN PROGRESS  LONG TERM GOALS: Target date: 12/10/2023  Pt will be independent with HEP for improved strength, balance, gait. Baseline:  Goal status: IN PROGRESS  2.  Pt will improve 5x sit<>stand to less than or equal to 15 sec to demonstrate improved functional strength and transfer efficiency. Baseline: 25.97 sec Goal status: IN PROGRESS  3.  Pt will improve gait velocity to at least  2.62 ft/sec for improved gait efficiency and safety. Baseline: 2.4 ft/sec Goal status: IN PROGRESS  4.  Pt will improve DGI score to at least 19/24 to decrease fall risk. Baseline: 13 Goal status: IN PROGRESS   ASSESSMENT:  CLINICAL IMPRESSION: Excellent recall to HEP with brief demo for forward step/shfit activity with good carryover thereafter. Progressed static balance with head turns and eyes closed to improve postural stability. Treatement session focus to multisensory balance demands to facilitate reactive and proactive balance strategies. Dynamic balance for single limb support and reactive balance strategies. Education regarding whole body vibration therapy/exercise for neuropathic pain as well as mgmt for chronic LBP and provided reference materials for research. Continued sessions to advance POC details to improve balance and reduce ris for fals  OBJECTIVE IMPAIRMENTS: Abnormal gait, decreased balance, decreased knowledge of use of DME, decreased mobility, difficulty walking, decreased ROM, decreased strength, and impaired flexibility.   ACTIVITY LIMITATIONS: standing, transfers, and locomotion level  PARTICIPATION LIMITATIONS: shopping, community activity, and community fitness and travel  PERSONAL FACTORS: 3+ comorbidities: see PMH are also affecting patient's functional outcome.   REHAB POTENTIAL: Good  CLINICAL DECISION MAKING: Evolving/moderate complexity  EVALUATION COMPLEXITY: Moderate  PLAN:  PT FREQUENCY: 2x/week  PT DURATION: 6 weeks plus eval visit  PLANNED INTERVENTIONS: 97750- Physical Performance Testing, 97110-Therapeutic exercises, 97530- Therapeutic activity, [id number]- Neuromuscular re-education, 97535- Self Care, 16109- Gait training, Patient/Family education, and Balance training  PLAN FOR NEXT SESSION: progress HEP for LLE strength and for balance, multi-sensory balance   4:17 PM, 11/11/23 M. Kelly Stela Iwasaki, PT, DPT Physical Therapist-  [place] Office Number: [phone]

## 2023-11-16 ENCOUNTER — Ambulatory Visit: Admitting: Physical Therapy

## 2023-11-16 ENCOUNTER — Encounter: Payer: Self-pay | Admitting: Physical Therapy

## 2023-11-16 DIAGNOSIS — R2681 Unsteadiness on feet: Secondary | ICD-10-CM

## 2023-11-16 DIAGNOSIS — R2689 Other abnormalities of gait and mobility: Secondary | ICD-10-CM

## 2023-11-16 DIAGNOSIS — M6281 Muscle weakness (generalized): Secondary | ICD-10-CM

## 2023-11-16 NOTE — Therapy (Unsigned)
 OUTPATIENT PHYSICAL THERAPY NEURO TREATMENT   Patient Name: Jenny [name] MRN: [id number] DOB:[birth year], 78 y.o., female Today's Date: 11/17/2023   PCP: Jarold Medici, MD  REFERRING PROVIDER: Jarold Medici, MD   END OF SESSION:  PT End of Session - 11/16/23 1327     Visit Number 6    Number of Visits 13    Date for PT Re-Evaluation 12/10/23    Authorization Type Medicare/BCBS    Progress Note Due on Visit 10    PT Start Time 1324    PT Stop Time 1402    PT Time Calculation (min) 38 min    Activity Tolerance Patient tolerated treatment well    Behavior During Therapy WFL for tasks assessed/performed             Past Medical History:  Diagnosis Date   Allergy    Anemia    Anxiety    Arthritis    generalized   Bursitis of both hips    Cancer (HCC) 2010   Melanoma Left Arm   Cataract 2016,2017   Chronic kidney disease    Depression    Hyperlipidemia    Hypertension    Neuromuscular disorder (HCC)    Peripheral Nueropathy ( Left Side)   Plantar fasciitis    PONV (postoperative nausea and vomiting)    2014 (per pt, hard time waking up)   Preop cardiovascular exam 12/17/2016   Sleep apnea    Past Surgical History:  Procedure Laterality Date   BACK SURGERY  05/04/2013   Cervical Discectomy with Fusion by Dr.McGovern   BREAST SURGERY  1980   fibroadenoma   CATARACT EXTRACTION Left 06/14/2015   CATARACT EXTRACTION Right 05/08/2015   CERVICAL SPINE SURGERY  2014   spinal fusion   EYE SURGERY Bilateral    Yag procedure both eyes   MELANOMA EXCISION Left 2010   left arm   TONSILLECTOMY     removed at age 34   TRANSFORAMINAL LUMBAR INTERBODY FUSION (TLIF) WITH PEDICLE SCREW FIXATION 1 LEVEL N/A 04/11/2020   Procedure: TRANSFORAMINAL LUMBAR INTERBODY FUSION (TLIF) L5-S1, L4-5 LEFT LAMINOTOMY/ DECOMPRESSION;  Surgeon: Burnetta Aures, MD;  Location: [facility] OR;  Service: Orthopedics;  Laterality: N/A;  5 hrs   TUBAL LIGATION     Patient Active Problem List    Diagnosis Date Noted   History of foot fracture 10/04/2023   Recurrent falls 10/04/2023   Imbalance 10/04/2023   Memory changes 10/04/2023   Grief 04/06/2023   Adjustment insomnia 04/06/2023   Bilateral hand pain 04/06/2023   S/P lumbar fusion 04/11/2020   Pre-op evaluation 03/07/2020   Abnormal EKG 03/07/2020   Blood glucose abnormal 03/21/2019   Cardiovascular stress test abnormal 03/21/2019   Cervical disc disorder 03/21/2019   Disc disorder of lumbar region 03/21/2019   Depressive disorder 03/21/2019   Disorder of bone and articular cartilage 03/21/2019   Hyperlipidemia 03/21/2019   Impaired fasting glucose 03/21/2019   Mammogram abnormal 03/21/2019   Numbness of lower limb 03/21/2019   Osteopenia 03/21/2019   Seborrheic keratosis 03/21/2019   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   Varicose veins of lower extremity 03/21/2019   Vitamin D  deficiency 03/21/2019   Spinal stenosis of lumbar region 01/20/2019   Bursitis of left hip 01/03/2019   Peripheral neuropathic pain 01/03/2019   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   Need for immunization against influenza 03/16/2018   Proteinuria 09/29/2017   Chronic kidney disease, 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   Overweight (BMI 25.0-29.9) 04/12/2017  Preop cardiovascular exam 12/17/2016   Prediabetes 12/17/2016   Obstructive sleep apnea syndrome 04/08/2015    ONSET DATE: 10/04/2023 (MD referral)  REFERRING DIAG:  R29.6 (ICD-10-CM) - Recurrent falls  R26.89 (ICD-10-CM) - Imbalance    THERAPY DIAG:  Unsteadiness on feet  Other abnormalities of gait and mobility  Muscle weakness (generalized)  Rationale for Evaluation and Treatment: Rehabilitation  SUBJECTIVE:                                                                                                                                                                                             SUBJECTIVE STATEMENT: Had a great trip.   Pt  accompanied by: self  PERTINENT HISTORY: plantar fascitis, back pain, hx of back surgery, neck surgery, neuropathy on LLE, falls with 5th metatarsal fracture, vertigo  PAIN:  Are you having pain? Yes: NPRS scale: 4-6/10 Pain location: LBP and generalized pain Pain description: achy Aggravating factors: walking Relieving factors: time  PRECAUTIONS: Fall  RED FLAGS: None   WEIGHT BEARING RESTRICTIONS: No  FALLS: Has patient fallen in last 6 months? Yes. Number of falls 2  LIVING ENVIRONMENT: Lives with: lives with their spouse and splits time between [place] and [place]  Lives in: House/apartment Stairs: 4 steps to enter and one level in [place]; flight of steps to bedroom in [place] Has following equipment at home: bilateral walking poles  PLOF: Independent  PATIENT GOALS: To try to prevent future falls  OBJECTIVE:    TODAY'S TREATMENT: 11/16/2023 Activity Comments  HEP Review Answered some of pt's questions  Review of progression of Romberg + EC+ head turns/nods   Walk along counter-EC, 6 reps UE support and better with slowed motion  On [place]ire[place]:  marching in place Heel/toe raise Side step and weightshift Forward step and weightshift Back step and weightshift Forward<>back step and weightshift   Backwards walking           HOME EXERCISE PROGRAM Last updated: 11/09/23 Access Code: [email or web address] URL: [email or web address] Date: 11/09/2023 Prepared by: [facility] - Outpatient  Rehab - [facility]  Exercises - Heel Toe Raises with Counter Support  - 1-2 x daily - 7 x weekly - 2 sets - 10 reps - Romberg Stance with Head Rotation  - 1-2 x daily - 7 x weekly - 1 sets - 5-10 reps - Romberg Stance with Head Nods  - 1-2 x daily - 7 x weekly - 1 sets - 5-10 reps - Alternating Step Forward with Support  - 1 x daily - 5 x weekly - 2 sets - 10 reps - Standing Gastroc Stretch at Counter  - 1 x  daily - 5 x weekly - 2 sets - 30 sec hold - Carioca with Counter Support  - 1 x  daily - 5 x weekly - 2 sets - 6 reps    PATIENT EDUCATION: Education details: Review of HEP Person educated: Patient Education method: Explanation, Demonstration, Tactile cues, Verbal cues, and Handouts Education comprehension: verbalized understanding and returned demonstration    Note: Objective measures were completed at Evaluation unless otherwise noted.  DIAGNOSTIC FINDINGS: NA for this episode  COGNITION: Overall cognitive status: Within functional limits for tasks assessed   SENSATION: Light touch: WFL per patient report  POSTURE: rounded shoulders and forward head  LOWER EXTREMITY ROM:     Active  Right Eval Left Eval  Hip flexion    Hip extension    Hip abduction    Hip adduction    Hip internal rotation    Hip external rotation    Knee flexion    Knee extension  -25  Ankle dorsiflexion 15 5  Ankle plantarflexion    Ankle inversion    Ankle eversion     (Blank rows = not tested)  LOWER EXTREMITY MMT:    MMT Right Eval Left Eval  Hip flexion 5 4+  Hip extension    Hip abduction    Hip adduction    Hip internal rotation    Hip external rotation    Knee flexion 5 4  Knee extension 5 4  Ankle dorsiflexion 3+ 3+  Ankle plantarflexion    Ankle inversion    Ankle eversion    (Blank rows = not tested)  TRANSFERS: Sit to stand: SBA  Assistive device utilized: None     Stand to sit: SBA  Assistive device utilized: slowed pace, prefers to use UE support and None      GAIT: Findings: Gait Characteristics: step through pattern, decreased step length- Left, decreased ankle dorsiflexion- Left, and poor foot clearance- Left, Distance walked: 50 ft, Assistive device utilized:None, Level of assistance: SBA, and Comments: reports decreased knowledge of where L foot is with gait  FUNCTIONAL TESTS:  5 times sit to stand: 25.97 sec hands at knees Timed up and go (TUG): 13.72 10 meter walk test: 13.5 sec = 2.43 ft/sec   M-CTSIB  Condition 1: Firm Surface,  EO 30 Sec, Normal Sway  Condition 2: Firm Surface, EC 30 Sec, Moderate Sway  Condition 3: Foam Surface, EO 30 Sec, Moderate Sway  Condition 4: Foam Surface, EC 28.25 Sec, Severe Sway                                                                                                                                TREATMENT DATE: 10/27/2023     GOALS: Goals reviewed with patient? Yes  SHORT TERM GOALS: Target date: 11/25/2023  Pt will be independent with HEP for improved strength, balance, gait. Baseline: Goal status: IN PROGRESS  2.  Pt will improve 5x sit<>stand to less  than or equal to 18 sec to demonstrate improved functional strength and transfer efficiency. Baseline: 25.97 sec Goal status: IIN PROGRESS  3.  Pt will improve Condition 4 on MCTSIB to 30 sec mod sway or less for improved balance. Baseline: 28 sec severe sway Goal status: IN PROGRESS  LONG TERM GOALS: Target date: 12/10/2023  Pt will be independent with HEP for improved strength, balance, gait. Baseline:  Goal status: IN PROGRESS  2.  Pt will improve 5x sit<>stand to less than or equal to 15 sec to demonstrate improved functional strength and transfer efficiency. Baseline: 25.97 sec Goal status: IN PROGRESS  3.  Pt will improve gait velocity to at least 2.62 ft/sec for improved gait efficiency and safety. Baseline: 2.4 ft/sec Goal status: IN PROGRESS  4.  Pt will improve DGI score to at least 19/24 to decrease fall risk. Baseline: 13 Goal status: IN PROGRESS   ASSESSMENT:  CLINICAL IMPRESSION: Pt presents today with no new complaints.  Reviewed several questions from pt's HEP and pt continues to do well with these exercises; she endorses that she uses the papers at home, so that is most helpful.  Continued to address high level balance. Pt will continue to benefit from skilled PT towards goals for improved functional mobility and decreased fall risk.   OBJECTIVE IMPAIRMENTS: Abnormal gait, decreased balance,  decreased knowledge of use of DME, decreased mobility, difficulty walking, decreased ROM, decreased strength, and impaired flexibility.   ACTIVITY LIMITATIONS: standing, transfers, and locomotion level  PARTICIPATION LIMITATIONS: shopping, community activity, and community fitness and travel  PERSONAL FACTORS: 3+ comorbidities: see PMH are also affecting patient's functional outcome.   REHAB POTENTIAL: Good  CLINICAL DECISION MAKING: Evolving/moderate complexity  EVALUATION COMPLEXITY: Moderate  PLAN:  PT FREQUENCY: 2x/week  PT DURATION: 6 weeks plus eval visit  PLANNED INTERVENTIONS: 97750- Physical Performance Testing, 97110-Therapeutic exercises, 97530- Therapeutic activity, [id number]- Neuromuscular re-education, 97535- Self Care, [id number]- Gait training, Patient/Family education, and Balance training  PLAN FOR NEXT SESSION: Check STGs and make sure pt has appointments through POC.  Continue to progress HEP for LLE strength and for balance, multi-sensory balance   Greig Anon, PT 11/17/23 11:57 AM Phone: [phone] Fax: [phone]  [facility] [address] [place], [address] [place], [place] [address] Phone # [phone] Fax # [phone]

## 2023-11-18 ENCOUNTER — Encounter: Payer: Self-pay | Admitting: Physical Therapy

## 2023-11-18 ENCOUNTER — Ambulatory Visit: Admitting: Physical Therapy

## 2023-11-18 DIAGNOSIS — M6281 Muscle weakness (generalized): Secondary | ICD-10-CM

## 2023-11-18 DIAGNOSIS — R2681 Unsteadiness on feet: Secondary | ICD-10-CM | POA: Diagnosis not present

## 2023-11-18 DIAGNOSIS — R2689 Other abnormalities of gait and mobility: Secondary | ICD-10-CM

## 2023-11-18 NOTE — Therapy (Addendum)
 OUTPATIENT PHYSICAL THERAPY NEURO TREATMENT   Patient Name: Jenny [name] MRN: [id number] DOB:[birth year], 78 y.o., female Today's Date: 11/19/2023   PCP: Jarold Medici, MD  REFERRING PROVIDER: Jarold Medici, MD   END OF SESSION:  PT End of Session - 11/18/23 1452     Visit Number 7    Number of Visits 13    Date for PT Re-Evaluation 12/10/23    Authorization Type Medicare/BCBS    Progress Note Due on Visit 10    PT Start Time 1450    PT Stop Time 1528    PT Time Calculation (min) 38 min    Activity Tolerance Patient tolerated treatment well    Behavior During Therapy WFL for tasks assessed/performed              Past Medical History:  Diagnosis Date   Allergy    Anemia    Anxiety    Arthritis    generalized   Bursitis of both hips    Cancer (HCC) 2010   Melanoma Left Arm   Cataract 2016,2017   Chronic kidney disease    Depression    Hyperlipidemia    Hypertension    Neuromuscular disorder (HCC)    Peripheral Nueropathy ( Left Side)   Plantar fasciitis    PONV (postoperative nausea and vomiting)    2014 (per pt, hard time waking up)   Preop cardiovascular exam 12/17/2016   Sleep apnea    Past Surgical History:  Procedure Laterality Date   BACK SURGERY  05/04/2013   Cervical Discectomy with Fusion by Dr.McGovern   BREAST SURGERY  1980   fibroadenoma   CATARACT EXTRACTION Left 06/14/2015   CATARACT EXTRACTION Right 05/08/2015   CERVICAL SPINE SURGERY  2014   spinal fusion   EYE SURGERY Bilateral    Yag procedure both eyes   MELANOMA EXCISION Left 2010   left arm   TONSILLECTOMY     removed at age 59   TRANSFORAMINAL LUMBAR INTERBODY FUSION (TLIF) WITH PEDICLE SCREW FIXATION 1 LEVEL N/A 04/11/2020   Procedure: TRANSFORAMINAL LUMBAR INTERBODY FUSION (TLIF) L5-S1, L4-5 LEFT LAMINOTOMY/ DECOMPRESSION;  Surgeon: Burnetta Aures, MD;  Location: [facility] OR;  Service: Orthopedics;  Laterality: N/A;  5 hrs   TUBAL LIGATION     Patient Active Problem List    Diagnosis Date Noted   History of foot fracture 10/04/2023   Recurrent falls 10/04/2023   Imbalance 10/04/2023   Memory changes 10/04/2023   Grief 04/06/2023   Adjustment insomnia 04/06/2023   Bilateral hand pain 04/06/2023   S/P lumbar fusion 04/11/2020   Pre-op evaluation 03/07/2020   Abnormal EKG 03/07/2020   Blood glucose abnormal 03/21/2019   Cardiovascular stress test abnormal 03/21/2019   Cervical disc disorder 03/21/2019   Disc disorder of lumbar region 03/21/2019   Depressive disorder 03/21/2019   Disorder of bone and articular cartilage 03/21/2019   Hyperlipidemia 03/21/2019   Impaired fasting glucose 03/21/2019   Mammogram abnormal 03/21/2019   Numbness of lower limb 03/21/2019   Osteopenia 03/21/2019   Seborrheic keratosis 03/21/2019   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   Varicose veins of lower extremity 03/21/2019   Vitamin D  deficiency 03/21/2019   Spinal stenosis of lumbar region 01/20/2019   Bursitis of left hip 01/03/2019   Peripheral neuropathic pain 01/03/2019   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   Need for immunization against influenza 03/16/2018   Proteinuria 09/29/2017   Chronic kidney disease, 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   Overweight (BMI 25.0-29.9) 04/12/2017  Preop cardiovascular exam 12/17/2016   Prediabetes 12/17/2016   Obstructive sleep apnea syndrome 04/08/2015    ONSET DATE: 10/04/2023 (MD referral)  REFERRING DIAG:  R29.6 (ICD-10-CM) - Recurrent falls  R26.89 (ICD-10-CM) - Imbalance    THERAPY DIAG:  Unsteadiness on feet  Other abnormalities of gait and mobility  Muscle weakness (generalized)  Rationale for Evaluation and Treatment: Rehabilitation  SUBJECTIVE:                                                                                                                                                                                             SUBJECTIVE STATEMENT: No new changes, just the heat  is bad.  The nodding up and down with stepping is still a challenge.  When I do the crossover step, it bothers my hip (bursitis)-maybe I did it too much   Pt accompanied by: self  PERTINENT HISTORY: plantar fascitis, back pain, hx of back surgery, neck surgery, neuropathy on LLE, falls with 5th metatarsal fracture, vertigo  PAIN:  Are you having pain? Yes: NPRS scale: 1/10 Pain location: LBP and generalized pain Pain description: achy Aggravating factors: walking Relieving factors: time  PRECAUTIONS: Fall  RED FLAGS: None   WEIGHT BEARING RESTRICTIONS: No  FALLS: Has patient fallen in last 6 months? Yes. Number of falls 2  LIVING ENVIRONMENT: Lives with: lives with their spouse and splits time between [place] and [place]  Lives in: House/apartment Stairs: 4 steps to enter and one level in ; flight of steps to bedroom in [place] Has following equipment at home: bilateral walking poles  PLOF: Independent  PATIENT GOALS: To try to prevent future falls  OBJECTIVE:    TODAY'S TREATMENT: 11/18/2023 Activity Comments  Reviewed step forward/back + head nods Min cues to get set for the technique  Sidestepping x 1 min No UE support, good form-c/o mild pain at bilat hips/greater trochanter  Discussed hx of hip pain/bursitis Hip abd MMT R:  4/5, L 4-/5 Palpated IT band with palpable tightness/tenderness greater trochanter to mid thigh Discussed avoiding excess motions that irritate; use of ice massage/roller to IT band  Gait with head turns/nods Some unsteadiness with head turns > head nods  Obstacle negotiation around and over low obstacles No LOB, prefers stepping over obstacles with RLE leading      HOME EXERCISE PROGRAM Access Code: [email or web address] URL: [email or web address] Date: 11/18/2023 Prepared by: [facility] - Outpatient  Rehab - [facility]  Exercises - Heel Toe Raises with Counter Support  - 1-2 x daily - 7 x weekly - 2 sets - 10 reps - Romberg Stance with  Head Rotation  - 1-2  x daily - 7 x weekly - 1 sets - 5-10 reps - Romberg Stance with Head Nods  - 1-2 x daily - 7 x weekly - 1 sets - 5-10 reps - Alternating Step Forward with Support  - 1 x daily - 5 x weekly - 2 sets - 10 reps - Standing Gastroc Stretch at Counter  - 1 x daily - 5 x weekly - 2 sets - 30 sec hold - Carioca with Counter Support  - 1 x daily - 5 x weekly - 2 sets - 6 reps - Romberg Stance with Eyes Closed  - 1 x daily - 7 x weekly - 3 sets - 5 reps - Walking with Head Rotation  - 1 x daily - 7 x weekly - 1 sets - 3 reps        PATIENT EDUCATION: Education details: Review/progression of HEP, discussed IT band tightness, tenderness at greater trochanter/hx of bursitis and how to address (see above); discussed POC and pt feels she will be ready to discharge next week Person educated: Patient Education method: Explanation, Demonstration, Tactile cues, Verbal cues, and Handouts Education comprehension: verbalized understanding and returned demonstration    Note: Objective measures were completed at Evaluation unless otherwise noted.  DIAGNOSTIC FINDINGS: NA for this episode  COGNITION: Overall cognitive status: Within functional limits for tasks assessed   SENSATION: Light touch: WFL per patient report  POSTURE: rounded shoulders and forward head  LOWER EXTREMITY ROM:     Active  Right Eval Left Eval  Hip flexion    Hip extension    Hip abduction    Hip adduction    Hip internal rotation    Hip external rotation    Knee flexion    Knee extension  -25  Ankle dorsiflexion 15 5  Ankle plantarflexion    Ankle inversion    Ankle eversion     (Blank rows = not tested)  LOWER EXTREMITY MMT:    MMT Right Eval Left Eval  Hip flexion 5 4+  Hip extension    Hip abduction    Hip adduction    Hip internal rotation    Hip external rotation    Knee flexion 5 4  Knee extension 5 4  Ankle dorsiflexion 3+ 3+  Ankle plantarflexion    Ankle inversion     Ankle eversion    (Blank rows = not tested)  TRANSFERS: Sit to stand: SBA  Assistive device utilized: None     Stand to sit: SBA  Assistive device utilized: slowed pace, prefers to use UE support and None      GAIT: Findings: Gait Characteristics: step through pattern, decreased step length- Left, decreased ankle dorsiflexion- Left, and poor foot clearance- Left, Distance walked: 50 ft, Assistive device utilized:None, Level of assistance: SBA, and Comments: reports decreased knowledge of where L foot is with gait  FUNCTIONAL TESTS:  5 times sit to stand: 25.97 sec hands at knees Timed up and go (TUG): 13.72 10 meter walk test: 13.5 sec = 2.43 ft/sec   M-CTSIB  Condition 1: Firm Surface, EO 30 Sec, Normal Sway  Condition 2: Firm Surface, EC 30 Sec, Moderate Sway  Condition 3: Foam Surface, EO 30 Sec, Moderate Sway  Condition 4: Foam Surface, EC 28.25 Sec, Severe Sway  TREATMENT DATE: 10/27/2023     GOALS: Goals reviewed with patient? Yes  SHORT TERM GOALS: Target date: 11/25/2023  Pt will be independent with HEP for improved strength, balance, gait. Baseline: Goal status: IN PROGRESS  2.  Pt will improve 5x sit<>stand to less than or equal to 18 sec to demonstrate improved functional strength and transfer efficiency. Baseline: 25.97 sec Goal status: IIN PROGRESS  3.  Pt will improve Condition 4 on MCTSIB to 30 sec mod sway or less for improved balance. Baseline: 28 sec severe sway Goal status: IN PROGRESS  LONG TERM GOALS: Target date: 12/10/2023  Pt will be independent with HEP for improved strength, balance, gait. Baseline:  Goal status: IN PROGRESS  2.  Pt will improve 5x sit<>stand to less than or equal to 15 sec to demonstrate improved functional strength and transfer efficiency. Baseline: 25.97 sec Goal status: IN PROGRESS  3.  Pt will  improve gait velocity to at least 2.62 ft/sec for improved gait efficiency and safety. Baseline: 2.4 ft/sec Goal status: IN PROGRESS  4.  Pt will improve DGI score to at least 19/24 to decrease fall risk. Baseline: 13 Goal status: IN PROGRESS   ASSESSMENT:  CLINICAL IMPRESSION: Pt presents today with no new reports, other than some mild discomfort in her hips after doing the crossover steps exercise.  She does endorse she may have overdone it, but she also has hx of bursitis at hips.  She is tender to palpation and has some hip abductor weakness and IT band tightness.  Discussed use of ice, massage and avoiding overdoing the crossover step exercise. Otherwise, worked on balance and progressed to gait with head motions, adding to HEP. She feels she is progressing well and feels she may be ready for discharge next week.  OBJECTIVE IMPAIRMENTS: Abnormal gait, decreased balance, decreased knowledge of use of DME, decreased mobility, difficulty walking, decreased ROM, decreased strength, and impaired flexibility.   ACTIVITY LIMITATIONS: standing, transfers, and locomotion level  PARTICIPATION LIMITATIONS: shopping, community activity, and community fitness and travel  PERSONAL FACTORS: 3+ comorbidities: see PMH are also affecting patient's functional outcome.   REHAB POTENTIAL: Good  CLINICAL DECISION MAKING: Evolving/moderate complexity  EVALUATION COMPLEXITY: Moderate  PLAN:  PT FREQUENCY: 2x/week  PT DURATION: 6 weeks plus eval visit  PLANNED INTERVENTIONS: 97750- Physical Performance Testing, 97110-Therapeutic exercises, 97530- Therapeutic activity, [id number]- Neuromuscular re-education, 97535- Self Care, [id number]- Gait training, Patient/Family education, and Balance training  PLAN FOR NEXT SESSION: Check STGs/LTGs and discuss discharge, as pt feels she would be ready to d/c next week.    Greig Anon, PT 11/19/23 10:48 AM Phone: [phone] Fax: [phone]  [facility]  Outpatient Rehab at [facility] [address] [place], [address] [place], [place] [address] Phone # [phone] Fax # [phone]

## 2023-11-23 ENCOUNTER — Encounter: Payer: Self-pay | Admitting: Physical Therapy

## 2023-11-23 ENCOUNTER — Ambulatory Visit: Attending: Internal Medicine | Admitting: Physical Therapy

## 2023-11-23 DIAGNOSIS — R2681 Unsteadiness on feet: Secondary | ICD-10-CM | POA: Insufficient documentation

## 2023-11-23 DIAGNOSIS — R2689 Other abnormalities of gait and mobility: Secondary | ICD-10-CM | POA: Diagnosis present

## 2023-11-23 DIAGNOSIS — M6281 Muscle weakness (generalized): Secondary | ICD-10-CM | POA: Diagnosis present

## 2023-11-23 NOTE — Therapy (Signed)
 OUTPATIENT PHYSICAL THERAPY NEURO TREATMENT/DISCHARGE SUMMARY   Patient Name: Jenny [name] MRN: [id number] DOB:[birth year], 78 y.o., female Today's Date: 11/23/2023   PCP: Jarold Medici, MD  REFERRING PROVIDER: Jarold Medici, MD    PHYSICAL THERAPY DISCHARGE SUMMARY  Visits from Start of Care: 8  Current functional level related to goals / functional outcomes: Pt has met all STGs and LTGs-see below   Remaining deficits: High level balance   Education / Equipment: HEP, fall prevention   Patient agrees to discharge. Patient goals were met. Patient is being discharged due to meeting the stated rehab goals.  END OF SESSION:  PT End of Session - 11/23/23 1405     Visit Number 8    Number of Visits 13    Date for PT Re-Evaluation 12/10/23    Authorization Type Medicare/BCBS    Progress Note Due on Visit 10    PT Start Time 1405    PT Stop Time 1440    PT Time Calculation (min) 35 min    Activity Tolerance Patient tolerated treatment well    Behavior During Therapy WFL for tasks assessed/performed               Past Medical History:  Diagnosis Date   Allergy    Anemia    Anxiety    Arthritis    generalized   Bursitis of both hips    Cancer (HCC) 2010   Melanoma Left Arm   Cataract 2016,2017   Chronic kidney disease    Depression    Hyperlipidemia    Hypertension    Neuromuscular disorder (HCC)    Peripheral Nueropathy ( Left Side)   Plantar fasciitis    PONV (postoperative nausea and vomiting)    2014 (per pt, hard time waking up)   Preop cardiovascular exam 12/17/2016   Sleep apnea    Past Surgical History:  Procedure Laterality Date   BACK SURGERY  05/04/2013   Cervical Discectomy with Fusion by Dr.McGovern   BREAST SURGERY  1980   fibroadenoma   CATARACT EXTRACTION Left 06/14/2015   CATARACT EXTRACTION Right 05/08/2015   CERVICAL SPINE SURGERY  2014   spinal fusion   EYE SURGERY Bilateral    Yag procedure both eyes   MELANOMA  EXCISION Left 2010   left arm   TONSILLECTOMY     removed at age 51   TRANSFORAMINAL LUMBAR INTERBODY FUSION (TLIF) WITH PEDICLE SCREW FIXATION 1 LEVEL N/A 04/11/2020   Procedure: TRANSFORAMINAL LUMBAR INTERBODY FUSION (TLIF) L5-S1, L4-5 LEFT LAMINOTOMY/ DECOMPRESSION;  Surgeon: Burnetta Aures, MD;  Location: [facility] OR;  Service: Orthopedics;  Laterality: N/A;  5 hrs   TUBAL LIGATION     Patient Active Problem List   Diagnosis Date Noted   History of foot fracture 10/04/2023   Recurrent falls 10/04/2023   Imbalance 10/04/2023   Memory changes 10/04/2023   Grief 04/06/2023   Adjustment insomnia 04/06/2023   Bilateral hand pain 04/06/2023   S/P lumbar fusion 04/11/2020   Pre-op evaluation 03/07/2020   Abnormal EKG 03/07/2020   Blood glucose abnormal 03/21/2019   Cardiovascular stress test abnormal 03/21/2019   Cervical disc disorder 03/21/2019   Disc disorder of lumbar region 03/21/2019   Depressive disorder 03/21/2019   Disorder of bone and articular cartilage 03/21/2019   Hyperlipidemia 03/21/2019   Impaired fasting glucose 03/21/2019   Mammogram abnormal 03/21/2019   Numbness of lower limb 03/21/2019   Osteopenia 03/21/2019   Seborrheic keratosis 03/21/2019   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  Varicose veins of lower extremity 03/21/2019   Vitamin D  deficiency 03/21/2019   Spinal stenosis of lumbar region 01/20/2019   Bursitis of left hip 01/03/2019   Peripheral neuropathic pain 01/03/2019   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   Need for immunization against influenza 03/16/2018   Proteinuria 09/29/2017   Chronic kidney disease, 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   Overweight (BMI 25.0-29.9) 04/12/2017   Preop cardiovascular exam 12/17/2016   Prediabetes 12/17/2016   Obstructive sleep apnea syndrome 04/08/2015    ONSET DATE: 10/04/2023 (MD referral)  REFERRING DIAG:  R29.6 (ICD-10-CM) - Recurrent falls  R26.89 (ICD-10-CM) - Imbalance    THERAPY  DIAG:  Unsteadiness on feet  Other abnormalities of gait and mobility  Muscle weakness (generalized)  Rationale for Evaluation and Treatment: Rehabilitation  SUBJECTIVE:                                                                                                                                                                                             SUBJECTIVE STATEMENT: Hip was a little sore after aquatics yesterday, but its okay now.  Been doing the exercises.     Pt accompanied by: self  PERTINENT HISTORY: plantar fascitis, back pain, hx of back surgery, neck surgery, neuropathy on LLE, falls with 5th metatarsal fracture, vertigo  PAIN:  Are you having pain? Yes: NPRS scale: 1/10 Pain location: LBP and generalized pain Pain description: achy Aggravating factors: walking Relieving factors: time  PRECAUTIONS: Fall  RED FLAGS: None   WEIGHT BEARING RESTRICTIONS: No  FALLS: Has patient fallen in last 6 months? Yes. Number of falls 2  LIVING ENVIRONMENT: Lives with: lives with their spouse and splits time between [place] and [place]  Lives in: House/apartment Stairs: 4 steps to enter and one level in [place]; flight of steps to bedroom in [place] Has following equipment at home: bilateral walking poles  PLOF: Independent  PATIENT GOALS: To try to prevent future falls  OBJECTIVE:    TODAY'S TREATMENT: 11/23/2023 Activity Comments  Gait 20 ft x 4 reps Gait with varied speeds Gait with head turns/head nods   Cues for visual target   FTSTS:  9.66 sec, 8.54 sec   60M walk:  11.53 sec, 10.22 sec (3.2 ft/sec)   DGI:  20/24 Improved from 13/24  Verbal review of HEP No questions, good return demo from previous review  Discussed fall prevention     [facility] PT Assessment - 11/23/23 0001       Standardized Balance Assessment   Standardized Balance Assessment Dynamic Gait Index      Dynamic Gait Index   Level Surface Normal  Change in Gait Speed Normal    Gait with  Horizontal Head Turns Normal    Gait with Vertical Head Turns Normal    Gait and Pivot Turn Mild Impairment    Step Over Obstacle Mild Impairment    Step Around Obstacles Mild Impairment    Steps Mild Impairment    Total Score 20           M-CTSIB  Condition 1: Firm Surface, EO 30 Sec, Normal Sway  Condition 2: Firm Surface, EC 30 Sec, Mild Sway  Condition 3: Foam Surface, EO 30 Sec, Mild Sway  Condition 4: Foam Surface, EC 30 Sec, Mild Sway      HOME EXERCISE PROGRAM Access Code: [email or web address] URL: [email or web address] Date: 11/18/2023 Prepared by: [facility] - Outpatient  Rehab - [facility]  Exercises - Heel Toe Raises with Counter Support  - 1-2 x daily - 7 x weekly - 2 sets - 10 reps - Romberg Stance with Head Rotation  - 1-2 x daily - 7 x weekly - 1 sets - 5-10 reps - Romberg Stance with Head Nods  - 1-2 x daily - 7 x weekly - 1 sets - 5-10 reps - Alternating Step Forward with Support  - 1 x daily - 5 x weekly - 2 sets - 10 reps - Standing Gastroc Stretch at Counter  - 1 x daily - 5 x weekly - 2 sets - 30 sec hold - Carioca with Counter Support  - 1 x daily - 5 x weekly - 2 sets - 6 reps - Romberg Stance with Eyes Closed  - 1 x daily - 7 x weekly - 3 sets - 5 reps - Walking with Head Rotation  - 1 x daily - 7 x weekly - 1 sets - 3 reps        PATIENT EDUCATION: Education details:Progress towards goals, POC and plans for discharge this visit Person educated: Patient Education method: Explanation Education comprehension: verbalized understanding and returned demonstration    Note: Objective measures were completed at Evaluation unless otherwise noted.  DIAGNOSTIC FINDINGS: NA for this episode  COGNITION: Overall cognitive status: Within functional limits for tasks assessed   SENSATION: Light touch: WFL per patient report  POSTURE: rounded shoulders and forward head  LOWER EXTREMITY ROM:     Active  Right Eval Left Eval  Hip flexion     Hip extension    Hip abduction    Hip adduction    Hip internal rotation    Hip external rotation    Knee flexion    Knee extension  -25  Ankle dorsiflexion 15 5  Ankle plantarflexion    Ankle inversion    Ankle eversion     (Blank rows = not tested)  LOWER EXTREMITY MMT:    MMT Right Eval Left Eval  Hip flexion 5 4+  Hip extension    Hip abduction    Hip adduction    Hip internal rotation    Hip external rotation    Knee flexion 5 4  Knee extension 5 4  Ankle dorsiflexion 3+ 3+  Ankle plantarflexion    Ankle inversion    Ankle eversion    (Blank rows = not tested)  TRANSFERS: Sit to stand: SBA  Assistive device utilized: None     Stand to sit: SBA  Assistive device utilized: slowed pace, prefers to use UE support and None      GAIT: Findings: Gait Characteristics: step through pattern, decreased step length- Left,  decreased ankle dorsiflexion- Left, and poor foot clearance- Left, Distance walked: 50 ft, Assistive device utilized:None, Level of assistance: SBA, and Comments: reports decreased knowledge of where L foot is with gait  FUNCTIONAL TESTS:  5 times sit to stand: 25.97 sec hands at knees Timed up and go (TUG): 13.72 10 meter walk test: 13.5 sec = 2.43 ft/sec   M-CTSIB  Condition 1: Firm Surface, EO 30 Sec, Normal Sway  Condition 2: Firm Surface, EC 30 Sec, Moderate Sway  Condition 3: Foam Surface, EO 30 Sec, Moderate Sway  Condition 4: Foam Surface, EC 28.25 Sec, Severe Sway                                                                                                                                TREATMENT DATE: 10/27/2023     GOALS: Goals reviewed with patient? Yes  SHORT TERM GOALS: Target date: 11/25/2023  Pt will be independent with HEP for improved strength, balance, gait. Baseline: Goal status: MET, 11/23/2023  2.  Pt will improve 5x sit<>stand to less than or equal to 18 sec to demonstrate improved functional strength and transfer  efficiency. Baseline: 25.97 sec>8.54 sec 11/23/2023 Goal status: MET, 11/23/2023  3.  Pt will improve Condition 4 on MCTSIB to 30 sec mod sway or less for improved balance. Baseline: 28 sec severe sway>30 sec mild sway 11/23/2023 Goal status: MET, 11/23/2023  LONG TERM GOALS: Target date: 12/10/2023  Pt will be independent with HEP for improved strength, balance, gait. Baseline:  Goal status: MET, 11/23/2023  2.  Pt will improve 5x sit<>stand to less than or equal to 15 sec to demonstrate improved functional strength and transfer efficiency. Baseline: 25.97 sec>8.54 sec 11/23/2023 Goal status: MET, 11/23/2023  3.  Pt will improve gait velocity to at least 2.62 ft/sec for improved gait efficiency and safety. Baseline: 2.4 ft/sec>3.2 ft/sec 11/23/2023 Goal status: MET, 11/23/2023  4.  Pt will improve DGI score to at least 19/24 to decrease fall risk. Baseline: 13>20/24 11/23/2023 Goal status: MET, 11/23/2023   ASSESSMENT:  CLINICAL IMPRESSION: Pt presents today with no new complaints. Skilled PT session focused on assessing goals.  She has improved overall mobility measures and has met all STGs and LTGs.  Pt feels that she has improved balance confidence since beginning of therapy and feels her exercises remain appropriate as part of HEP.  She is appropriate for d/c at this time.  OBJECTIVE IMPAIRMENTS: Abnormal gait, decreased balance, decreased knowledge of use of DME, decreased mobility, difficulty walking, decreased ROM, decreased strength, and impaired flexibility.   ACTIVITY LIMITATIONS: standing, transfers, and locomotion level  PARTICIPATION LIMITATIONS: shopping, community activity, and community fitness and travel  PERSONAL FACTORS: 3+ comorbidities: see PMH are also affecting patient's functional outcome.   REHAB POTENTIAL: Good  CLINICAL DECISION MAKING: Evolving/moderate complexity  EVALUATION COMPLEXITY: Moderate  PLAN:  PT FREQUENCY: 2x/week  PT DURATION: 6 weeks plus eval  visit  PLANNED INTERVENTIONS:  02249- Physical Performance Testing, 97110-Therapeutic exercises, 97530- Therapeutic activity, [id number]- Neuromuscular re-education, [phone]- Self Care, 02883- Gait training, Patient/Family education, and Balance training  PLAN FOR NEXT SESSION: Discharge this visit.  Greig Anon, PT 11/23/23 3:31 PM Phone: [phone] Fax: [phone]  [facility] Outpatient Rehab at [facility] [address] [place], [address] [place], [place] [address] Phone # [phone] Fax # [phone]

## 2023-11-25 ENCOUNTER — Ambulatory Visit: Admitting: Physical Therapy

## 2024-04-06 ENCOUNTER — Ambulatory Visit: Payer: Self-pay | Admitting: Internal Medicine

## 2024-05-02 ENCOUNTER — Ambulatory Visit: Payer: Self-pay | Admitting: Internal Medicine

## 2024-05-02 NOTE — Progress Notes (Deleted)
 Office Visit Note  Patient: Jenny [name]             Date of Birth: December 20, 1945           MRN: [id number]             PCP: Jarold Medici, MD Referring: Jarold Medici, MD Visit Date: 05/16/2024 Occupation: Data Unavailable  Subjective:  No chief complaint on file.   History of Present Illness: Jenny [name] is a 78 y.o. female ***     Activities of Daily Living:  Patient reports morning stiffness for *** {minute/hour:19697}.   Patient {ACTIONS;DENIES/REPORTS:21021675::Denies} nocturnal pain.  Difficulty dressing/grooming: {ACTIONS;DENIES/REPORTS:21021675::Denies} Difficulty climbing stairs: {ACTIONS;DENIES/REPORTS:21021675::Denies} Difficulty getting out of chair: {ACTIONS;DENIES/REPORTS:21021675::Denies} Difficulty using hands for taps, buttons, cutlery, and/or writing: {ACTIONS;DENIES/REPORTS:21021675::Denies}  No Rheumatology ROS completed.   PMFS History:  Patient Active Problem List   Diagnosis Date Noted   History of foot fracture 10/04/2023   Recurrent falls 10/04/2023   Imbalance 10/04/2023   Memory changes 10/04/2023   Grief 04/06/2023   Adjustment insomnia 04/06/2023   Bilateral hand pain 04/06/2023   S/P lumbar fusion 04/11/2020   Pre-op evaluation 03/07/2020   Abnormal EKG 03/07/2020   Blood glucose abnormal 03/21/2019   Cardiovascular stress test abnormal 03/21/2019   Cervical disc disorder 03/21/2019   Disc disorder of lumbar region 03/21/2019   Depressive disorder 03/21/2019   Disorder of bone and articular cartilage 03/21/2019   Hyperlipidemia 03/21/2019   Impaired fasting glucose 03/21/2019   Mammogram abnormal 03/21/2019   Numbness of lower limb 03/21/2019   Osteopenia 03/21/2019   Seborrheic keratosis 03/21/2019   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   Varicose veins of lower extremity 03/21/2019   Vitamin D  deficiency 03/21/2019   Spinal stenosis of lumbar region 01/20/2019   Bursitis of left hip 01/03/2019    Peripheral neuropathic pain 01/03/2019   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   Need for immunization against influenza 03/16/2018   Proteinuria 09/29/2017   Chronic kidney disease, 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   Overweight (BMI 25.0-29.9) 04/12/2017   Preop cardiovascular exam 12/17/2016   Prediabetes 12/17/2016   Obstructive sleep apnea syndrome 04/08/2015    Past Medical History:  Diagnosis Date   Allergy    Anemia    Anxiety    Arthritis    generalized   Bursitis of both hips    Cancer (HCC) 2010   Melanoma Left Arm   Cataract 2016,2017   Chronic kidney disease    Depression    Hyperlipidemia    Hypertension    Neuromuscular disorder (HCC)    Peripheral Nueropathy ( Left Side)   Plantar fasciitis    PONV (postoperative nausea and vomiting)    2014 (per pt, hard time waking up)   Preop cardiovascular exam 12/17/2016   Sleep apnea     Family History  Problem Relation Age of Onset   COPD Mother    Psychosis Mother    Anxiety disorder Mother    Depression Mother    COPD Father    Depression Father    Hypertension Father    Arthritis Father    Hearing loss Father    ADD / ADHD Brother    Other Brother        Leptospirosis   Healthy Brother    Depression Paternal Aunt    Heart disease Paternal Grandmother    Lupus Daughter    Sleep apnea Son    Past Surgical History:  Procedure Laterality Date   BACK SURGERY  05/04/2013   Cervical Discectomy with Fusion by Dr.McGovern   BREAST SURGERY  1980   fibroadenoma   CATARACT EXTRACTION Left 06/14/2015   CATARACT EXTRACTION Right 05/08/2015   CERVICAL SPINE SURGERY  2014   spinal fusion   EYE SURGERY Bilateral    Yag procedure both eyes   MELANOMA EXCISION Left 2010   left arm   TONSILLECTOMY     removed at age 66   TRANSFORAMINAL LUMBAR INTERBODY FUSION (TLIF) WITH PEDICLE SCREW FIXATION 1 LEVEL N/A 04/11/2020   Procedure: TRANSFORAMINAL LUMBAR INTERBODY FUSION (TLIF) L5-S1, L4-5 LEFT  LAMINOTOMY/ DECOMPRESSION;  Surgeon: Burnetta Aures, MD;  Location: [facility] OR;  Service: Orthopedics;  Laterality: N/A;  5 hrs   TUBAL LIGATION     Social History   Tobacco Use   Smoking status: Former    Current packs/day: 0.00    Average packs/day: 0.3 packs/day for 2.0 years (0.5 ttl pk-yrs)    Types: Cigarettes    Start date: 09/27/1966    Quit date: 09/26/1968    Years since quitting: 55.6    Passive exposure: Past   Smokeless tobacco: Never  Vaping Use   Vaping status: Never Used  Substance Use Topics   Alcohol use: Not Currently    Comment: occ   Drug use: Never   Social History   Social History Narrative   Not on file     Immunization History  Administered Date(s) Administered   Fluad Quad(high Dose 65+) 03/02/2022, 04/29/2023   INFLUENZA, HIGH DOSE SEASONAL PF 02/05/2016, 04/06/2017, 03/16/2018   Influenza Split 03/28/2010, 02/10/2011, 05/11/2012   Influenza, Quadrivalent, Recombinant, Inj, Pf 03/20/2019   Influenza, Seasonal, Injecte, Preservative Fre 04/04/2013   Influenza,inj,Quad PF,6+ Mos 04/11/2014, 03/15/2015   PFIZER(Purple Top)SARS-COV-2 Vaccination 07/24/2019, 08/14/2019, 05/14/2020   Pfizer Covid-19 Vaccine Bivalent Booster 68yrs & up 03/17/2021, 04/07/2022   Pfizer(Comirnaty)Fall Seasonal Vaccine 12 years and older 05/28/2023   Pneumococcal Conjugate-13 07/13/2014   Pneumococcal Polysaccharide-23 02/05/2016   Pneumococcal-Unspecified 03/30/2008   RSV,unspecified 06/02/2022   Tdap 05/11/2011, 12/24/2022   Zoster, Live 04/08/2016     Objective: Vital Signs: There were no vitals taken for this visit.   Physical Exam   Musculoskeletal Exam: ***  CDAI Exam: CDAI Score: -- Patient Global: --; Provider Global: -- Swollen: --; Tender: -- Joint Exam 05/16/2024   No joint exam has been documented for this visit   There is currently no information documented on the homunculus. Go to the Rheumatology activity and complete the homunculus joint  exam.  Investigation: No additional findings.  Imaging: No results found.  Recent Labs: Lab Results  Component Value Date   WBC 5.3 10/04/2023   HGB 13.2 10/04/2023   PLT 255 10/04/2023   NA 141 10/04/2023   K 4.8 10/04/2023   CL 104 10/04/2023   CO2 24 10/04/2023   GLUCOSE 88 10/04/2023   BUN 18 10/04/2023   CREATININE 0.98 10/04/2023   BILITOT 0.4 10/04/2023   ALKPHOS 92 10/04/2023   AST 19 10/04/2023   ALT 13 10/04/2023   PROT 6.7 10/04/2023   ALBUMIN 4.4 10/04/2023   CALCIUM  9.3 10/04/2023   GFRAA 70 05/02/2020    Speciality Comments: No specialty comments available.  Procedures:  No procedures performed Allergies: Clindamycin, Prochlorperazine, Ibandronate, Raloxifene, and Diclofenac sodium   Assessment / Plan:     Visit Diagnoses: No diagnosis found.  Orders: No orders of the defined types were placed in this encounter.  No orders of the defined types were placed in this  encounter.   Face-to-face time spent with patient was *** minutes. Greater than 50% of time was spent in counseling and coordination of care.  Follow-Up Instructions: No follow-ups on file.   Maya Nash, MD  Note - This record has been created using Animal nutritionist.  Chart creation errors have been sought, but may not always  have been located. Such creation errors do not reflect on  the standard of medical care.

## 2024-05-15 ENCOUNTER — Encounter: Payer: Self-pay | Admitting: Internal Medicine

## 2024-05-15 ENCOUNTER — Ambulatory Visit: Payer: Self-pay | Admitting: Internal Medicine

## 2024-05-15 VITALS — BP 110/60 | HR 74 | Temp 98.7°F | Ht 64.0 in | Wt 166.2 lb

## 2024-05-15 DIAGNOSIS — Z2821 Immunization not carried out because of patient refusal: Secondary | ICD-10-CM | POA: Diagnosis not present

## 2024-05-15 DIAGNOSIS — R7303 Prediabetes: Secondary | ICD-10-CM | POA: Diagnosis not present

## 2024-05-15 DIAGNOSIS — E7800 Pure hypercholesterolemia, unspecified: Secondary | ICD-10-CM | POA: Diagnosis not present

## 2024-05-15 DIAGNOSIS — E559 Vitamin D deficiency, unspecified: Secondary | ICD-10-CM | POA: Diagnosis not present

## 2024-05-15 DIAGNOSIS — F419 Anxiety disorder, unspecified: Secondary | ICD-10-CM

## 2024-05-15 MED ORDER — PAROXETINE HCL 30 MG PO TABS: 30.0000 mg | ORAL_TABLET | Freq: Every day | ORAL | 2 refills | Status: AC

## 2024-05-15 NOTE — Progress Notes (Signed)
 I,Jenny [name], CMA,acting as a neurosurgeon for Jenny LOISE Slocumb, MD.,have documented all relevant documentation on the behalf of Jenny LOISE Slocumb, MD,as directed by  Jenny LOISE Slocumb, MD while in the presence of Jenny LOISE Slocumb, MD.  Subjective:  Patient ID: Jenny [name] , female    DOB: [birth year] , 78 y.o.   MRN: [id number]  Chief Complaint  Patient presents with   Hyperlipidemia    Patient presents today for a chol and pre dm follow up, Patient reports compliance with medication. Patient denies any chest pain, SOB, or headaches. Patient has no concerns today.     HPI Discussed the use of AI scribe software for clinical note transcription with the patient, who gave verbal consent to proceed.  History of Present Illness Jenny [name] is a 78 year old female who presents for a cholesterol check and management of anxiety.  She experiences significant anxiety over minor issues, such as being late for appointments or dinner with her son. She attributes some of her anxiety to her husband's longstanding punctuality issues. She has tried meditation and reasoning with herself but finds it difficult to control her anxiety.  She has been taking Paxil  for anxiety, previously at 30 mg, but due to a pharmacy error, she has been on 20 mg for the past three months. The medication is not effective at the current dose. She has been on Paxil  for approximately 25 years. She tried magnesium  supplements but did not find them helpful and discontinued them.  She experienced a fall on October 11th, 2025, after taking codeine post-dental extraction. She was admitted to the hospital and discharged on October 14th, 2025. She does not recall the fall but was found on the floor by her husband. She has a history of orthostatic hypotension. She has been using thigh-high compression stockings intermittently.  She exercises regularly at the [facility] three times a week but finds it difficult to maintain this routine when in  [place]  due to the different environment. She enjoys the social aspect of exercising at her local [employer].  She has a history of orthostatic hypotension and experiences frequent urination, especially at night.   Past Medical History:  Diagnosis Date   Allergy    Anemia    Anxiety    Arthritis    generalized   Bursitis of both hips    Cancer (HCC) 2010   Melanoma Left Arm   Cataract 2016,2017   Chronic kidney disease    Depression    Hyperlipidemia    Hypertension    Neuromuscular disorder (HCC)    Peripheral Nueropathy ( Left Side)   Plantar fasciitis    PONV (postoperative nausea and vomiting)    2014 (per pt, hard time waking up)   Preop cardiovascular exam 12/17/2016   Sleep apnea      Family History  Problem Relation Age of Onset   COPD Mother    Psychosis Mother    Anxiety disorder Mother    Depression Mother    COPD Father    Depression Father    Hypertension Father    Arthritis Father    Hearing loss Father    ADD / ADHD Brother    Other Brother        Leptospirosis   Healthy Brother    Depression Paternal Aunt    Heart disease Paternal Grandmother    Lupus Daughter    Sleep apnea Son      Current Outpatient Medications:    acetaminophen  (TYLENOL ) 500  MG tablet, Take 500 mg by mouth every morning., Disp: , Rfl:    Alpha-D-Galactosidase (BEANO PO), Take by mouth. prn, Disp: , Rfl:    atorvastatin  (LIPITOR) 10 MG tablet, TAKE 1 TABLET BY MOUTH EVERY DAY, Disp: 90 tablet, Rfl: 2   Calcium -Vitamin D -Vitamin K (CALCIUM  SOFT CHEWS PO), Take by mouth., Disp: , Rfl:    Cholecalciferol (VITAMIN D -3) 25 MCG (1000 UT) CAPS, Take by mouth., Disp: , Rfl:    CLINPRO 5000 1.1 % PSTE, SMARTSIG:sparingly To Teeth, Disp: , Rfl:    Cyanocobalamin  (VITAMIN B12) 1000 MCG TABS, , Disp: , Rfl:    diphenhydrAMINE (BENADRYL) 25 mg capsule, Take 25 mg by mouth at bedtime as needed for sleep. , Disp: , Rfl:    Docusate Sodium (COLACE PO), 1 capsule at bedtime., Disp: , Rfl:     Lactase (LACTAID PO), Take by mouth. prn, Disp: , Rfl:    PARoxetine  (PAXIL ) 30 MG tablet, Take 1 tablet (30 mg total) by mouth daily., Disp: 90 tablet, Rfl: 2   Allergies  Allergen Reactions   Clindamycin Hives, Itching and Rash   Prochlorperazine Palpitations    Rapid heart rate, insomnia, made her super hyper   Ibandronate     Lower Jaw Pain   Raloxifene     Lower Jaw Pain   Diclofenac Sodium Other (See Comments)    Turns feces white     Review of Systems  Constitutional: Negative.   Respiratory: Negative.    Cardiovascular: Negative.   Gastrointestinal: Negative.   Neurological: Negative.   Psychiatric/Behavioral:  The patient is nervous/anxious.      Today's Vitals   05/15/24 1526  BP: 110/60  Pulse: 74  Temp: 98.7 F (37.1 C)  TempSrc: Oral  Weight: 166 lb 3.2 oz (75.4 kg)  Height: 5' 4 (1.626 m)  PainSc: 0-No pain   Body mass index is 28.53 kg/m.  Wt Readings from Last 3 Encounters:  05/15/24 166 lb 3.2 oz (75.4 kg)  11/05/23 166 lb (75.3 kg)  10/05/23 167 lb 3.2 oz (75.8 kg)    The 10-year ASCVD risk score (Arnett DK, et al., 2019) is: 17.3%   Values used to calculate the score:     Age: 63 years     Clinically relevant sex: Female     Is Non-Hispanic African American: No     Diabetic: No     Tobacco smoker: No     Systolic Blood Pressure: 110 mmHg     Is BP treated: No     HDL Cholesterol: 71 mg/dL     Total Cholesterol: 176 mg/dL  Objective:  Physical Exam Vitals and nursing note reviewed.  Constitutional:      Appearance: Normal appearance.  HENT:     Head: Normocephalic and atraumatic.  Eyes:     Extraocular Movements: Extraocular movements intact.  Cardiovascular:     Rate and Rhythm: Normal rate and regular rhythm.     Heart sounds: Normal heart sounds.  Pulmonary:     Effort: Pulmonary effort is normal.     Breath sounds: Normal breath sounds.  Skin:    General: Skin is warm.  Neurological:     General: No focal deficit  present.     Mental Status: She is alert.  Psychiatric:        Mood and Affect: Mood normal.        Behavior: Behavior normal.      Assessment And Plan:   Assessment & Plan Pure hypercholesterolemia  Chronic, currently on atorvastatin  10mg  daily. Encouraged to follow heart healthy lifestyle.  Prediabetes Previous labs reviewed, her A1c has been elevated in the past. I will check an A1c today. Reminded to avoid refined sugars including sugary drinks/foods and processed meats including bacon, sausages and deli meats.   Anxiety Increased anxiety with ineffective Paxil  treatment. Unsuccessful non-pharmacological interventions. Not interested in therapy. - Increased Paxil  to 30 mg daily. - Recommended magnesium  glycinate nightly. - Scheduled referral to in-house therapist for evaluation and potential psychiatric consultation. - Ordered liver and kidney function tests, including calcium  and potassium levels. Herpes zoster vaccination declined  Vitamin D  deficiency Currently taking 2000IU vitamin D3 daily.  - I WILL CHECK A VIT D LEVEL AND SUPPLEMENT AS NEEDED.  ALSO ENCOURAGED TO SPEND 15 MINUTES IN THE SUN DAILY.  General Health Maintenance Discussed shingles vaccination and hydration strategies. - Advised to receive first shingles vaccine dose in [place] , with second dose 2-6 months apart. - Advised to stay hydrated and consume salty foods if experiencing orthostatic hypotension.   Orders Placed This Encounter  Procedures   Lipid panel   Hemoglobin A1c   Magnesium    CMP14+EGFR   Vitamin D  (25 hydroxy)   Ambulatory referral to Integrated Behavioral Health     Return for 6 month chol check.  Patient was given opportunity to ask questions. Patient verbalized understanding of the plan and was able to repeat key elements of the plan. All questions were answered to their satisfaction.   Patient and/or legal guardian verbally consented to [facility]  services about presenting concerns and psychiatric consultation as appropriate.  The services will be billed as appropriate for the patient  I, Jenny LOISE Slocumb, MD, have reviewed all documentation for this visit. The documentation on 05/20/2024 for the exam, diagnosis, procedures, and orders are all accurate and complete.   IF YOU HAVE BEEN REFERRED TO A SPECIALIST, IT MAY TAKE 1-2 WEEKS TO SCHEDULE/PROCESS THE REFERRAL. IF YOU HAVE NOT HEARD FROM US /SPECIALIST IN TWO WEEKS, PLEASE GIVE US  A CALL AT [phone].

## 2024-05-15 NOTE — Patient Instructions (Signed)
 Pure Encapsulations Magnesium  glycinate   Generalized Anxiety Disorder, Adult Generalized anxiety disorder (GAD) is a mental health condition. Unlike normal worries, anxiety related to GAD is not triggered by a specific event. These worries do not fade or get better with time. GAD interferes with relationships, work, and school. GAD symptoms can vary from mild to severe. People with severe GAD can have intense waves of anxiety with physical symptoms that are similar to panic attacks. What are the causes? The exact cause of GAD is not known, but the following are believed to have an impact: Differences in natural brain chemicals. Genes passed down from parents to children. Differences in the way threats are perceived. Development and stress during childhood. Personality. What increases the risk? The following factors may make you more likely to develop this condition: Being female. Having a family history of anxiety disorders. Being very shy. Experiencing very stressful life events, such as the death of a loved one. Having a very stressful family environment. What are the signs or symptoms? People with GAD often worry excessively about many things in their lives, such as their health and family. Symptoms may also include: Mental and emotional symptoms: Worrying excessively about natural disasters. Fear of being late. Difficulty concentrating. Fears that others are judging your performance. Physical symptoms: Fatigue. Headaches, muscle tension, muscle twitches, trembling, or feeling shaky. Feeling like your heart is pounding or beating very fast. Feeling out of breath or like you cannot take a deep breath. Having trouble falling asleep or staying asleep, or experiencing restlessness. Sweating. Nausea, diarrhea, or irritable bowel syndrome (IBS). Behavioral symptoms: Experiencing erratic moods or irritability. Avoidance of new situations. Avoidance of people. Extreme difficulty  making decisions. How is this diagnosed? This condition is diagnosed based on your symptoms and medical history. You will also have a physical exam. Your health care provider may perform tests to rule out other possible causes of your symptoms. To be diagnosed with GAD, a person must have anxiety that: Is out of his or her control. Affects several different aspects of his or her life, such as work and relationships. Causes distress that makes him or her unable to take part in normal activities. Includes at least three symptoms of GAD, such as restlessness, fatigue, trouble concentrating, irritability, muscle tension, or sleep problems. Before your health care provider can confirm a diagnosis of GAD, these symptoms must be present more days than they are not, and they must last for 6 months or longer. How is this treated? This condition may be treated with: Medicine. Antidepressant medicine is usually prescribed for long-term daily control. Anti-anxiety medicines may be added in severe cases, especially when panic attacks occur. Talk therapy (psychotherapy). Certain types of talk therapy can be helpful in treating GAD by providing support, education, and guidance. Options include: Cognitive behavioral therapy (CBT). People learn coping skills and self-calming techniques to ease their physical symptoms. They learn to identify unrealistic thoughts and behaviors and to replace them with more appropriate thoughts and behaviors. Acceptance and commitment therapy (ACT). This treatment teaches people how to be mindful as a way to cope with unwanted thoughts and feelings. Biofeedback. This process trains you to manage your body's response (physiological response) through breathing techniques and relaxation methods. You will work with a therapist while machines are used to monitor your physical symptoms. Stress management techniques. These include yoga, meditation, and exercise. A mental health specialist can  help determine which treatment is best for you. Some people see improvement with one type  of therapy. However, other people require a combination of therapies. Follow these instructions at home: Lifestyle Maintain a consistent routine and schedule. Anticipate stressful situations. Create a plan and allow extra time to work with your plan. Practice stress management or self-calming techniques that you have learned from your therapist or your health care provider. Exercise regularly and spend time outdoors. Eat a healthy diet that includes plenty of vegetables, fruits, whole grains, low-fat dairy products, and lean protein. Do not eat a lot of foods that are high in fat, added sugar, or salt (sodium). Drink plenty of water. Avoid alcohol. Alcohol can increase anxiety. Avoid caffeine and certain over-the-counter cold medicines. These may make you feel worse. Ask your pharmacist which medicines to avoid. General instructions Take over-the-counter and prescription medicines only as told by your health care provider. Understand that you are likely to have setbacks. Accept this and be kind to yourself as you persist to take better care of yourself. Anticipate stressful situations. Create a plan and allow extra time to work with your plan. Recognize and accept your accomplishments, even if you judge them as small. Spend time with people who care about you. Keep all follow-up visits. This is important. Where to find more information [employer] of Mental Health: http://www.maynard.net/ Substance Abuse and Mental Health Services: skateoasis.com.pt Contact a health care provider if: Your symptoms do not get better. Your symptoms get worse. You have signs of depression, such as: A persistently sad or irritable mood. Loss of enjoyment in activities that used to bring you joy. Change in weight or eating. Changes in sleeping habits. Get help right away if: You have thoughts about hurting yourself or  others. If you ever feel like you may hurt yourself or others, or have thoughts about taking your own life, get help right away. Go to your nearest emergency department or: Call your local emergency services (911 in the [place] Call a suicide crisis helpline, such as the National Suicide Prevention Lifeline at 737-297-6554 or 988 in the [place] This is open 24 hours a day in the [place] If youre a Veteran: Call 988 and press 1. This is open 24 hours a day. Text the [employer] at 314-332-4498. Summary Generalized anxiety disorder (GAD) is a mental health condition that involves worry that is not triggered by a specific event. People with GAD often worry excessively about many things in their lives, such as their health and family. GAD may cause symptoms such as restlessness, trouble concentrating, sleep problems, frequent sweating, nausea, diarrhea, headaches, and trembling or muscle twitching. A mental health specialist can help determine which treatment is best for you. Some people see improvement with one type of therapy. However, other people require a combination of therapies. This information is not intended to replace advice given to you by your health care provider. Make sure you discuss any questions you have with your health care provider. Document Revised: 12/24/2022 Document Reviewed: 09/01/2020 Elsevier Patient Education  2024 [employer].

## 2024-05-15 NOTE — Assessment & Plan Note (Addendum)
 Previous labs reviewed, her A1c has been elevated in the past. I will check an A1c today. Reminded to avoid refined sugars including sugary drinks/foods and processed meats including bacon, sausages and deli meats.

## 2024-05-15 NOTE — Assessment & Plan Note (Addendum)
 Currently taking 2000IU vitamin D3 daily.  - I WILL CHECK A VIT D LEVEL AND SUPPLEMENT AS NEEDED.  ALSO ENCOURAGED TO SPEND 15 MINUTES IN THE SUN DAILY.

## 2024-05-16 ENCOUNTER — Ambulatory Visit: Admitting: Rheumatology

## 2024-05-16 DIAGNOSIS — M5030 Other cervical disc degeneration, unspecified cervical region: Secondary | ICD-10-CM

## 2024-05-16 DIAGNOSIS — M19041 Primary osteoarthritis, right hand: Secondary | ICD-10-CM

## 2024-05-16 DIAGNOSIS — M7061 Trochanteric bursitis, right hip: Secondary | ICD-10-CM

## 2024-05-16 DIAGNOSIS — M2241 Chondromalacia patellae, right knee: Secondary | ICD-10-CM

## 2024-05-16 DIAGNOSIS — N182 Chronic kidney disease, stage 2 (mild): Secondary | ICD-10-CM

## 2024-05-16 DIAGNOSIS — E559 Vitamin D deficiency, unspecified: Secondary | ICD-10-CM

## 2024-05-16 DIAGNOSIS — M792 Neuralgia and neuritis, unspecified: Secondary | ICD-10-CM

## 2024-05-16 DIAGNOSIS — F32A Depression, unspecified: Secondary | ICD-10-CM

## 2024-05-16 DIAGNOSIS — R2689 Other abnormalities of gait and mobility: Secondary | ICD-10-CM

## 2024-05-16 DIAGNOSIS — M519 Unspecified thoracic, thoracolumbar and lumbosacral intervertebral disc disorder: Secondary | ICD-10-CM

## 2024-05-16 DIAGNOSIS — R7681 Abnormal rheumatoid factor and anti-citrullinated protein antibody without rheumatoid arthritis: Secondary | ICD-10-CM

## 2024-05-16 DIAGNOSIS — Z8269 Family history of other diseases of the musculoskeletal system and connective tissue: Secondary | ICD-10-CM

## 2024-05-16 DIAGNOSIS — L821 Other seborrheic keratosis: Secondary | ICD-10-CM

## 2024-05-16 DIAGNOSIS — R7303 Prediabetes: Secondary | ICD-10-CM

## 2024-05-16 DIAGNOSIS — G4733 Obstructive sleep apnea (adult) (pediatric): Secondary | ICD-10-CM

## 2024-05-16 DIAGNOSIS — M8589 Other specified disorders of bone density and structure, multiple sites: Secondary | ICD-10-CM

## 2024-05-16 DIAGNOSIS — Z87891 Personal history of nicotine dependence: Secondary | ICD-10-CM

## 2024-05-16 DIAGNOSIS — E7800 Pure hypercholesterolemia, unspecified: Secondary | ICD-10-CM

## 2024-05-16 DIAGNOSIS — M160 Bilateral primary osteoarthritis of hip: Secondary | ICD-10-CM

## 2024-05-16 LAB — LIPID PANEL
Chol/HDL Ratio: 2.5 ratio (ref 0.0–4.4)
Cholesterol, Total: 176 mg/dL (ref 100–199)
HDL: 71 mg/dL
LDL Chol Calc (NIH): 85 mg/dL (ref 0–99)
Triglycerides: 116 mg/dL (ref 0–149)
VLDL Cholesterol Cal: 20 mg/dL (ref 5–40)

## 2024-05-16 LAB — VITAMIN D 25 HYDROXY (VIT D DEFICIENCY, FRACTURES): Vit D, 25-Hydroxy: 42.8 ng/mL (ref 30.0–100.0)

## 2024-05-16 LAB — HEMOGLOBIN A1C
Est. average glucose Bld gHb Est-mCnc: 126 mg/dL
Hgb A1c MFr Bld: 6 % — ABNORMAL HIGH (ref 4.8–5.6)

## 2024-05-16 LAB — CMP14+EGFR
ALT: 18 IU/L (ref 0–32)
AST: 22 IU/L (ref 0–40)
Albumin: 4.2 g/dL (ref 3.8–4.8)
Alkaline Phosphatase: 97 IU/L (ref 49–135)
BUN/Creatinine Ratio: 26 (ref 12–28)
BUN: 24 mg/dL (ref 8–27)
Bilirubin Total: <0.2 mg/dL (ref 0.0–1.2)
CO2: 23 mmol/L (ref 20–29)
Calcium: 9.4 mg/dL (ref 8.7–10.3)
Chloride: 105 mmol/L (ref 96–106)
Creatinine, Ser: 0.91 mg/dL (ref 0.57–1.00)
Globulin, Total: 2.4 g/dL (ref 1.5–4.5)
Glucose: 105 mg/dL — ABNORMAL HIGH (ref 70–99)
Potassium: 4.2 mmol/L (ref 3.5–5.2)
Sodium: 140 mmol/L (ref 134–144)
Total Protein: 6.6 g/dL (ref 6.0–8.5)
eGFR: 65 mL/min/1.73

## 2024-05-16 LAB — MAGNESIUM: Magnesium: 2.2 mg/dL (ref 1.6–2.3)

## 2024-05-17 ENCOUNTER — Ambulatory Visit: Payer: Self-pay | Admitting: Internal Medicine

## 2024-05-20 NOTE — Assessment & Plan Note (Signed)
 Increased anxiety with ineffective Paxil  treatment. Unsuccessful non-pharmacological interventions. Not interested in therapy. - Increased Paxil  to 30 mg daily. - Recommended magnesium  glycinate nightly. - Scheduled referral to in-house therapist for evaluation and potential psychiatric consultation. - Ordered liver and kidney function tests, including calcium  and potassium levels.

## 2024-05-20 NOTE — Assessment & Plan Note (Signed)
 Chronic, currently on atorvastatin 10mg  daily. Encouraged to follow heart healthy lifestyle.

## 2024-07-21 ENCOUNTER — Ambulatory Visit: Admitting: Physician Assistant

## 2024-08-09 ENCOUNTER — Ambulatory Visit: Payer: Medicare Other

## 2024-11-13 ENCOUNTER — Ambulatory Visit: Payer: Self-pay | Admitting: Internal Medicine
